# Patient Record
Sex: Female | Born: 1940
Health system: Southern US, Community
[De-identification: ages and names within clinical notes are randomized; demographics above are authoritative.]

## PROBLEM LIST (undated history)

## (undated) DIAGNOSIS — I10 Essential (primary) hypertension: Secondary | ICD-10-CM

## (undated) DIAGNOSIS — E039 Hypothyroidism, unspecified: Secondary | ICD-10-CM

## (undated) DIAGNOSIS — K7581 Nonalcoholic steatohepatitis (NASH): Secondary | ICD-10-CM

## (undated) DIAGNOSIS — K7460 Unspecified cirrhosis of liver: Secondary | ICD-10-CM

## (undated) DIAGNOSIS — C50911 Malignant neoplasm of unspecified site of right female breast: Secondary | ICD-10-CM

## (undated) DIAGNOSIS — E079 Disorder of thyroid, unspecified: Secondary | ICD-10-CM

## (undated) DIAGNOSIS — Z8619 Personal history of other infectious and parasitic diseases: Secondary | ICD-10-CM

## (undated) DIAGNOSIS — R0600 Dyspnea, unspecified: Secondary | ICD-10-CM

## (undated) DIAGNOSIS — K219 Gastro-esophageal reflux disease without esophagitis: Secondary | ICD-10-CM

## (undated) DIAGNOSIS — M1990 Unspecified osteoarthritis, unspecified site: Secondary | ICD-10-CM

## (undated) DIAGNOSIS — E119 Type 2 diabetes mellitus without complications: Secondary | ICD-10-CM

## (undated) DIAGNOSIS — C220 Liver cell carcinoma: Secondary | ICD-10-CM

## (undated) DIAGNOSIS — E785 Hyperlipidemia, unspecified: Secondary | ICD-10-CM

## (undated) DIAGNOSIS — Z9289 Personal history of other medical treatment: Secondary | ICD-10-CM

## (undated) HISTORY — PX: SKIN CANCER EXCISION: SHX779

## (undated) HISTORY — PX: CATARACT EXTRACTION W/ INTRAOCULAR LENS  IMPLANT, BILATERAL: SHX1307

## (undated) HISTORY — PX: OTHER SURGICAL HISTORY: SHX169

## (undated) HISTORY — DX: Personal history of other infectious and parasitic diseases: Z86.19

## (undated) HISTORY — DX: Disorder of thyroid, unspecified: E07.9

## (undated) HISTORY — DX: Hyperlipidemia, unspecified: E78.5

## (undated) HISTORY — DX: Unspecified osteoarthritis, unspecified site: M19.90

## (undated) HISTORY — DX: Essential (primary) hypertension: I10

---

## 1985-02-14 HISTORY — PX: ABDOMINAL HYSTERECTOMY: SHX81

## 2002-02-14 HISTORY — PX: HERNIA REPAIR: SHX51

## 2003-02-15 DIAGNOSIS — C50911 Malignant neoplasm of unspecified site of right female breast: Secondary | ICD-10-CM

## 2003-02-15 HISTORY — PX: BREAST BIOPSY: SHX20

## 2003-02-15 HISTORY — DX: Malignant neoplasm of unspecified site of right female breast: C50.911

## 2003-02-15 HISTORY — PX: BREAST LUMPECTOMY: SHX2

## 2004-01-23 ENCOUNTER — Ambulatory Visit: Admission: RE | Admit: 2004-01-23 | Discharge: 2004-04-05 | Payer: Self-pay | Admitting: Radiation Oncology

## 2010-05-31 ENCOUNTER — Ambulatory Visit (INDEPENDENT_AMBULATORY_CARE_PROVIDER_SITE_OTHER): Payer: Medicare Other | Admitting: Internal Medicine

## 2010-05-31 DIAGNOSIS — K7689 Other specified diseases of liver: Secondary | ICD-10-CM

## 2010-05-31 DIAGNOSIS — B182 Chronic viral hepatitis C: Secondary | ICD-10-CM

## 2010-06-21 ENCOUNTER — Ambulatory Visit (INDEPENDENT_AMBULATORY_CARE_PROVIDER_SITE_OTHER): Payer: Medicare Other | Admitting: Internal Medicine

## 2010-06-21 DIAGNOSIS — R894 Abnormal immunological findings in specimens from other organs, systems and tissues: Secondary | ICD-10-CM

## 2010-06-21 DIAGNOSIS — K7689 Other specified diseases of liver: Secondary | ICD-10-CM

## 2010-06-22 ENCOUNTER — Other Ambulatory Visit (INDEPENDENT_AMBULATORY_CARE_PROVIDER_SITE_OTHER): Payer: Self-pay | Admitting: Internal Medicine

## 2010-06-22 DIAGNOSIS — K7460 Unspecified cirrhosis of liver: Secondary | ICD-10-CM

## 2010-07-05 ENCOUNTER — Ambulatory Visit (HOSPITAL_COMMUNITY)
Admission: RE | Admit: 2010-07-05 | Discharge: 2010-07-05 | Disposition: A | Discharge status: Home / Self Care | Payer: Medicare Other | Source: Ambulatory Visit | Attending: Internal Medicine | Admitting: Internal Medicine

## 2010-07-05 ENCOUNTER — Other Ambulatory Visit (INDEPENDENT_AMBULATORY_CARE_PROVIDER_SITE_OTHER): Payer: Self-pay | Admitting: Internal Medicine

## 2010-07-05 ENCOUNTER — Ambulatory Visit (HOSPITAL_COMMUNITY): Payer: Medicare Other

## 2010-07-05 ENCOUNTER — Other Ambulatory Visit: Payer: Self-pay | Admitting: Diagnostic Radiology

## 2010-07-05 DIAGNOSIS — K7460 Unspecified cirrhosis of liver: Secondary | ICD-10-CM

## 2010-07-05 DIAGNOSIS — Z90710 Acquired absence of both cervix and uterus: Secondary | ICD-10-CM | POA: Insufficient documentation

## 2010-07-05 DIAGNOSIS — C50919 Malignant neoplasm of unspecified site of unspecified female breast: Secondary | ICD-10-CM | POA: Insufficient documentation

## 2010-07-05 DIAGNOSIS — E039 Hypothyroidism, unspecified: Secondary | ICD-10-CM | POA: Insufficient documentation

## 2010-07-05 DIAGNOSIS — E119 Type 2 diabetes mellitus without complications: Secondary | ICD-10-CM | POA: Insufficient documentation

## 2010-07-05 DIAGNOSIS — I10 Essential (primary) hypertension: Secondary | ICD-10-CM | POA: Insufficient documentation

## 2010-07-05 LAB — CBC
HCT: 37.8 % (ref 36.0–46.0)
Platelets: 91 10*3/uL — ABNORMAL LOW (ref 150–400)
RBC: 4.33 MIL/uL (ref 3.87–5.11)
RDW: 14.6 % (ref 11.5–15.5)
WBC: 3.7 10*3/uL — ABNORMAL LOW (ref 4.0–10.5)

## 2010-07-05 LAB — PROTIME-INR: INR: 1.06 (ref 0.00–1.49)

## 2010-07-05 LAB — APTT: aPTT: 31 seconds (ref 24–37)

## 2010-10-14 ENCOUNTER — Encounter (INDEPENDENT_AMBULATORY_CARE_PROVIDER_SITE_OTHER): Payer: Self-pay | Admitting: *Deleted

## 2010-11-25 ENCOUNTER — Encounter (INDEPENDENT_AMBULATORY_CARE_PROVIDER_SITE_OTHER): Payer: Self-pay | Admitting: *Deleted

## 2010-12-09 DIAGNOSIS — R079 Chest pain, unspecified: Secondary | ICD-10-CM

## 2010-12-21 ENCOUNTER — Ambulatory Visit (INDEPENDENT_AMBULATORY_CARE_PROVIDER_SITE_OTHER): Payer: Medicare Other | Admitting: Internal Medicine

## 2011-01-10 ENCOUNTER — Ambulatory Visit: Payer: Medicare Other | Admitting: Endocrinology

## 2011-01-11 ENCOUNTER — Ambulatory Visit (INDEPENDENT_AMBULATORY_CARE_PROVIDER_SITE_OTHER): Payer: Medicare Other | Admitting: Endocrinology

## 2011-01-11 ENCOUNTER — Encounter: Payer: Self-pay | Admitting: Endocrinology

## 2011-01-11 VITALS — BP 138/82 | HR 79 | Temp 98.6°F | Ht 62.0 in | Wt 234.1 lb

## 2011-01-11 DIAGNOSIS — E0590 Thyrotoxicosis, unspecified without thyrotoxic crisis or storm: Secondary | ICD-10-CM | POA: Insufficient documentation

## 2011-01-11 NOTE — Progress Notes (Signed)
  Subjective:    Patient ID: Cheryl Velasquez, female    DOB: 1940-06-29, 70 y.o.   MRN: [id number]  HPI Pt says she took synthroid for an uncertain reason for many years, up until a few mos ago.  She says that after being off synthroid x a few mos, her blood test was "0.37."   Symptomatically, she reports many year of slight tremor of the hands, and assoc anxiety. No past medical history on file.  No past surgical history on file.  History   Social History  . Marital Status: Single    Spouse Name: N/A    Number of Children: N/A  . Years of Education: N/A   Occupational History  . Not on file.   Social History Main Topics  . Smoking status: Never Smoker   . Smokeless tobacco: Not on file  . Alcohol Use: Not on file  . Drug Use: Not on file  . Sexually Active: Not on file   Other Topics Concern  . Not on file   Social History Narrative  . No narrative on file    No current outpatient prescriptions on file prior to visit.    Allergies not on file  No family history on file. Mother and 2 sibs had goiters (benign) BP 138/82  Pulse 79  Temp(Src) 98.6 F (37 C) (Oral)  Ht 5' 2"  (1.575 m)  Wt 234 lb 1.9 oz (106.196 kg)  BMI 42.82 kg/m2  SpO2 95%    Review of Systems denies weight loss, headache, hoarseness, double vision, palpitations, diarrhea, polyuria, myalgias, excessive diaphoresis, numbness, seizure, depression, hypoglycemia, and rhinorrhea.  She reports easy bruising and doe    Objective:   Physical Exam VS: see vs page GEN: no distress.  obese HEAD: head: no deformity eyes: no periorbital swelling, no proptosis external nose and ears are normal mouth: no lesion seen NECK: i am unable to detect any thyroid enlargement.   CHEST WALL: no deformity LUNGS:  Clear to auscultation CV: reg rate and rhythm, no murmur ABD: abdomen is soft, nontender.  no hepatosplenomegaly.  not distended.  no hernia MUSCULOSKELETAL: muscle bulk and strength are grossly normal.   no obvious joint swelling.  gait is normal and steady EXTEMITIES: no deformity of the hand. Trace bilat leg edema PULSES: dorsalis pedis intact bilat.   NEURO:  cn 2-12 grossly intact.   readily moves all 4's.  sensation is intact to touch on the feet.  There is a moderate tremor of the hands. SKIN:  Normal texture and temperature.  No rash or suspicious lesion is visible.   NODES:  None palpable at the neck. PSYCH: alert, oriented x3.  Does not appear anxious nor depressed.      Assessment & Plan:  Multinodular goiter, which is usually hereditary Mild hyperthyroidism, due to the goiter, new.   Tremor, unlikely thyroid-related.

## 2011-01-11 NOTE — Patient Instructions (Signed)
You have an hereditary goiter, which has become mildly overactive.  There is nothing you did to cause this, and it does not go away on its own.  The overactive thyroid increases your risk of atrial fibrillation, osteoporosis, and symptoms (which we listed today).   Let's check a thyroid ultrasound.  you will receive a phone call, about a day and time for an appointment. Based on the results, i hope to order for you a treatment pill of radioactive iodine.  Although it is a larger amount of radiation, you will again notice no symptoms from this.  The pill is gone from your body in a few days (during which you should stay away from other people), but takes several months to work.  Therefore, please return here approximately 6-8 weeks after the treatment.  This treatment has been available for many years, and the only known side-effect is an underactive thyroid.  It is possible that i would eventually prescribe for you a thyroid hormone pill, which is very inexpensive.  You don't have to worry about side-effects of this thyroid hormone pill, because it is the same molecule your thyroid makes.

## 2011-01-12 ENCOUNTER — Encounter: Payer: Self-pay | Admitting: Endocrinology

## 2011-01-12 ENCOUNTER — Telehealth: Payer: Self-pay | Admitting: Endocrinology

## 2011-01-12 DIAGNOSIS — I10 Essential (primary) hypertension: Secondary | ICD-10-CM | POA: Insufficient documentation

## 2011-01-12 DIAGNOSIS — M1990 Unspecified osteoarthritis, unspecified site: Secondary | ICD-10-CM | POA: Insufficient documentation

## 2011-01-12 DIAGNOSIS — E119 Type 2 diabetes mellitus without complications: Secondary | ICD-10-CM | POA: Insufficient documentation

## 2011-01-12 DIAGNOSIS — E785 Hyperlipidemia, unspecified: Secondary | ICD-10-CM | POA: Insufficient documentation

## 2011-01-12 NOTE — Telephone Encounter (Signed)
Received 9 pages from [facility] forwarded to Dr. Loanne Drilling for review 11.28.12 sj

## 2011-01-12 NOTE — Telephone Encounter (Signed)
Received 1 page from [facility]. Forwarded to Dr. Loanne Drilling for review. 01/12/11-ar

## 2011-01-17 ENCOUNTER — Encounter (INDEPENDENT_AMBULATORY_CARE_PROVIDER_SITE_OTHER): Payer: Self-pay | Admitting: Internal Medicine

## 2011-01-17 ENCOUNTER — Ambulatory Visit (INDEPENDENT_AMBULATORY_CARE_PROVIDER_SITE_OTHER): Payer: Medicare Other | Admitting: Internal Medicine

## 2011-01-17 VITALS — BP 130/82 | HR 78 | Temp 98.1°F | Resp 16 | Ht 62.0 in | Wt 235.0 lb

## 2011-01-17 DIAGNOSIS — K7460 Unspecified cirrhosis of liver: Secondary | ICD-10-CM

## 2011-01-17 NOTE — Progress Notes (Signed)
Presenting complaint; Followup for recently diagnosed cirrhosis. Subjective: Cheryl Velasquez is 70 year old Caucasian female was evaluated back in April for positive HCV antibody and CT suggesting cirrhosis. Her HCVRNA was undetectable implying either false positive HCV antibody or prior infection with spontaneous clearence. She had liver biopsy which revealed stage IV fibrosis. Changes were felt to be most consistent with cirrhosis due. to NAFLD. Patient was advised to get better handle on her diabetes, lose weight and exercise regularly. She states she has changed her eating habits and trying to be more active physically. He has lost 11 pounds since her last visit. She states she recently had exercise tolerance test and it was normal. She was recently diagnosed with toxic goiter by Dr. Renato Shin and scheduled to undergo ablative therapy with radioiodine in the next 2 weeks. She denies abdominal pain or problems with fluid retention. He states her hemoglobin A1c was down from 9.2 to around 8. Current Medications: Current Outpatient Prescriptions  Medication Sig Dispense Refill  . ALPRAZolam (XANAX) 0.5 MG tablet Take 0.5 mg by mouth daily as needed.        Marland Kitchen aspirin (BAYER LOW DOSE) 81 MG EC tablet Take 81 mg by mouth daily. Swallow whole.       Marland Kitchen atenolol (TENORMIN) 50 MG tablet Take 50 mg by mouth daily.        . Calcium Carbonate-Vitamin D (CALTRATE 600+D) 600-400 MG-UNIT per tablet Take 1 tablet by mouth daily.        . captopril (CAPOTEN) 25 MG tablet Take 25 mg by mouth 2 (two) times daily.        . Cholecalciferol (VITAMIN D-3 PO) Take 1,000 Units by mouth.        . fish oil-omega-3 fatty acids 1000 MG capsule Take 1 capsule by mouth daily.        Marland Kitchen glipiZIDE (GLUCOTROL XL) 5 MG 24 hr tablet Take 5 mg by mouth daily.        Marland Kitchen ibuprofen (ADVIL,MOTRIN) 200 MG tablet Take 200 mg by mouth. Patient takes 1 by mouth in the morning and 1 by mouth in the evening       . metFORMIN (GLUCOPHAGE-XR) 500 MG  24 hr tablet 4 tablets by mouth daily       . Multiple Vitamins-Minerals (CENTRUM SILVER PO) Take 1 tablet by mouth daily.        . simvastatin (ZOCOR) 40 MG tablet Take 40 mg by mouth daily.        Marland Kitchen ZOSTAVAX 21224 UNT/0.65ML injection Inject 0.65 mLs into the skin once.         Objective: BP 130/82  Pulse 78  Temp(Src) 98.1 F (36.7 C) (Oral)  Resp 16  Ht 5' 2"  (1.575 m)  Wt 235 lb (106.595 kg)  BMI 42.98 kg/m2 She is alert and does not have asterixis. Conjunctiva is pink. Sclerae is nonicteric No neck masses or thyromegaly noted. He does not have spider angiomata. Abdomen is obese soft and nontender. Difficult to palpate her liver and/or spleen. No peripheral edema noted.   Assessment: Biopsy proven stage IV fibrosis most likely secondary to NAFLD. M2 mitochondrial antibody was positive at a low titer but no evidence of granulomas on liver biopsy. Therefore this antibody would be nonspecific So far she has preserved hepatic function. She can delay sequelae of her cirrhosis by regular exercise, gradual weight loss and improved control of diabetes.   Plan: She'll have LFTs with next blood work either this or next month. She  will undergo EGD looking for esophageal varices in February or March next year and she is through with her treatment for thyroid problems. Office visit in 6 months.

## 2011-01-17 NOTE — Patient Instructions (Signed)
LFTs with next blood draw. Please call us in February or March next year to schedule EGD to look for esophageal varices.

## 2011-01-18 ENCOUNTER — Telehealth: Payer: Self-pay | Admitting: Endocrinology

## 2011-01-18 ENCOUNTER — Telehealth: Payer: Self-pay | Admitting: *Deleted

## 2011-01-18 DIAGNOSIS — E042 Nontoxic multinodular goiter: Secondary | ICD-10-CM | POA: Insufficient documentation

## 2011-01-18 NOTE — Telephone Encounter (Signed)
Pt would like MD to call her regarding US thyroid biopsy and pt would like procedure to be done at [facility].

## 2011-01-18 NOTE — Telephone Encounter (Signed)
i called pt 01/18/11.  i gave [place] results.  i ordered us-guided bx.  If benign, as expected, i'll order i-131 rx

## 2011-01-18 NOTE — Telephone Encounter (Signed)
Patient called back and requesting to speak w/Dr Loanne Drilling.

## 2011-01-19 NOTE — Telephone Encounter (Signed)
i called pt twice on 01/19/11.  Busy both times.

## 2011-01-20 NOTE — Telephone Encounter (Signed)
i called pt 01/20/11.  i lfet message.  Please call-back with any questions about the bx

## 2011-01-21 ENCOUNTER — Encounter: Payer: Self-pay | Admitting: Endocrinology

## 2011-01-21 ENCOUNTER — Telehealth: Payer: Self-pay | Admitting: *Deleted

## 2011-01-21 NOTE — Telephone Encounter (Signed)
Pt had US done on 01/14/2011 at [place], pt has an order in for another [place] on Jan 20, 2011- Pt does not need another [place]. Have you received report from [place] on 01/14/2011? Sherol Dade with [facility] [phone]

## 2011-01-21 NOTE — Telephone Encounter (Signed)
i called pt 01/21/11.  You had [place].  Next is us-guided bx.

## 2011-01-21 NOTE — Telephone Encounter (Signed)
Pt also called back requesting callback from MD.

## 2011-01-28 ENCOUNTER — Ambulatory Visit: Payer: Medicare Other | Admitting: Endocrinology

## 2011-02-09 ENCOUNTER — Encounter: Payer: Self-pay | Admitting: Endocrinology

## 2011-02-19 ENCOUNTER — Telehealth: Payer: Self-pay | Admitting: Endocrinology

## 2011-02-19 NOTE — Telephone Encounter (Signed)
please call patient: bx is benign--good i need a most recent tsh result before i can order i-131 rx.  Please ask dr daniel's office to send to [place].

## 2011-02-21 NOTE — Telephone Encounter (Signed)
i believe dr Quillian Quince has already done thyroid blood test.  Please ask that result be faxed here

## 2011-02-21 NOTE — Telephone Encounter (Signed)
Pt informed of results of thyroid bx. She states she will call Dr. Arcola Jansky office to have her thyroid blood test done.

## 2011-02-21 NOTE — Telephone Encounter (Signed)
Left message for pt to callback office.  

## 2011-02-22 ENCOUNTER — Encounter: Payer: Self-pay | Admitting: Endocrinology

## 2011-02-22 DIAGNOSIS — N6489 Other specified disorders of breast: Secondary | ICD-10-CM | POA: Diagnosis not present

## 2011-02-22 DIAGNOSIS — Z853 Personal history of malignant neoplasm of breast: Secondary | ICD-10-CM | POA: Diagnosis not present

## 2011-02-22 DIAGNOSIS — M899 Disorder of bone, unspecified: Secondary | ICD-10-CM | POA: Diagnosis not present

## 2011-02-22 DIAGNOSIS — E119 Type 2 diabetes mellitus without complications: Secondary | ICD-10-CM | POA: Diagnosis not present

## 2011-02-22 DIAGNOSIS — K7460 Unspecified cirrhosis of liver: Secondary | ICD-10-CM | POA: Diagnosis not present

## 2011-02-22 DIAGNOSIS — Z7982 Long term (current) use of aspirin: Secondary | ICD-10-CM | POA: Diagnosis not present

## 2011-02-22 DIAGNOSIS — I10 Essential (primary) hypertension: Secondary | ICD-10-CM | POA: Diagnosis not present

## 2011-02-22 DIAGNOSIS — R928 Other abnormal and inconclusive findings on diagnostic imaging of breast: Secondary | ICD-10-CM | POA: Diagnosis not present

## 2011-02-22 DIAGNOSIS — Z09 Encounter for follow-up examination after completed treatment for conditions other than malignant neoplasm: Secondary | ICD-10-CM | POA: Diagnosis not present

## 2011-02-22 DIAGNOSIS — E039 Hypothyroidism, unspecified: Secondary | ICD-10-CM | POA: Diagnosis not present

## 2011-02-22 DIAGNOSIS — Z79899 Other long term (current) drug therapy: Secondary | ICD-10-CM | POA: Diagnosis not present

## 2011-02-22 DIAGNOSIS — E785 Hyperlipidemia, unspecified: Secondary | ICD-10-CM | POA: Diagnosis not present

## 2011-02-22 NOTE — Telephone Encounter (Signed)
Contacted Dr. Arcola Jansky office. They will fax labs done today to office for MD to review.

## 2011-03-03 ENCOUNTER — Telehealth (INDEPENDENT_AMBULATORY_CARE_PROVIDER_SITE_OTHER): Payer: Self-pay | Admitting: *Deleted

## 2011-03-03 NOTE — Telephone Encounter (Signed)
This is for [facility]

## 2011-03-03 NOTE — Telephone Encounter (Signed)
Patient called and told that her SGOT this lab was 45 , in February 2012 the result was 50 SGPT this lab was 32, in February 2012 the result was 42. Both have come down and we will repeat in 3 months.

## 2011-03-03 NOTE — Telephone Encounter (Signed)
LM stating Dr. Laural Golden call with her lab results. She would like for someone to return the call and let her know if they are bad or good. The return phone number is [phone].

## 2011-03-03 NOTE — Telephone Encounter (Signed)
Per NUR the patient will need a repeat lab in in 3 months. This has been noted for April

## 2011-03-09 DIAGNOSIS — Z09 Encounter for follow-up examination after completed treatment for conditions other than malignant neoplasm: Secondary | ICD-10-CM | POA: Diagnosis not present

## 2011-03-09 DIAGNOSIS — I10 Essential (primary) hypertension: Secondary | ICD-10-CM | POA: Diagnosis not present

## 2011-03-09 DIAGNOSIS — E785 Hyperlipidemia, unspecified: Secondary | ICD-10-CM | POA: Diagnosis not present

## 2011-03-09 DIAGNOSIS — Z853 Personal history of malignant neoplasm of breast: Secondary | ICD-10-CM | POA: Diagnosis not present

## 2011-03-09 DIAGNOSIS — K7460 Unspecified cirrhosis of liver: Secondary | ICD-10-CM | POA: Diagnosis not present

## 2011-03-09 DIAGNOSIS — E039 Hypothyroidism, unspecified: Secondary | ICD-10-CM | POA: Diagnosis not present

## 2011-03-09 DIAGNOSIS — M899 Disorder of bone, unspecified: Secondary | ICD-10-CM | POA: Diagnosis not present

## 2011-03-09 DIAGNOSIS — E119 Type 2 diabetes mellitus without complications: Secondary | ICD-10-CM | POA: Diagnosis not present

## 2011-03-09 DIAGNOSIS — M949 Disorder of cartilage, unspecified: Secondary | ICD-10-CM | POA: Diagnosis not present

## 2011-03-10 ENCOUNTER — Telehealth: Payer: Self-pay | Admitting: *Deleted

## 2011-03-10 DIAGNOSIS — E0590 Thyrotoxicosis, unspecified without thyrotoxic crisis or storm: Secondary | ICD-10-CM

## 2011-03-10 NOTE — Telephone Encounter (Signed)
Pt wants to know if MD will order I-131 tx for thyroid

## 2011-03-10 NOTE — Telephone Encounter (Signed)
Pt called requesting callback concerning thyroid biopsy and radiation iodine therapy. Left message for pt to callback office.

## 2011-03-11 NOTE — Telephone Encounter (Signed)
i need tsh result in order to go ahead.  Was it done at dr [place] office?

## 2011-03-16 NOTE — Telephone Encounter (Signed)
Requested results from Dr. Arbie Cookey received-placed on MD's desk for review.

## 2011-03-20 NOTE — Telephone Encounter (Signed)
Probably in scanning pile.  Please retrieve

## 2011-03-22 DIAGNOSIS — Z23 Encounter for immunization: Secondary | ICD-10-CM | POA: Diagnosis not present

## 2011-03-22 DIAGNOSIS — E782 Mixed hyperlipidemia: Secondary | ICD-10-CM | POA: Diagnosis not present

## 2011-03-22 DIAGNOSIS — F411 Generalized anxiety disorder: Secondary | ICD-10-CM | POA: Diagnosis not present

## 2011-03-22 DIAGNOSIS — I10 Essential (primary) hypertension: Secondary | ICD-10-CM | POA: Diagnosis not present

## 2011-03-22 DIAGNOSIS — B372 Candidiasis of skin and nail: Secondary | ICD-10-CM | POA: Diagnosis not present

## 2011-03-22 DIAGNOSIS — IMO0001 Reserved for inherently not codable concepts without codable children: Secondary | ICD-10-CM | POA: Diagnosis not present

## 2011-03-22 DIAGNOSIS — E039 Hypothyroidism, unspecified: Secondary | ICD-10-CM | POA: Diagnosis not present

## 2011-03-22 DIAGNOSIS — IMO0002 Reserved for concepts with insufficient information to code with codable children: Secondary | ICD-10-CM | POA: Diagnosis not present

## 2011-03-22 NOTE — Telephone Encounter (Signed)
Result resent with TSH results placed on MD's desk for review

## 2011-03-23 NOTE — Telephone Encounter (Signed)
please call patient: This blood test is normal We will need to call [facility]  Do they have any other tsh results?

## 2011-03-24 NOTE — Telephone Encounter (Signed)
Pt informed of results. Will contact [facility] to see if they have other TSH results.

## 2011-03-31 NOTE — Telephone Encounter (Signed)
R'cd fax from [facility] of lab results. Placed on MD's desk to review.

## 2011-04-01 NOTE — Telephone Encounter (Signed)
Pt informed of referral and of MD's advisement to return after I-131 tx.

## 2011-04-01 NOTE — Telephone Encounter (Signed)
Left message for pt to callback office.  

## 2011-04-01 NOTE — Telephone Encounter (Signed)
Got it.  i ordered i-131 rx Ret 2 mos later

## 2011-04-25 ENCOUNTER — Telehealth: Payer: Self-pay | Admitting: Endocrinology

## 2011-04-25 NOTE — Telephone Encounter (Signed)
The pt called and is hoping to get a call back regarding a iodine pill.  Thanks!

## 2011-04-26 NOTE — Telephone Encounter (Signed)
Left message for pt to callback office.  

## 2011-04-26 NOTE — Telephone Encounter (Signed)
Pt is requesting information about appointment for I-131 treatment. Transferred to [facility] regarding referral.

## 2011-05-05 ENCOUNTER — Encounter (HOSPITAL_COMMUNITY)
Admission: RE | Admit: 2011-05-05 | Discharge: 2011-05-05 | Disposition: A | Discharge status: Home / Self Care | Payer: Medicare Other | Source: Ambulatory Visit | Attending: Endocrinology | Admitting: Endocrinology

## 2011-05-05 ENCOUNTER — Encounter (HOSPITAL_COMMUNITY): Payer: Self-pay

## 2011-05-05 DIAGNOSIS — E0520 Thyrotoxicosis with toxic multinodular goiter without thyrotoxic crisis or storm: Secondary | ICD-10-CM | POA: Diagnosis not present

## 2011-05-05 DIAGNOSIS — E0590 Thyrotoxicosis, unspecified without thyrotoxic crisis or storm: Secondary | ICD-10-CM | POA: Insufficient documentation

## 2011-05-05 DIAGNOSIS — E042 Nontoxic multinodular goiter: Secondary | ICD-10-CM | POA: Diagnosis not present

## 2011-05-05 MED ORDER — SODIUM IODIDE I 131 CAPSULE
45.0000 | Freq: Once | INTRAVENOUS | Status: AC | PRN
  Administered 2011-05-05: 45 via ORAL

## 2011-05-06 ENCOUNTER — Encounter (INDEPENDENT_AMBULATORY_CARE_PROVIDER_SITE_OTHER): Payer: Self-pay | Admitting: *Deleted

## 2011-05-30 ENCOUNTER — Other Ambulatory Visit (INDEPENDENT_AMBULATORY_CARE_PROVIDER_SITE_OTHER): Payer: Self-pay | Admitting: Internal Medicine

## 2011-05-30 LAB — HEPATIC FUNCTION PANEL
ALT: 24 U/L (ref 0–35)
Alkaline Phosphatase: 60 U/L (ref 39–117)
Bilirubin, Direct: 0.2 mg/dL (ref 0.0–0.3)
Indirect Bilirubin: 0.4 mg/dL (ref 0.0–0.9)

## 2011-07-04 ENCOUNTER — Encounter: Payer: Self-pay | Admitting: Endocrinology

## 2011-07-04 ENCOUNTER — Other Ambulatory Visit (INDEPENDENT_AMBULATORY_CARE_PROVIDER_SITE_OTHER): Payer: Medicare Other

## 2011-07-04 ENCOUNTER — Ambulatory Visit (INDEPENDENT_AMBULATORY_CARE_PROVIDER_SITE_OTHER): Payer: Medicare Other | Admitting: Endocrinology

## 2011-07-04 VITALS — BP 142/78 | HR 73 | Temp 97.8°F | Ht 61.0 in | Wt 235.0 lb

## 2011-07-04 DIAGNOSIS — E0590 Thyrotoxicosis, unspecified without thyrotoxic crisis or storm: Secondary | ICD-10-CM | POA: Diagnosis not present

## 2011-07-04 NOTE — Progress Notes (Signed)
Subjective:    Patient ID: Cheryl Velasquez, female    DOB: Dec 08, 1940, 71 y.o.   MRN: [id number]  HPI Pt is now 2 mos s/p i-131 rx for hyperthyroidism due to multinodular goiter.  pt states she feels well in general, except for fatigue Past Medical History  Diagnosis Date  . History of breast cancer   . History of chicken pox   . Arthritis   . Diabetes mellitus   . Hyperlipidemia   . Hypertension   . Thyroid disease   . Cirrhosis     Past Surgical History  Procedure Date  . Hernia repair 2004  . Abdominal hysterectomy 1987  . Breast biopsy 2005    History   Social History  . Marital Status: Single    Spouse Name: N/A    Number of Children: 0  . Years of Education: 10   Occupational History  . Textiles    Social History Main Topics  . Smoking status: Never Smoker   . Smokeless tobacco: Never Used  . Alcohol Use: No  . Drug Use: No  . Sexually Active: Not on file   Other Topics Concern  . Not on file   Social History Narrative   Regular Exercise-no    Current Outpatient Prescriptions on File Prior to Visit  Medication Sig Dispense Refill  . ALPRAZolam (XANAX) 0.5 MG tablet Take 0.5 mg by mouth daily as needed.        Marland Kitchen aspirin (BAYER LOW DOSE) 81 MG EC tablet Take 81 mg by mouth daily. Swallow whole.       Marland Kitchen atenolol (TENORMIN) 50 MG tablet Take 50 mg by mouth daily.        . Calcium Carbonate-Vitamin D (CALTRATE 600+D) 600-400 MG-UNIT per tablet Take 1 tablet by mouth daily.        . captopril (CAPOTEN) 25 MG tablet Take 25 mg by mouth 2 (two) times daily.        . Cholecalciferol (VITAMIN D-3 PO) Take 1,000 Units by mouth.        . fish oil-omega-3 fatty acids 1000 MG capsule Take 1 capsule by mouth daily.        Marland Kitchen glipiZIDE (GLUCOTROL XL) 5 MG 24 hr tablet Take 5 mg by mouth daily.        Marland Kitchen ibuprofen (ADVIL,MOTRIN) 200 MG tablet Take 200 mg by mouth. Patient takes 1 by mouth in the morning and 1 by mouth in the evening       . metFORMIN (GLUCOPHAGE-XR) 500  MG 24 hr tablet 4 tablets by mouth daily       . Multiple Vitamins-Minerals (CENTRUM SILVER PO) Take 1 tablet by mouth daily.        . simvastatin (ZOCOR) 40 MG tablet Take 40 mg by mouth daily.          No Known Allergies  Family History  Problem Relation Age of Onset  . Cancer Other     Colon Cancer, Prostate Cancer-Other Blood Relative  . Hyperlipidemia Other     Other Blood Relative  . Stroke Other     Parent, Other Blood Relative  . Diabetes Other     Parent, Other Blood Relative  . Hypertension Other     Parent  . Prostate cancer Maternal Aunt   . Dementia Maternal Aunt   . Dementia Sister   . Dementia Sister   . Liver disease Brother     BP 142/78  Pulse 73  Temp(Src) 97.8 F (  36.6 C) (Oral)  Ht 5' 1"  (1.549 m)  Wt 235 lb (106.595 kg)  BMI 44.40 kg/m2  SpO2 95%    Review of Systems Denies weight change    Objective:   Physical Exam VITAL SIGNS:  See vs page GENERAL: no distress NECK: There is no palpable thyroid enlargement.  No thyroid nodule is palpable.  No palpable lymphadenopathy at the anterior neck.  Lab Results  Component Value Date   TSH 0.14* 07/04/2011      Assessment & Plan:  Hyperthyroidism, not better yet

## 2011-07-04 NOTE — Patient Instructions (Signed)
blood tests are being requested for you today.  You will receive a letter with results. If you have the thyroid blood tests in June, you can wait until 6 weeks after that to come back here.

## 2011-07-05 ENCOUNTER — Telehealth: Payer: Self-pay | Admitting: *Deleted

## 2011-07-05 ENCOUNTER — Encounter: Payer: Self-pay | Admitting: Endocrinology

## 2011-07-05 LAB — T4, FREE: Free T4: 0.92 ng/dL (ref 0.60–1.60)

## 2011-07-05 NOTE — Telephone Encounter (Signed)
Called pt to inform of lab results, left message for pt to callback (letter also mailed to pt).

## 2011-07-06 NOTE — Telephone Encounter (Signed)
Pt informed of lab results. 

## 2011-07-28 ENCOUNTER — Encounter (INDEPENDENT_AMBULATORY_CARE_PROVIDER_SITE_OTHER): Payer: Self-pay | Admitting: *Deleted

## 2011-08-02 DIAGNOSIS — E782 Mixed hyperlipidemia: Secondary | ICD-10-CM | POA: Diagnosis not present

## 2011-08-02 DIAGNOSIS — E039 Hypothyroidism, unspecified: Secondary | ICD-10-CM | POA: Diagnosis not present

## 2011-08-02 DIAGNOSIS — IMO0001 Reserved for inherently not codable concepts without codable children: Secondary | ICD-10-CM | POA: Diagnosis not present

## 2011-08-02 DIAGNOSIS — I10 Essential (primary) hypertension: Secondary | ICD-10-CM | POA: Diagnosis not present

## 2011-08-09 DIAGNOSIS — IMO0002 Reserved for concepts with insufficient information to code with codable children: Secondary | ICD-10-CM | POA: Diagnosis not present

## 2011-08-09 DIAGNOSIS — E782 Mixed hyperlipidemia: Secondary | ICD-10-CM | POA: Diagnosis not present

## 2011-08-09 DIAGNOSIS — F411 Generalized anxiety disorder: Secondary | ICD-10-CM | POA: Diagnosis not present

## 2011-08-09 DIAGNOSIS — E039 Hypothyroidism, unspecified: Secondary | ICD-10-CM | POA: Diagnosis not present

## 2011-08-09 DIAGNOSIS — I10 Essential (primary) hypertension: Secondary | ICD-10-CM | POA: Diagnosis not present

## 2011-08-12 ENCOUNTER — Telehealth (INDEPENDENT_AMBULATORY_CARE_PROVIDER_SITE_OTHER): Payer: Self-pay | Admitting: *Deleted

## 2011-08-12 ENCOUNTER — Encounter (INDEPENDENT_AMBULATORY_CARE_PROVIDER_SITE_OTHER): Payer: Self-pay

## 2011-08-12 NOTE — Telephone Encounter (Signed)
Per Dr.Rehman the patient will need to have LFT prior to her office visit 12/05/11.

## 2011-09-03 DIAGNOSIS — L02419 Cutaneous abscess of limb, unspecified: Secondary | ICD-10-CM | POA: Diagnosis not present

## 2011-09-05 DIAGNOSIS — Z923 Personal history of irradiation: Secondary | ICD-10-CM | POA: Diagnosis not present

## 2011-09-05 DIAGNOSIS — Z09 Encounter for follow-up examination after completed treatment for conditions other than malignant neoplasm: Secondary | ICD-10-CM | POA: Diagnosis not present

## 2011-09-05 DIAGNOSIS — M899 Disorder of bone, unspecified: Secondary | ICD-10-CM | POA: Diagnosis not present

## 2011-09-05 DIAGNOSIS — E039 Hypothyroidism, unspecified: Secondary | ICD-10-CM | POA: Diagnosis not present

## 2011-09-05 DIAGNOSIS — Z9889 Other specified postprocedural states: Secondary | ICD-10-CM | POA: Diagnosis not present

## 2011-09-05 DIAGNOSIS — Z853 Personal history of malignant neoplasm of breast: Secondary | ICD-10-CM | POA: Diagnosis not present

## 2011-09-05 DIAGNOSIS — M949 Disorder of cartilage, unspecified: Secondary | ICD-10-CM | POA: Diagnosis not present

## 2011-09-05 DIAGNOSIS — Z79899 Other long term (current) drug therapy: Secondary | ICD-10-CM | POA: Diagnosis not present

## 2011-09-05 DIAGNOSIS — E119 Type 2 diabetes mellitus without complications: Secondary | ICD-10-CM | POA: Diagnosis not present

## 2011-09-05 DIAGNOSIS — Z780 Asymptomatic menopausal state: Secondary | ICD-10-CM | POA: Diagnosis not present

## 2011-09-05 DIAGNOSIS — Z7982 Long term (current) use of aspirin: Secondary | ICD-10-CM | POA: Diagnosis not present

## 2011-09-05 DIAGNOSIS — E785 Hyperlipidemia, unspecified: Secondary | ICD-10-CM | POA: Diagnosis not present

## 2011-09-06 DIAGNOSIS — Z09 Encounter for follow-up examination after completed treatment for conditions other than malignant neoplasm: Secondary | ICD-10-CM | POA: Diagnosis not present

## 2011-09-06 DIAGNOSIS — Z853 Personal history of malignant neoplasm of breast: Secondary | ICD-10-CM | POA: Diagnosis not present

## 2011-09-06 DIAGNOSIS — E039 Hypothyroidism, unspecified: Secondary | ICD-10-CM | POA: Diagnosis not present

## 2011-09-06 DIAGNOSIS — E785 Hyperlipidemia, unspecified: Secondary | ICD-10-CM | POA: Diagnosis not present

## 2011-09-06 DIAGNOSIS — N6489 Other specified disorders of breast: Secondary | ICD-10-CM | POA: Diagnosis not present

## 2011-09-06 DIAGNOSIS — M899 Disorder of bone, unspecified: Secondary | ICD-10-CM | POA: Diagnosis not present

## 2011-09-06 DIAGNOSIS — E119 Type 2 diabetes mellitus without complications: Secondary | ICD-10-CM | POA: Diagnosis not present

## 2011-09-08 DIAGNOSIS — S9030XA Contusion of unspecified foot, initial encounter: Secondary | ICD-10-CM | POA: Diagnosis not present

## 2011-09-12 ENCOUNTER — Ambulatory Visit (INDEPENDENT_AMBULATORY_CARE_PROVIDER_SITE_OTHER): Payer: Medicare Other | Admitting: Internal Medicine

## 2011-09-13 ENCOUNTER — Encounter: Payer: Medicare Other | Admitting: Hematology and Oncology

## 2011-09-13 DIAGNOSIS — C50919 Malignant neoplasm of unspecified site of unspecified female breast: Secondary | ICD-10-CM

## 2011-09-13 DIAGNOSIS — M949 Disorder of cartilage, unspecified: Secondary | ICD-10-CM

## 2011-09-13 DIAGNOSIS — M899 Disorder of bone, unspecified: Secondary | ICD-10-CM

## 2011-09-19 ENCOUNTER — Ambulatory Visit (INDEPENDENT_AMBULATORY_CARE_PROVIDER_SITE_OTHER): Payer: Medicare Other | Admitting: Endocrinology

## 2011-09-19 ENCOUNTER — Encounter: Payer: Self-pay | Admitting: Endocrinology

## 2011-09-19 ENCOUNTER — Other Ambulatory Visit (INDEPENDENT_AMBULATORY_CARE_PROVIDER_SITE_OTHER): Payer: Medicare Other

## 2011-09-19 VITALS — BP 128/80 | HR 72 | Temp 98.0°F | Ht 62.0 in | Wt 235.0 lb

## 2011-09-19 DIAGNOSIS — E0590 Thyrotoxicosis, unspecified without thyrotoxic crisis or storm: Secondary | ICD-10-CM | POA: Diagnosis not present

## 2011-09-19 LAB — TSH: TSH: 0.91 u[IU]/mL (ref 0.35–5.50)

## 2011-09-19 NOTE — Patient Instructions (Addendum)
blood tests are being requested for you today.  You will receive a letter with results. Please come back for a follow-up appointment in 2-3 months.

## 2011-09-19 NOTE — Progress Notes (Signed)
Subjective:    Patient ID: Cheryl Velasquez, female    DOB: 01-26-1941, 71 y.o.   MRN: [id number]  HPI Pt is now 4 1/2 mos s/p i-131 rx for hyperthyroidism due to multinodular goiter.  pt states she feels well in general, except for ongoing fatigue. Past Medical History  Diagnosis Date  . History of breast cancer   . History of chicken pox   . Arthritis   . Diabetes mellitus   . Hyperlipidemia   . Hypertension   . Thyroid disease   . Cirrhosis     Past Surgical History  Procedure Date  . Hernia repair 2004  . Abdominal hysterectomy 1987  . Breast biopsy 2005    History   Social History  . Marital Status: Single    Spouse Name: N/A    Number of Children: 0  . Years of Education: 10   Occupational History  . Textiles    Social History Main Topics  . Smoking status: Never Smoker   . Smokeless tobacco: Never Used  . Alcohol Use: No  . Drug Use: No  . Sexually Active: Not on file   Other Topics Concern  . Not on file   Social History Narrative   Regular Exercise-no    Current Outpatient Prescriptions on File Prior to Visit  Medication Sig Dispense Refill  . ALPRAZolam (XANAX) 0.5 MG tablet Take 0.5 mg by mouth daily as needed.        Marland Kitchen aspirin (BAYER LOW DOSE) 81 MG EC tablet Take 81 mg by mouth daily. Swallow whole.       Marland Kitchen atenolol (TENORMIN) 50 MG tablet Take 50 mg by mouth daily.        . Calcium Carbonate-Vitamin D (CALTRATE 600+D) 600-400 MG-UNIT per tablet Take 1 tablet by mouth daily.        . captopril (CAPOTEN) 25 MG tablet Take 25 mg by mouth 2 (two) times daily.        . Cholecalciferol (VITAMIN D-3 PO) Take 1,000 Units by mouth.        . fish oil-omega-3 fatty acids 1000 MG capsule Take 1 capsule by mouth daily.        Marland Kitchen glipiZIDE (GLUCOTROL XL) 5 MG 24 hr tablet Take 5 mg by mouth daily.        Marland Kitchen ibuprofen (ADVIL,MOTRIN) 200 MG tablet Take 200 mg by mouth. Patient takes 1 by mouth in the morning and 1 by mouth in the evening       . metFORMIN  (GLUCOPHAGE-XR) 500 MG 24 hr tablet 4 tablets by mouth daily       . Multiple Vitamins-Minerals (CENTRUM SILVER PO) Take 1 tablet by mouth daily.        . simvastatin (ZOCOR) 40 MG tablet Take 40 mg by mouth daily.          No Known Allergies  Family History  Problem Relation Age of Onset  . Cancer Other     Colon Cancer, Prostate Cancer-Other Blood Relative  . Hyperlipidemia Other     Other Blood Relative  . Stroke Other     Parent, Other Blood Relative  . Diabetes Other     Parent, Other Blood Relative  . Hypertension Other     Parent  . Prostate cancer Maternal Aunt   . Dementia Maternal Aunt   . Dementia Sister   . Dementia Sister   . Liver disease Brother     BP 128/80  Pulse 72  Temp  14 F (36.7 C) (Oral)  Ht 5' 2"  (1.575 m)  Wt 235 lb (106.595 kg)  BMI 42.98 kg/m2  SpO2 94%  Review of Systems Denies weight change    Objective:   Physical Exam VITAL SIGNS:  See vs page GENERAL: no distress NECK: There is no palpable thyroid enlargement.  No thyroid nodule is palpable.  No palpable lymphadenopathy at the anterior neck.   Lab Results  Component Value Date   TSH 0.91 09/19/2011      Assessment & Plan:  Hyperthyroidism, resolved with i-131 rx

## 2011-09-21 ENCOUNTER — Telehealth: Payer: Self-pay | Admitting: *Deleted

## 2011-09-21 NOTE — Telephone Encounter (Signed)
Called pt to inform of lab results, pt informed (letter also mailed to pt). 

## 2011-09-27 DIAGNOSIS — Z85828 Personal history of other malignant neoplasm of skin: Secondary | ICD-10-CM | POA: Diagnosis not present

## 2011-09-27 DIAGNOSIS — L820 Inflamed seborrheic keratosis: Secondary | ICD-10-CM | POA: Diagnosis not present

## 2011-09-27 DIAGNOSIS — C44621 Squamous cell carcinoma of skin of unspecified upper limb, including shoulder: Secondary | ICD-10-CM | POA: Diagnosis not present

## 2011-09-27 DIAGNOSIS — D485 Neoplasm of uncertain behavior of skin: Secondary | ICD-10-CM | POA: Diagnosis not present

## 2011-09-27 DIAGNOSIS — L570 Actinic keratosis: Secondary | ICD-10-CM | POA: Diagnosis not present

## 2011-10-06 DIAGNOSIS — C44621 Squamous cell carcinoma of skin of unspecified upper limb, including shoulder: Secondary | ICD-10-CM | POA: Diagnosis not present

## 2011-11-03 DIAGNOSIS — D0420 Carcinoma in situ of skin of unspecified ear and external auricular canal: Secondary | ICD-10-CM | POA: Diagnosis not present

## 2011-11-03 DIAGNOSIS — C44319 Basal cell carcinoma of skin of other parts of face: Secondary | ICD-10-CM | POA: Diagnosis not present

## 2011-11-03 DIAGNOSIS — L821 Other seborrheic keratosis: Secondary | ICD-10-CM | POA: Diagnosis not present

## 2011-11-03 DIAGNOSIS — D2330 Other benign neoplasm of skin of unspecified part of face: Secondary | ICD-10-CM | POA: Diagnosis not present

## 2011-11-03 DIAGNOSIS — L570 Actinic keratosis: Secondary | ICD-10-CM | POA: Diagnosis not present

## 2011-11-10 ENCOUNTER — Other Ambulatory Visit (INDEPENDENT_AMBULATORY_CARE_PROVIDER_SITE_OTHER): Payer: Self-pay | Admitting: *Deleted

## 2011-11-10 ENCOUNTER — Encounter (INDEPENDENT_AMBULATORY_CARE_PROVIDER_SITE_OTHER): Payer: Self-pay | Admitting: *Deleted

## 2011-11-14 DIAGNOSIS — Z23 Encounter for immunization: Secondary | ICD-10-CM | POA: Diagnosis not present

## 2011-11-28 LAB — HEPATIC FUNCTION PANEL
Albumin: 3.6 g/dL (ref 3.5–5.2)
Indirect Bilirubin: 0.7 mg/dL (ref 0.0–0.9)
Total Protein: 6.9 g/dL (ref 6.0–8.3)

## 2011-12-05 ENCOUNTER — Ambulatory Visit (INDEPENDENT_AMBULATORY_CARE_PROVIDER_SITE_OTHER): Payer: Medicare Other | Admitting: Internal Medicine

## 2011-12-05 ENCOUNTER — Encounter (INDEPENDENT_AMBULATORY_CARE_PROVIDER_SITE_OTHER): Payer: Self-pay | Admitting: Internal Medicine

## 2011-12-05 VITALS — BP 128/80 | HR 82 | Temp 98.8°F | Resp 18 | Ht 62.0 in | Wt 236.1 lb

## 2011-12-05 DIAGNOSIS — E669 Obesity, unspecified: Secondary | ICD-10-CM | POA: Diagnosis not present

## 2011-12-05 DIAGNOSIS — K7460 Unspecified cirrhosis of liver: Secondary | ICD-10-CM | POA: Diagnosis not present

## 2011-12-05 NOTE — Patient Instructions (Signed)
Please remember you need to start walking at least 4 times a week; Start with one mile each time and gradually increase to 2 miles. Physician will contact you with results of ultrasound and blood work

## 2011-12-05 NOTE — Progress Notes (Signed)
Presenting complaint;  Followup for cirrhosis secondary to NAFLD.  Subjective:  Patient is 71 year old Caucasian female who has biopsy-proven stage IV cirrhosis secondary to NAFLD who is here for scheduled visit. She was last seen in December 2012. Patient was advised to exercise or walk regularly and try to lose weight slowly. She states she's been busy taking care of her sister and unable to walk or exercise on regular basis. She feels well. She denies abdominal pain nausea vomiting melena or rectal bleeding. She remains with very good appetite. She denies lower extremity edema.  Current Medications: Current Outpatient Prescriptions  Medication Sig Dispense Refill  . ALPRAZolam (XANAX) 0.5 MG tablet Take 0.5 mg by mouth daily as needed.        Marland Kitchen aspirin (BAYER LOW DOSE) 81 MG EC tablet Take 81 mg by mouth daily. Swallow whole.       Marland Kitchen atenolol (TENORMIN) 50 MG tablet Take 50 mg by mouth daily.        . Calcium Carbonate-Vitamin D (CALTRATE 600+D) 600-400 MG-UNIT per tablet Take 1 tablet by mouth daily.        . captopril (CAPOTEN) 25 MG tablet Take 25 mg by mouth 2 (two) times daily.        . fish oil-omega-3 fatty acids 1000 MG capsule Take 1 capsule by mouth daily.        Marland Kitchen glipiZIDE (GLUCOTROL XL) 5 MG 24 hr tablet Take 5 mg by mouth daily.        . metFORMIN (GLUCOPHAGE-XR) 500 MG 24 hr tablet 4 tablets by mouth daily       . Multiple Vitamins-Minerals (CENTRUM SILVER PO) Take 1 tablet by mouth daily.        . simvastatin (ZOCOR) 40 MG tablet Take 40 mg by mouth daily.           Objective: Blood pressure 128/80, pulse 82, temperature 98.8 F (37.1 C), temperature source Oral, resp. rate 18, height 5' 2"  (1.575 m), weight 236 lb 1.6 oz (107.094 kg). Patient is alert and in no acute distress. Asterixis absent. Conjunctiva is pink. Sclera is nonicteric Oropharyngeal mucosa is normal. No neck masses or thyromegaly noted. Cardiac exam with regular rhythm normal S1 and S2. No murmur  or gallop noted. Lungs are clear to auscultation. Abdomen is obese but soft and nontender; difficult to palpate liver edge. No LE edema or clubbing noted.  Labs/studies Results: LFTs from 11/28/2011. Bilirubin oh 0.9, AP 70, AST 69, ALT 34, total protein 6.9 and albumin 3.6.   Assessment:  Patient has stage IV cirrhosis secondary to non-alcoholic steatohepatitis. She had liver biopsy in may 2012. She has compensated hepatic function. She is at significant risk of decompensation down the road unless she changes her lifestyle and lose some weight. Her AST was 55 in June and now is 25. She needs to be screened for [facility] .   Plan:  Once again patient informed in no uncertain terms as to the underlying diagnosis and potential sequelae such as ascites, hepatic encephalopathy, variceal bleed and HCC. Will check alpha-fetoprotein. Screening hepatic ultrasound. Office visit in 6 months. Will consider EGD following that visit

## 2011-12-12 DIAGNOSIS — R161 Splenomegaly, not elsewhere classified: Secondary | ICD-10-CM | POA: Diagnosis not present

## 2011-12-12 DIAGNOSIS — K7460 Unspecified cirrhosis of liver: Secondary | ICD-10-CM | POA: Diagnosis not present

## 2011-12-21 ENCOUNTER — Telehealth (INDEPENDENT_AMBULATORY_CARE_PROVIDER_SITE_OTHER): Payer: Self-pay | Admitting: *Deleted

## 2011-12-21 DIAGNOSIS — R772 Abnormality of alphafetoprotein: Secondary | ICD-10-CM

## 2011-12-21 DIAGNOSIS — K7460 Unspecified cirrhosis of liver: Secondary | ICD-10-CM

## 2011-12-21 NOTE — Telephone Encounter (Signed)
Per Dr.Rehman after reviewing patient's recent lab results , the patient will need to have repeat in 3 months.  Labs are noted for February 2014.

## 2012-01-03 ENCOUNTER — Encounter (INDEPENDENT_AMBULATORY_CARE_PROVIDER_SITE_OTHER): Payer: Self-pay

## 2012-01-23 ENCOUNTER — Encounter: Payer: Self-pay | Admitting: Endocrinology

## 2012-01-23 ENCOUNTER — Ambulatory Visit (INDEPENDENT_AMBULATORY_CARE_PROVIDER_SITE_OTHER): Payer: Medicare Other | Admitting: Endocrinology

## 2012-01-23 VITALS — BP 134/74 | HR 82 | Temp 98.0°F | Wt 238.0 lb

## 2012-01-23 DIAGNOSIS — E0590 Thyrotoxicosis, unspecified without thyrotoxic crisis or storm: Secondary | ICD-10-CM

## 2012-01-23 NOTE — Progress Notes (Signed)
Subjective:    Patient ID: Cheryl Velasquez, female    DOB: 01-Jun-1940, 71 y.o.   MRN: [id number]  HPI Pt had i-131 rx for hyperthyroidism due to multinodular goiter, in march of 2013.  She presented in 2013 with a thyroid nodule, cold on scan.  bx was benign.  pt states she feels well in general, except for ongoing fatigue.   Past Medical History  Diagnosis Date  . History of breast cancer   . History of chicken pox   . Arthritis   . Diabetes mellitus   . Hyperlipidemia   . Hypertension   . Thyroid disease   . Cirrhosis     Past Surgical History  Procedure Date  . Hernia repair 2004  . Abdominal hysterectomy 1987  . Breast biopsy 2005    History   Social History  . Marital Status: Single    Spouse Name: N/A    Number of Children: 0  . Years of Education: 10   Occupational History  . Textiles    Social History Main Topics  . Smoking status: Never Smoker   . Smokeless tobacco: Never Used  . Alcohol Use: No  . Drug Use: No  . Sexually Active: Not on file   Other Topics Concern  . Not on file   Social History Narrative   Regular Exercise-no    Current Outpatient Prescriptions on File Prior to Visit  Medication Sig Dispense Refill  . ALPRAZolam (XANAX) 0.5 MG tablet Take 0.5 mg by mouth daily as needed.        Marland Kitchen aspirin (BAYER LOW DOSE) 81 MG EC tablet Take 81 mg by mouth daily. Swallow whole.       Marland Kitchen atenolol (TENORMIN) 50 MG tablet Take 50 mg by mouth daily.        . Calcium Carbonate-Vitamin D (CALTRATE 600+D) 600-400 MG-UNIT per tablet Take 1 tablet by mouth daily.        . captopril (CAPOTEN) 25 MG tablet Take 25 mg by mouth 2 (two) times daily.        . fish oil-omega-3 fatty acids 1000 MG capsule Take 1 capsule by mouth daily.        Marland Kitchen glipiZIDE (GLUCOTROL XL) 5 MG 24 hr tablet Take 5 mg by mouth daily.        . metFORMIN (GLUCOPHAGE-XR) 500 MG 24 hr tablet 4 tablets by mouth daily       . Multiple Vitamins-Minerals (CENTRUM SILVER PO) Take 1 tablet by mouth  daily.        . simvastatin (ZOCOR) 40 MG tablet Take 40 mg by mouth daily.          No Known Allergies  Family History  Problem Relation Age of Onset  . Cancer Other     Colon Cancer, Prostate Cancer-Other Blood Relative  . Hyperlipidemia Other     Other Blood Relative  . Stroke Other     Parent, Other Blood Relative  . Diabetes Other     Parent, Other Blood Relative  . Hypertension Other     Parent  . Prostate cancer Maternal Aunt   . Dementia Maternal Aunt   . Dementia Sister   . Dementia Sister   . Alzheimer's disease Sister   . Liver disease Brother    BP 134/74  Pulse 82  Temp 98 F (36.7 C) (Oral)  Wt 238 lb (107.956 kg)  SpO2 97%  Review of Systems Denies weight change    Objective:  Physical Exam VITAL SIGNS:  See vs page GENERAL: no distress NECK: There is no palpable thyroid enlargement.  No thyroid nodule is palpable.  No palpable lymphadenopathy at the anterior neck.     Lab Results  Component Value Date   TSH 1.694 01/23/2012       Assessment & Plan:  Hyperthyroidism, ewsolved with i-131 rx

## 2012-01-23 NOTE — Patient Instructions (Addendum)
blood tests are being requested for you today.  We'll contact you with results. Please come back for a follow-up appointment in 6 months Unless the blood test is overactive, let's recheck the ultrasound. most of the time, a "lumpy thyroid" will eventually become overactive again.  this is usually a slow process, happening over the span of many years.

## 2012-01-24 ENCOUNTER — Telehealth: Payer: Self-pay | Admitting: *Deleted

## 2012-01-24 NOTE — Telephone Encounter (Signed)
PATIENT NOTIFIED OF NORMAL TSH REPORT.

## 2012-01-24 NOTE — Telephone Encounter (Signed)
Message copied by Ellene Route on Tue Jan 24, 2012  8:35 AM ------      Message from: Renato Shin      Created: Tue Jan 24, 2012  7:53 AM       please call patient:      normal

## 2012-02-03 DIAGNOSIS — J019 Acute sinusitis, unspecified: Secondary | ICD-10-CM | POA: Diagnosis not present

## 2012-02-03 DIAGNOSIS — R070 Pain in throat: Secondary | ICD-10-CM | POA: Diagnosis not present

## 2012-02-27 DIAGNOSIS — Z853 Personal history of malignant neoplasm of breast: Secondary | ICD-10-CM | POA: Diagnosis not present

## 2012-02-27 DIAGNOSIS — Z1231 Encounter for screening mammogram for malignant neoplasm of breast: Secondary | ICD-10-CM | POA: Diagnosis not present

## 2012-02-27 DIAGNOSIS — N63 Unspecified lump in unspecified breast: Secondary | ICD-10-CM | POA: Diagnosis not present

## 2012-03-05 DIAGNOSIS — IMO0002 Reserved for concepts with insufficient information to code with codable children: Secondary | ICD-10-CM | POA: Diagnosis not present

## 2012-03-05 DIAGNOSIS — E782 Mixed hyperlipidemia: Secondary | ICD-10-CM | POA: Diagnosis not present

## 2012-03-05 DIAGNOSIS — E119 Type 2 diabetes mellitus without complications: Secondary | ICD-10-CM | POA: Diagnosis not present

## 2012-03-05 DIAGNOSIS — E039 Hypothyroidism, unspecified: Secondary | ICD-10-CM | POA: Diagnosis not present

## 2012-03-05 DIAGNOSIS — I10 Essential (primary) hypertension: Secondary | ICD-10-CM | POA: Diagnosis not present

## 2012-03-05 DIAGNOSIS — E7800 Pure hypercholesterolemia, unspecified: Secondary | ICD-10-CM | POA: Diagnosis not present

## 2012-03-05 DIAGNOSIS — R5383 Other fatigue: Secondary | ICD-10-CM | POA: Diagnosis not present

## 2012-03-05 DIAGNOSIS — Z79899 Other long term (current) drug therapy: Secondary | ICD-10-CM | POA: Diagnosis not present

## 2012-03-05 DIAGNOSIS — F411 Generalized anxiety disorder: Secondary | ICD-10-CM | POA: Diagnosis not present

## 2012-03-13 DIAGNOSIS — N63 Unspecified lump in unspecified breast: Secondary | ICD-10-CM | POA: Diagnosis not present

## 2012-03-13 DIAGNOSIS — Z853 Personal history of malignant neoplasm of breast: Secondary | ICD-10-CM | POA: Diagnosis not present

## 2012-03-13 DIAGNOSIS — Z1231 Encounter for screening mammogram for malignant neoplasm of breast: Secondary | ICD-10-CM | POA: Diagnosis not present

## 2012-03-13 DIAGNOSIS — R928 Other abnormal and inconclusive findings on diagnostic imaging of breast: Secondary | ICD-10-CM | POA: Diagnosis not present

## 2012-03-15 ENCOUNTER — Telehealth (INDEPENDENT_AMBULATORY_CARE_PROVIDER_SITE_OTHER): Payer: Self-pay | Admitting: *Deleted

## 2012-03-15 ENCOUNTER — Encounter (INDEPENDENT_AMBULATORY_CARE_PROVIDER_SITE_OTHER): Payer: Self-pay | Admitting: *Deleted

## 2012-03-15 DIAGNOSIS — R772 Abnormality of alphafetoprotein: Secondary | ICD-10-CM

## 2012-03-15 DIAGNOSIS — K7460 Unspecified cirrhosis of liver: Secondary | ICD-10-CM

## 2012-03-15 NOTE — Telephone Encounter (Signed)
Lab order printed

## 2012-03-20 ENCOUNTER — Encounter (INDEPENDENT_AMBULATORY_CARE_PROVIDER_SITE_OTHER): Payer: Self-pay

## 2012-03-21 ENCOUNTER — Telehealth (INDEPENDENT_AMBULATORY_CARE_PROVIDER_SITE_OTHER): Payer: Self-pay | Admitting: *Deleted

## 2012-03-21 NOTE — Telephone Encounter (Signed)
Noted. I will make Dr.Rehman aware and if any more is needed I will let the patient know.

## 2012-03-21 NOTE — Telephone Encounter (Signed)
Cheryl Velasquez would like for [name] to return her call at [phone]. She has received a letter to have blood work and just had it done on 03/05/12 by her PCP. Dr. Laural Golden should have received a copy and would like to know why she needed more?

## 2012-05-31 ENCOUNTER — Encounter (INDEPENDENT_AMBULATORY_CARE_PROVIDER_SITE_OTHER): Payer: Self-pay | Admitting: *Deleted

## 2012-06-11 ENCOUNTER — Ambulatory Visit (INDEPENDENT_AMBULATORY_CARE_PROVIDER_SITE_OTHER): Payer: Medicare Other | Admitting: Internal Medicine

## 2012-06-11 ENCOUNTER — Encounter (INDEPENDENT_AMBULATORY_CARE_PROVIDER_SITE_OTHER): Payer: Self-pay | Admitting: Internal Medicine

## 2012-06-11 VITALS — BP 132/60 | HR 64 | Ht 62.0 in | Wt 238.0 lb

## 2012-06-11 DIAGNOSIS — K7460 Unspecified cirrhosis of liver: Secondary | ICD-10-CM

## 2012-06-11 NOTE — Patient Instructions (Addendum)
AFP. US abdomen.  OV in 6 months

## 2012-06-11 NOTE — Progress Notes (Addendum)
Subjective:     Patient ID: Cheryl Velasquez, female   DOB: 1940/12/27, 72 y.o.   MRN: [id number]  HPI Cheryl Velasquez is a 72 yr old female here today for f/u of her biopsy-proven stage 4 cirrhosis secondary to NAFLD. Marland Kitchen  She was last seen in October of 2013.  Sheis doing good. She is working part time as a guard. She is looking after her sister.  She has gained 2 pounds since her last visit.  She is not exercising.  No abdominal pain. No melena or bright red rectal bleeding.  No Lower leg edema.  Appetite is good.   Hx of Hep C. Quaint neg. 12/12/2011 Abdominal US: Hepatic cirrhosis. No hepatic masses visualized. Consider MRI for hepatoma surveillance if clinically warranted given its increased sensitivity and specificity. Mld splenomegaly consistent with portal venous hypertension. No evidence of ascites.  03/06/2012 Albumin 3.6, Total bili 0.6, ALP 70, AST 48, ALT 31, H and H 11. 9 and 31.8, Platelet ct 80.    Review of Systems see hpi Current Outpatient Prescriptions  Medication Sig Dispense Refill  . ALPRAZolam (XANAX) 0.5 MG tablet Take 0.5 mg by mouth daily as needed.        Marland Kitchen aspirin (BAYER LOW DOSE) 81 MG EC tablet Take 81 mg by mouth daily. Swallow whole.       Marland Kitchen atenolol (TENORMIN) 50 MG tablet Take 50 mg by mouth daily.        . Calcium Carbonate-Vitamin D (CALTRATE 600+D) 600-400 MG-UNIT per tablet Take 1 tablet by mouth daily.        . captopril (CAPOTEN) 25 MG tablet Take 25 mg by mouth 2 (two) times daily.        Marland Kitchen glipiZIDE (GLUCOTROL XL) 5 MG 24 hr tablet Take 5 mg by mouth daily.        . metFORMIN (GLUCOPHAGE-XR) 500 MG 24 hr tablet 4 tablets by mouth daily       . Multiple Vitamins-Minerals (CENTRUM SILVER PO) Take 1 tablet by mouth daily.        . simvastatin (ZOCOR) 40 MG tablet Take 40 mg by mouth daily.         No current facility-administered medications for this visit.   Past Medical History  Diagnosis Date  . History of breast cancer   . History of chicken pox   .  Arthritis   . Diabetes mellitus   . Hyperlipidemia   . Hypertension   . Thyroid disease   . Cirrhosis    Past Surgical History  Procedure Laterality Date  . Hernia repair  2004  . Abdominal hysterectomy  1987  . Breast biopsy  2005   No Known Allergies      Objective:   Physical Exam  Filed Vitals:   06/11/12 1040  BP: 132/60  Pulse: 64  Height: 5' 2"  (1.575 m)  Weight: 238 lb 0.3 oz (107.965 kg)   Alert and oriented. Skin warm and dry. Oral mucosa is moist.   . Sclera anicteric, conjunctivae is pink. Thyroid not enlarged. No cervical lymphadenopathy. Lungs clear. Heart regular rate and rhythm.  Abdomen is soft, obese. . Bowel sounds are positive. Unable to palpate liver.. No abdominal masses felt. No tenderness.  No edema to lower extremities.      Assessment:   Stage 4 biopsy proven cirrhosis secondary to NAFLD.  I discussed need to EGD to check for varices/possible banding. She would like to discuss EGD with a family member.  Plan:    US abdomen, AFP OV in 6 months.

## 2012-06-25 DIAGNOSIS — K8020 Calculus of gallbladder without cholecystitis without obstruction: Secondary | ICD-10-CM | POA: Diagnosis not present

## 2012-06-25 DIAGNOSIS — K7460 Unspecified cirrhosis of liver: Secondary | ICD-10-CM | POA: Diagnosis not present

## 2012-06-28 ENCOUNTER — Telehealth (INDEPENDENT_AMBULATORY_CARE_PROVIDER_SITE_OTHER): Payer: Self-pay | Admitting: *Deleted

## 2012-06-28 NOTE — Telephone Encounter (Signed)
LM - would like to get the results of her [place]. The return phone number is [phone].

## 2012-06-28 NOTE — Telephone Encounter (Signed)
Results given to patient. She still has not decided whether she is going to have an EGD looking for varices yet.

## 2012-07-04 ENCOUNTER — Encounter (INDEPENDENT_AMBULATORY_CARE_PROVIDER_SITE_OTHER): Payer: Self-pay

## 2012-07-24 DIAGNOSIS — E039 Hypothyroidism, unspecified: Secondary | ICD-10-CM | POA: Diagnosis not present

## 2012-07-24 DIAGNOSIS — I10 Essential (primary) hypertension: Secondary | ICD-10-CM | POA: Diagnosis not present

## 2012-07-24 DIAGNOSIS — IMO0002 Reserved for concepts with insufficient information to code with codable children: Secondary | ICD-10-CM | POA: Diagnosis not present

## 2012-07-24 DIAGNOSIS — E782 Mixed hyperlipidemia: Secondary | ICD-10-CM | POA: Diagnosis not present

## 2012-07-24 DIAGNOSIS — F411 Generalized anxiety disorder: Secondary | ICD-10-CM | POA: Diagnosis not present

## 2012-07-30 DIAGNOSIS — I10 Essential (primary) hypertension: Secondary | ICD-10-CM | POA: Diagnosis not present

## 2012-07-30 DIAGNOSIS — J019 Acute sinusitis, unspecified: Secondary | ICD-10-CM | POA: Diagnosis not present

## 2012-07-30 DIAGNOSIS — F411 Generalized anxiety disorder: Secondary | ICD-10-CM | POA: Diagnosis not present

## 2012-07-30 DIAGNOSIS — E039 Hypothyroidism, unspecified: Secondary | ICD-10-CM | POA: Diagnosis not present

## 2012-07-30 DIAGNOSIS — IMO0002 Reserved for concepts with insufficient information to code with codable children: Secondary | ICD-10-CM | POA: Diagnosis not present

## 2012-07-30 DIAGNOSIS — D696 Thrombocytopenia, unspecified: Secondary | ICD-10-CM | POA: Diagnosis not present

## 2012-07-30 DIAGNOSIS — E782 Mixed hyperlipidemia: Secondary | ICD-10-CM | POA: Diagnosis not present

## 2012-08-15 ENCOUNTER — Encounter: Payer: Medicare Other | Admitting: Internal Medicine

## 2012-08-22 DIAGNOSIS — Z853 Personal history of malignant neoplasm of breast: Secondary | ICD-10-CM

## 2012-08-22 DIAGNOSIS — D649 Anemia, unspecified: Secondary | ICD-10-CM | POA: Diagnosis not present

## 2012-08-22 DIAGNOSIS — C50919 Malignant neoplasm of unspecified site of unspecified female breast: Secondary | ICD-10-CM | POA: Diagnosis not present

## 2012-08-22 DIAGNOSIS — D72819 Decreased white blood cell count, unspecified: Secondary | ICD-10-CM

## 2012-08-22 DIAGNOSIS — D696 Thrombocytopenia, unspecified: Secondary | ICD-10-CM

## 2012-08-22 DIAGNOSIS — E538 Deficiency of other specified B group vitamins: Secondary | ICD-10-CM | POA: Diagnosis not present

## 2012-08-23 ENCOUNTER — Telehealth (INDEPENDENT_AMBULATORY_CARE_PROVIDER_SITE_OTHER): Payer: Self-pay | Admitting: *Deleted

## 2012-08-23 NOTE — Telephone Encounter (Signed)
Daughter called and wants to go ahead and get her EGD scheduled.  Please call her [phone]

## 2012-08-27 DIAGNOSIS — Z85828 Personal history of other malignant neoplasm of skin: Secondary | ICD-10-CM | POA: Diagnosis not present

## 2012-08-27 DIAGNOSIS — L820 Inflamed seborrheic keratosis: Secondary | ICD-10-CM | POA: Diagnosis not present

## 2012-08-27 DIAGNOSIS — D0410 Carcinoma in situ of skin of unspecified eyelid, including canthus: Secondary | ICD-10-CM | POA: Diagnosis not present

## 2012-08-27 DIAGNOSIS — L570 Actinic keratosis: Secondary | ICD-10-CM | POA: Diagnosis not present

## 2012-08-27 DIAGNOSIS — C44320 Squamous cell carcinoma of skin of unspecified parts of face: Secondary | ICD-10-CM | POA: Diagnosis not present

## 2012-08-27 DIAGNOSIS — D0470 Carcinoma in situ of skin of unspecified lower limb, including hip: Secondary | ICD-10-CM | POA: Diagnosis not present

## 2012-08-27 DIAGNOSIS — D485 Neoplasm of uncertain behavior of skin: Secondary | ICD-10-CM | POA: Diagnosis not present

## 2012-08-29 ENCOUNTER — Telehealth (INDEPENDENT_AMBULATORY_CARE_PROVIDER_SITE_OTHER): Payer: Self-pay | Admitting: *Deleted

## 2012-08-29 ENCOUNTER — Other Ambulatory Visit (INDEPENDENT_AMBULATORY_CARE_PROVIDER_SITE_OTHER): Payer: Self-pay | Admitting: *Deleted

## 2012-08-29 DIAGNOSIS — K7460 Unspecified cirrhosis of liver: Secondary | ICD-10-CM

## 2012-08-29 NOTE — Telephone Encounter (Signed)
agree

## 2012-08-29 NOTE — Telephone Encounter (Signed)
EGD sch'd 09/20/12, patient's niece Vaughan Basta is aware

## 2012-08-29 NOTE — Telephone Encounter (Signed)
  Procedure: egd  Reason/Indication: cirrhosis  Has patient had this procedure before?  no  If so, when, by whom and where?    Is there a family history of colon cancer?  no  Who?  What age when diagnosed?    Is patient diabetic?   yes      Does patient have prosthetic heart valve?  no  Do you have a pacemaker?  no  Has patient ever had endocarditis? no  Has patient had joint replacement within last 12 months?  no  Is patient on Coumadin, Plavix and/or Aspirin? yes  Medications: see EPIC  Allergies: see EPIC  Medication Adjustment: asa 2 days  Procedure date & time: 09/20/12 at 125

## 2012-08-30 ENCOUNTER — Encounter: Payer: Self-pay | Admitting: Endocrinology

## 2012-08-30 ENCOUNTER — Ambulatory Visit (INDEPENDENT_AMBULATORY_CARE_PROVIDER_SITE_OTHER): Payer: Medicare Other | Admitting: Endocrinology

## 2012-08-30 VITALS — BP 124/80 | HR 80 | Ht 62.0 in | Wt 239.0 lb

## 2012-08-30 DIAGNOSIS — E042 Nontoxic multinodular goiter: Secondary | ICD-10-CM

## 2012-08-30 NOTE — Patient Instructions (Addendum)
blood tests are being requested for you today.  We'll contact you with results. Please come back for a follow-up appointment in 6 months let's recheck the ultrasound.  you will receive a phone call, about a day and time for an appointment most of the time, a "lumpy thyroid" will eventually become overactive again.  this is usually a slow process, happening over the span of many years.

## 2012-08-30 NOTE — Progress Notes (Signed)
Subjective:    Patient ID: Cheryl Velasquez, female    DOB: Nov 10, 1940, 72 y.o.   MRN: [id number]  HPI Pt had i-131 rx for hyperthyroidism due to multinodular goiter, in march of 2013.  She presented in 2013 with a thyroid nodule, cold on scan.  bx was benign, prior to the i-131 rx.  pt states she feels well in general, except for ongoing fatigue.  Past Medical History  Diagnosis Date  . History of breast cancer   . History of chicken pox   . Arthritis   . Diabetes mellitus   . Hyperlipidemia   . Hypertension   . Thyroid disease   . Cirrhosis     Past Surgical History  Procedure Laterality Date  . Hernia repair  2004  . Abdominal hysterectomy  1987  . Breast biopsy  2005    History   Social History  . Marital Status: Single    Spouse Name: N/A    Number of Children: 0  . Years of Education: 10   Occupational History  . Textiles    Social History Main Topics  . Smoking status: Never Smoker   . Smokeless tobacco: Never Used  . Alcohol Use: No  . Drug Use: No  . Sexually Active: Not on file   Other Topics Concern  . Not on file   Social History Narrative   Regular Exercise-no    Current Outpatient Prescriptions on File Prior to Visit  Medication Sig Dispense Refill  . ALPRAZolam (XANAX) 0.5 MG tablet Take 0.5 mg by mouth daily as needed.        Marland Kitchen aspirin (BAYER LOW DOSE) 81 MG EC tablet Take 81 mg by mouth daily. Swallow whole.       Marland Kitchen atenolol (TENORMIN) 50 MG tablet Take 50 mg by mouth daily.        . Calcium Carbonate-Vitamin D (CALTRATE 600+D) 600-400 MG-UNIT per tablet Take 1 tablet by mouth daily.        . captopril (CAPOTEN) 25 MG tablet Take 25 mg by mouth 2 (two) times daily.        Marland Kitchen glipiZIDE (GLUCOTROL XL) 5 MG 24 hr tablet Take 5 mg by mouth daily.        . metFORMIN (GLUCOPHAGE-XR) 500 MG 24 hr tablet 4 tablets by mouth daily       . Multiple Vitamins-Minerals (CENTRUM SILVER PO) Take 1 tablet by mouth daily.        . simvastatin (ZOCOR) 40 MG tablet  Take 40 mg by mouth daily.         No current facility-administered medications on file prior to visit.    No Known Allergies  Family History  Problem Relation Age of Onset  . Cancer Other     Colon Cancer, Prostate Cancer-Other Blood Relative  . Hyperlipidemia Other     Other Blood Relative  . Stroke Other     Parent, Other Blood Relative  . Diabetes Other     Parent, Other Blood Relative  . Hypertension Other     Parent  . Prostate cancer Maternal Aunt   . Dementia Maternal Aunt   . Dementia Sister   . Dementia Sister   . Alzheimer's disease Sister   . Liver disease Brother     BP 124/80  Pulse 80  Ht 5' 2"  (1.575 m)  Wt 239 lb (108.41 kg)  BMI 43.7 kg/m2  SpO2 96%    Review of Systems Denies weight change  Objective:   Physical Exam VITAL SIGNS:  See vs page GENERAL: no distress NECK: There is no palpable thyroid enlargement.  No thyroid nodule is palpable.  No palpable lymphadenopathy at the anterior neck.   Lab Results  Component Value Date   TSH 1.79 08/30/2012      Assessment & Plan:  Hyperthyroidism, resolved with i-131 Multinodular goiter.  She is due for f/u US

## 2012-08-31 ENCOUNTER — Telehealth: Payer: Self-pay | Admitting: *Deleted

## 2012-08-31 NOTE — Telephone Encounter (Signed)
Called pt and told her that her TSH was normal and that is good. Pt understood.

## 2012-09-03 ENCOUNTER — Telehealth: Payer: Self-pay

## 2012-09-03 NOTE — Telephone Encounter (Signed)
It was done last thurs

## 2012-09-03 NOTE — Telephone Encounter (Signed)
Pt left voicemail stating she needed a referral to get an ultrasound done in [place] pleae advise [phone]

## 2012-09-03 NOTE — Telephone Encounter (Signed)
Pt advised.

## 2012-09-11 ENCOUNTER — Encounter (HOSPITAL_COMMUNITY): Payer: Self-pay | Admitting: Pharmacy Technician

## 2012-09-14 DIAGNOSIS — E538 Deficiency of other specified B group vitamins: Secondary | ICD-10-CM | POA: Insufficient documentation

## 2012-09-14 DIAGNOSIS — D72819 Decreased white blood cell count, unspecified: Secondary | ICD-10-CM | POA: Insufficient documentation

## 2012-09-14 DIAGNOSIS — K7460 Unspecified cirrhosis of liver: Secondary | ICD-10-CM | POA: Diagnosis not present

## 2012-09-14 DIAGNOSIS — Z853 Personal history of malignant neoplasm of breast: Secondary | ICD-10-CM

## 2012-09-14 DIAGNOSIS — D649 Anemia, unspecified: Secondary | ICD-10-CM | POA: Diagnosis not present

## 2012-09-14 DIAGNOSIS — D6959 Other secondary thrombocytopenia: Secondary | ICD-10-CM | POA: Diagnosis not present

## 2012-09-14 DIAGNOSIS — D696 Thrombocytopenia, unspecified: Secondary | ICD-10-CM | POA: Insufficient documentation

## 2012-09-17 DIAGNOSIS — E538 Deficiency of other specified B group vitamins: Secondary | ICD-10-CM

## 2012-09-18 DIAGNOSIS — E538 Deficiency of other specified B group vitamins: Secondary | ICD-10-CM

## 2012-09-19 DIAGNOSIS — E538 Deficiency of other specified B group vitamins: Secondary | ICD-10-CM

## 2012-09-20 ENCOUNTER — Ambulatory Visit (HOSPITAL_COMMUNITY)
Admission: RE | Admit: 2012-09-20 | Discharge: 2012-09-20 | Disposition: A | Discharge status: Home Health | Payer: Medicare Other | Source: Ambulatory Visit | Attending: Internal Medicine | Admitting: Internal Medicine

## 2012-09-20 ENCOUNTER — Encounter (HOSPITAL_COMMUNITY): Payer: Self-pay | Admitting: *Deleted

## 2012-09-20 ENCOUNTER — Encounter (HOSPITAL_COMMUNITY)
Admission: RE | Disposition: A | Discharge status: Home Health | Payer: Self-pay | Source: Ambulatory Visit | Attending: Internal Medicine

## 2012-09-20 DIAGNOSIS — I8510 Secondary esophageal varices without bleeding: Secondary | ICD-10-CM | POA: Diagnosis not present

## 2012-09-20 DIAGNOSIS — Z01812 Encounter for preprocedural laboratory examination: Secondary | ICD-10-CM | POA: Insufficient documentation

## 2012-09-20 DIAGNOSIS — I8500 Esophageal varices without bleeding: Secondary | ICD-10-CM

## 2012-09-20 DIAGNOSIS — I10 Essential (primary) hypertension: Secondary | ICD-10-CM | POA: Diagnosis not present

## 2012-09-20 DIAGNOSIS — R161 Splenomegaly, not elsewhere classified: Secondary | ICD-10-CM | POA: Insufficient documentation

## 2012-09-20 DIAGNOSIS — K2960 Other gastritis without bleeding: Secondary | ICD-10-CM

## 2012-09-20 DIAGNOSIS — D696 Thrombocytopenia, unspecified: Secondary | ICD-10-CM | POA: Diagnosis not present

## 2012-09-20 DIAGNOSIS — K319 Disease of stomach and duodenum, unspecified: Secondary | ICD-10-CM | POA: Diagnosis not present

## 2012-09-20 DIAGNOSIS — K208 Other esophagitis: Secondary | ICD-10-CM

## 2012-09-20 DIAGNOSIS — D6949 Other primary thrombocytopenia: Secondary | ICD-10-CM

## 2012-09-20 DIAGNOSIS — E119 Type 2 diabetes mellitus without complications: Secondary | ICD-10-CM | POA: Diagnosis not present

## 2012-09-20 DIAGNOSIS — K7689 Other specified diseases of liver: Secondary | ICD-10-CM

## 2012-09-20 DIAGNOSIS — K7460 Unspecified cirrhosis of liver: Secondary | ICD-10-CM | POA: Insufficient documentation

## 2012-09-20 HISTORY — PX: ESOPHAGOGASTRODUODENOSCOPY: SHX5428

## 2012-09-20 SURGERY — EGD (ESOPHAGOGASTRODUODENOSCOPY)
Anesthesia: Moderate Sedation

## 2012-09-20 MED ORDER — SUCRALFATE 1 GM/10ML PO SUSP: 1.0000 g | Freq: Four times a day (QID) | ORAL | Status: DC

## 2012-09-20 MED ORDER — PANTOPRAZOLE SODIUM 40 MG PO TBEC: 40.0000 mg | DELAYED_RELEASE_TABLET | Freq: Every day | ORAL | Status: DC

## 2012-09-20 MED ORDER — MEPERIDINE HCL 50 MG/ML IJ SOLN
INTRAMUSCULAR | Status: AC
  Filled 2012-09-20: qty 1

## 2012-09-20 MED ORDER — MIDAZOLAM HCL 5 MG/5ML IJ SOLN
INTRAMUSCULAR | Status: DC | PRN
  Administered 2012-09-20 (×5): 2 mg via INTRAVENOUS

## 2012-09-20 MED ORDER — MIDAZOLAM HCL 5 MG/5ML IJ SOLN
INTRAMUSCULAR | Status: AC
  Filled 2012-09-20: qty 10

## 2012-09-20 MED ORDER — MEPERIDINE HCL 25 MG/ML IJ SOLN
INTRAMUSCULAR | Status: DC | PRN
  Administered 2012-09-20 (×2): 25 mg via INTRAVENOUS

## 2012-09-20 MED ORDER — BUTAMBEN-TETRACAINE-BENZOCAINE 2-2-14 % EX AERO
INHALATION_SPRAY | CUTANEOUS | Status: DC | PRN
  Administered 2012-09-20: 2 via TOPICAL

## 2012-09-20 MED ORDER — NADOLOL 40 MG PO TABS: 40.0000 mg | ORAL_TABLET | Freq: Every day | ORAL | Status: DC

## 2012-09-20 MED ORDER — STERILE WATER FOR IRRIGATION IR SOLN
Status: DC | PRN
  Administered 2012-09-20: 12:00:00

## 2012-09-20 MED ORDER — SODIUM CHLORIDE 0.9 % IV SOLN
INTRAVENOUS | Status: DC
  Administered 2012-09-20: 11:00:00 via INTRAVENOUS

## 2012-09-20 NOTE — H&P (Signed)
Cheryl Velasquez is an 72 y.o. female.   Chief Complaint: Patient is here for EGD and possible esophageal variceal banding. HPI: Patient is 72 year old Caucasian female who was stage IV cirrhosis secondary to NAFLD. She is undergoing EGD looking for varices. She has large esophageal varices he would be banded. Patient denies dysphagia,, pain or melena.  Past Medical History  Diagnosis Date  . History of breast cancer   . History of chicken pox   . Arthritis   . Diabetes mellitus   . Hyperlipidemia   . Hypertension   . Thyroid disease   . Cirrhosis     Past Surgical History  Procedure Laterality Date  . Hernia repair  2004  . Abdominal hysterectomy  1987  . Breast biopsy  2005    Family History  Problem Relation Age of Onset  . Cancer Other     Colon Cancer, Prostate Cancer-Other Blood Relative  . Hyperlipidemia Other     Other Blood Relative  . Stroke Other     Parent, Other Blood Relative  . Diabetes Other     Parent, Other Blood Relative  . Hypertension Other     Parent  . Prostate cancer Maternal Aunt   . Dementia Maternal Aunt   . Dementia Sister   . Dementia Sister   . Alzheimer's disease Sister   . Liver disease Brother    Social History:  reports that she has never smoked. She has never used smokeless tobacco. She reports that she does not drink alcohol or use illicit drugs.  Allergies: No Known Allergies  Medications Prior to Admission  Medication Sig Dispense Refill  . ALPRAZolam (XANAX) 0.5 MG tablet Take 0.5 mg by mouth daily as needed for anxiety.       Marland Kitchen aspirin (BAYER LOW DOSE) 81 MG EC tablet Take 81 mg by mouth daily. Swallow whole.       Marland Kitchen atenolol (TENORMIN) 50 MG tablet Take 50 mg by mouth daily.        . Calcium Carbonate-Vitamin D (CALTRATE 600+D) 600-400 MG-UNIT per tablet Take 1 tablet by mouth daily.        . captopril (CAPOTEN) 25 MG tablet Take 25 mg by mouth 2 (two) times daily.        Marland Kitchen glipiZIDE (GLUCOTROL XL) 5 MG 24 hr tablet Take 5  mg by mouth daily.        . metFORMIN (GLUCOPHAGE-XR) 500 MG 24 hr tablet 4 tablets by mouth daily       . Multiple Vitamins-Minerals (CENTRUM SILVER PO) Take 1 tablet by mouth daily.        . simvastatin (ZOCOR) 40 MG tablet Take 40 mg by mouth daily.        . vitamin B-12 (CYANOCOBALAMIN) 1000 MCG tablet Take 1,000 mcg by mouth daily.        Results for orders placed during the hospital encounter of 09/20/12 (from the past 48 hour(s))  GLUCOSE, CAPILLARY     Status: Abnormal   Collection Time    09/20/12 11:16 AM      Result Value Range   Glucose-Capillary 118 (*) 70 - 99 mg/dL   No results found.  ROS  Blood pressure 155/75, pulse 79, temperature 98.8 F (37.1 C), temperature source Oral, resp. rate 18, SpO2 94.00%. Physical Exam  Constitutional:  Pleasant well-developed obese Caucasian female in NAD  HENT:  Mouth/Throat: Oropharynx is clear and moist.  Eyes: Conjunctivae are normal. No scleral icterus.  Neck: No  thyromegaly present.  Cardiovascular: Normal rate, regular rhythm and normal heart sounds.   No murmur heard. Respiratory: Effort normal and breath sounds normal.  GI: Soft. She exhibits no distension and no mass. There is no guarding.  Liver 5-6 cm below RCM  Musculoskeletal: She exhibits no edema.  Lymphadenopathy:    She has no cervical adenopathy.  Neurological: She is alert.  Skin: Skin is warm and dry.     Assessment/Plan Stage IV cirrhosis secondary to NAFLD. EGD, possible esophageal variceal banding.  Kishaun Erekson U 09/20/2012, 11:44 AM

## 2012-09-20 NOTE — Op Note (Signed)
EGD PROCEDURE REPORT  PATIENT:  Cheryl Velasquez  MR#:  [id number] Birthdate:  May 04, 1940, 72 y.o., female Endoscopist:  Dr. Rogene Houston, MD Referred By:  Dr. Gar Ponto, MD Procedure Date: 09/20/2012  Procedure:   EGD with esophageal variceal banding.  Indications:  Patient is 72 year old Caucasian female with stage IV cirrhosis secondary to NAFLD who is undergoing EGD looking for varices. If she has large varices these will be banded for primary prophylaxis. Her likelihood of varices is high given that she has thrombocytopenia and splenomegaly.            Informed Consent:  The risks, benefits, alternatives & imponderables which include, but are not limited to, bleeding, infection, perforation, drug reaction and potential missed lesion have been reviewed.  The potential for biopsy, lesion removal, esophageal dilation, etc. have also been discussed.  Questions have been answered.  All parties agreeable.  Please see history & physical in medical record for more information.  Medications:  Demerol 50 mg IV Versed 10 mg IV Cetacaine spray topically for oropharyngeal anesthesia  Description of procedure:  The endoscope was introduced through the mouth and advanced to the second portion of the duodenum without difficulty or limitations. The mucosal surfaces were surveyed very carefully during advancement of the scope and upon withdrawal.  Findings:  Esophagus:  Mucosa of the proximal and middle segment was normal. Three columns of esophageal varices were noted. These appear to be grade III although one was larger than the other two.  GEJ:  37 cm Stomach:  Stomach was empty and distended very well with insufflation. Folds in the proximal stomach was normal. Examination of  mucosa at the gastric body revealed mosaic pattern or snake skinning. Two erosions noted in prepyloric region. Angularis fundus and cardia were examined by retroflex in the scope and were unremarkable. There were no fundal  varices. Duodenum:  Normal bulbar and post bulbar mucosa.  Therapeutic/Diagnostic Maneuvers Performed:   Endoscope was withdrawn and multiple banding device was loaded onto the scope which was reintroduced into the esophagus. Large are of the 3 varices were banded at two levels and the other columns were banded once. Four bands were used altogether.  Complications:  None  Impression: Three columns of grade III esophageal varices. These were banded as above. No evidence of fundal varices Portal gastropathy. Erosive antral gastritis.  Recommendations:  Discontinue aspirin and atenolol. Nadolol 40 mg by mouth daily. Sucralfate suspension 1 g by mouth 4 times a day for 2 weeks. Pantoprazole 40 mg by mouth q. A.m. H. pylori serology. Office visit in 8  weeks  REHMAN,NAJEEB U  09/20/2012  12:17 PM  CC: Dr. Gar Ponto, MD & Dr. Rayne Du ref. provider found

## 2012-09-21 LAB — H. PYLORI ANTIBODY, IGG: H Pylori IgG: >8 {ISR} — ABNORMAL HIGH

## 2012-09-24 ENCOUNTER — Telehealth (INDEPENDENT_AMBULATORY_CARE_PROVIDER_SITE_OTHER): Payer: Self-pay | Admitting: *Deleted

## 2012-09-24 ENCOUNTER — Encounter (HOSPITAL_COMMUNITY): Payer: Self-pay | Admitting: Internal Medicine

## 2012-09-24 DIAGNOSIS — E538 Deficiency of other specified B group vitamins: Secondary | ICD-10-CM | POA: Diagnosis not present

## 2012-09-24 NOTE — Progress Notes (Signed)
Spoke to [place] at Dr. Olevia Perches office about setting [name] up with an 8 week appointment as Dr. Laural Golden requested. Butch Penny stated that she would call the patient with an appointment,

## 2012-09-24 NOTE — Telephone Encounter (Signed)
You asked patient to discontinue ASA and Atenolol after her procedure last week and she would like to know how long she needs to stop these, please advise and I will call her, thanks

## 2012-09-25 DIAGNOSIS — E538 Deficiency of other specified B group vitamins: Secondary | ICD-10-CM | POA: Diagnosis not present

## 2012-09-26 ENCOUNTER — Other Ambulatory Visit (INDEPENDENT_AMBULATORY_CARE_PROVIDER_SITE_OTHER): Payer: Self-pay | Admitting: Internal Medicine

## 2012-09-26 MED ORDER — BIS SUBCIT-METRONID-TETRACYC 140-125-125 MG PO CAPS: 3.0000 | ORAL_CAPSULE | Freq: Three times a day (TID) | ORAL | Status: DC

## 2012-09-27 ENCOUNTER — Telehealth (INDEPENDENT_AMBULATORY_CARE_PROVIDER_SITE_OTHER): Payer: Self-pay | Admitting: *Deleted

## 2012-09-27 DIAGNOSIS — C44320 Squamous cell carcinoma of skin of unspecified parts of face: Secondary | ICD-10-CM | POA: Diagnosis not present

## 2012-09-27 DIAGNOSIS — L570 Actinic keratosis: Secondary | ICD-10-CM | POA: Diagnosis not present

## 2012-09-27 NOTE — Telephone Encounter (Signed)
Patient was called yesterday and twice to resume aspirin after 2 weeks. Will not need atenolol since she is on Nadolol.

## 2012-09-27 NOTE — Telephone Encounter (Signed)
Patient had called office on 09/26/12 to make Dr.Rehman aware that the Pylera was $600. She ask if there was something else cheaper. Per Dr.Rehman may call in the following : Prilosec 20 mg by mouth twice a day for 14 days , Amoxicillin 1 Gram by mouth twice daily for 14 days , Metronidazole 500 mg by take by mouth twice a day for 14 days.  This was called to the [facility] [address]. Patient called and a message was left on her voice mail with the above information.

## 2012-10-02 DIAGNOSIS — E538 Deficiency of other specified B group vitamins: Secondary | ICD-10-CM

## 2012-10-04 ENCOUNTER — Telehealth (INDEPENDENT_AMBULATORY_CARE_PROVIDER_SITE_OTHER): Payer: Self-pay | Admitting: *Deleted

## 2012-10-04 ENCOUNTER — Encounter (INDEPENDENT_AMBULATORY_CARE_PROVIDER_SITE_OTHER): Payer: Self-pay

## 2012-10-04 NOTE — Telephone Encounter (Signed)
Cheryl Velasquez has a cough with a tickle in her throat and would like to know how long this will last after her procedure. Cheryl Velasquez she will need to see her PCP because her symptoms are allergy, cold, etc related. Cheryl Velasquez I would send [name]/Dr. Laural Golden a message to make sure. The return phone number is [phone].

## 2012-10-05 NOTE — Telephone Encounter (Signed)
Per Dr.Rehman, this may be reflux. Patient should take her Protonix as directed. If in 2 weeks she is having this symptom she is to call and let our office know. Patient was called and made aware. Forwarded to [employer] as Juluis Rainier

## 2012-10-09 DIAGNOSIS — E538 Deficiency of other specified B group vitamins: Secondary | ICD-10-CM

## 2012-10-11 DIAGNOSIS — L570 Actinic keratosis: Secondary | ICD-10-CM | POA: Diagnosis not present

## 2012-10-11 DIAGNOSIS — C44721 Squamous cell carcinoma of skin of unspecified lower limb, including hip: Secondary | ICD-10-CM | POA: Diagnosis not present

## 2012-10-16 DIAGNOSIS — C50919 Malignant neoplasm of unspecified site of unspecified female breast: Secondary | ICD-10-CM

## 2012-10-16 DIAGNOSIS — E538 Deficiency of other specified B group vitamins: Secondary | ICD-10-CM | POA: Diagnosis not present

## 2012-10-23 DIAGNOSIS — E538 Deficiency of other specified B group vitamins: Secondary | ICD-10-CM | POA: Diagnosis not present

## 2012-11-19 DIAGNOSIS — E039 Hypothyroidism, unspecified: Secondary | ICD-10-CM | POA: Diagnosis not present

## 2012-11-19 DIAGNOSIS — E782 Mixed hyperlipidemia: Secondary | ICD-10-CM | POA: Diagnosis not present

## 2012-11-19 DIAGNOSIS — IMO0002 Reserved for concepts with insufficient information to code with codable children: Secondary | ICD-10-CM | POA: Diagnosis not present

## 2012-11-19 DIAGNOSIS — IMO0001 Reserved for inherently not codable concepts without codable children: Secondary | ICD-10-CM | POA: Diagnosis not present

## 2012-11-19 DIAGNOSIS — E7800 Pure hypercholesterolemia, unspecified: Secondary | ICD-10-CM | POA: Diagnosis not present

## 2012-11-19 DIAGNOSIS — I10 Essential (primary) hypertension: Secondary | ICD-10-CM | POA: Diagnosis not present

## 2012-11-20 ENCOUNTER — Ambulatory Visit (INDEPENDENT_AMBULATORY_CARE_PROVIDER_SITE_OTHER): Payer: Medicare Other | Admitting: Internal Medicine

## 2012-11-20 ENCOUNTER — Encounter (INDEPENDENT_AMBULATORY_CARE_PROVIDER_SITE_OTHER): Payer: Self-pay | Admitting: *Deleted

## 2012-11-20 ENCOUNTER — Encounter (INDEPENDENT_AMBULATORY_CARE_PROVIDER_SITE_OTHER): Payer: Self-pay | Admitting: Internal Medicine

## 2012-11-20 ENCOUNTER — Other Ambulatory Visit (INDEPENDENT_AMBULATORY_CARE_PROVIDER_SITE_OTHER): Payer: Self-pay | Admitting: *Deleted

## 2012-11-20 VITALS — BP 128/66 | HR 68 | Temp 98.3°F | Resp 16 | Ht 62.0 in | Wt 237.5 lb

## 2012-11-20 DIAGNOSIS — K7460 Unspecified cirrhosis of liver: Secondary | ICD-10-CM

## 2012-11-20 DIAGNOSIS — E669 Obesity, unspecified: Secondary | ICD-10-CM | POA: Diagnosis not present

## 2012-11-20 DIAGNOSIS — I8500 Esophageal varices without bleeding: Secondary | ICD-10-CM

## 2012-11-20 NOTE — Progress Notes (Signed)
Presenting complaint;  Followup for cirrhosis and esophageal varices.  Database; Patient is 72 year old Caucasian female with cirrhosis secondary to NAFLD. She had a liver biopsy in May 2012 showing stage IV fibrosis. She underwent EGD on 09/20/2012 revealing 3 columns of grade 3 esophageal varices which were banded. Post procedure she experienced dry hacking cough for few days. She is now here for a scheduled visit. She was switched from atenolol to Nadolol.  Subjective:  She has no complaints. She states she was walking regularly until she developed pain in her knees and now she is trying to use stationary bike. She denies heartburn dysphagia melena rectal bleeding or abdominal pain. She is still working but planning to retire. She denies memory problems or sleep disturbance.    Current Medications: Current Outpatient Prescriptions  Medication Sig Dispense Refill  . ALPRAZolam (XANAX) 0.5 MG tablet Take 0.5 mg by mouth daily as needed for anxiety.       Marland Kitchen aspirin 81 MG tablet Take 81 mg by mouth daily.      . Calcium Carbonate-Vitamin D (CALTRATE 600+D) 600-400 MG-UNIT per tablet Take 1 tablet by mouth daily.        . captopril (CAPOTEN) 25 MG tablet Take 25 mg by mouth 2 (two) times daily.        Marland Kitchen glipiZIDE (GLUCOTROL XL) 5 MG 24 hr tablet Take 5 mg by mouth daily.        . metFORMIN (GLUCOPHAGE-XR) 500 MG 24 hr tablet 4 tablets by mouth daily       . Multiple Vitamins-Minerals (CENTRUM SILVER PO) Take 1 tablet by mouth daily.        . nadolol (CORGARD) 40 MG tablet Take 1 tablet (40 mg total) by mouth daily.  30 tablet  5  . pantoprazole (PROTONIX) 40 MG tablet Take 1 tablet (40 mg total) by mouth daily.  30 tablet  5  . simvastatin (ZOCOR) 40 MG tablet Take 40 mg by mouth daily.        . vitamin B-12 (CYANOCOBALAMIN) 1000 MCG tablet Take 1,000 mcg by mouth daily.       No current facility-administered medications for this visit.    Objective: Blood pressure 128/66, pulse 68,  temperature 98.3 F (36.8 C), temperature source Oral, resp. rate 16, height 5' 2"  (1.575 m), weight 237 lb 8 oz (107.729 kg). Patient is alert and does not have asterixis. Conjunctiva is pink. Sclera is nonicteric Oropharyngeal mucosa is normal. No neck masses or thyromegaly noted. Cardiac exam with regular rhythm normal S1 and S2. No murmur or gallop noted. Lungs are clear to auscultation. Abdomen is obese with midline scar. It is soft and nontender and difficult to palpate spleen or liver.  No LE edema or clubbing noted.  Labs/studies Results:  H. Pylori was positive in August 2014. CBC from 09/14/2012 WBC 2.7, H&H 12.3 and 36.4 and platelet count 67 LFTs from 09/14/2012 Bilirubin 1.1, AP 63, AST 45, ALT 25 and albumin 3.4.  Assessment:  #1. Cirrhosis secondary to NAFLD complicated by large esophageal varices. She underwent variceal banding for primary prophylaxis 2 months ago. Upper GI tract needs to be reexamined and residual or recurrent varices treated. To improve her prognosis and delay other complications she must work harder to lose weight. She unfortunately is not a candidate for OLT given her age. Cirrhosis also complicated by thrombocytopenia and leukopenia. #2. Status post therapy for H. pylori infection in August 2014.   Plan:  Once again patient advised that  she needs to double her effort in order to lose weight. She needs to gradually increase time that she spends using stationary bike. AFP. EGD with possible variceal banding later this month. She will undergo hepatic ultrasound next month. Office visit in 6 months.

## 2012-11-20 NOTE — Patient Instructions (Signed)
Procedure to  band remaining veins in the esophagus to be scheduled sometime next month. Physician will contact you with results of blood work when completed

## 2012-11-22 DIAGNOSIS — E538 Deficiency of other specified B group vitamins: Secondary | ICD-10-CM | POA: Diagnosis not present

## 2012-11-22 DIAGNOSIS — D696 Thrombocytopenia, unspecified: Secondary | ICD-10-CM | POA: Diagnosis not present

## 2012-11-22 DIAGNOSIS — D72819 Decreased white blood cell count, unspecified: Secondary | ICD-10-CM | POA: Diagnosis not present

## 2012-11-22 DIAGNOSIS — D649 Anemia, unspecified: Secondary | ICD-10-CM | POA: Diagnosis not present

## 2012-11-22 DIAGNOSIS — K7460 Unspecified cirrhosis of liver: Secondary | ICD-10-CM | POA: Diagnosis not present

## 2012-11-22 DIAGNOSIS — R748 Abnormal levels of other serum enzymes: Secondary | ICD-10-CM | POA: Diagnosis not present

## 2012-11-22 DIAGNOSIS — C50919 Malignant neoplasm of unspecified site of unspecified female breast: Secondary | ICD-10-CM | POA: Diagnosis not present

## 2012-11-27 ENCOUNTER — Encounter (INDEPENDENT_AMBULATORY_CARE_PROVIDER_SITE_OTHER): Payer: Self-pay | Admitting: *Deleted

## 2012-11-29 ENCOUNTER — Encounter (HOSPITAL_COMMUNITY): Payer: Self-pay | Admitting: Pharmacy Technician

## 2012-11-29 DIAGNOSIS — E538 Deficiency of other specified B group vitamins: Secondary | ICD-10-CM | POA: Diagnosis not present

## 2012-11-29 DIAGNOSIS — D72819 Decreased white blood cell count, unspecified: Secondary | ICD-10-CM | POA: Diagnosis not present

## 2012-11-29 DIAGNOSIS — R748 Abnormal levels of other serum enzymes: Secondary | ICD-10-CM | POA: Diagnosis not present

## 2012-11-29 DIAGNOSIS — D696 Thrombocytopenia, unspecified: Secondary | ICD-10-CM | POA: Diagnosis not present

## 2012-11-29 DIAGNOSIS — K7460 Unspecified cirrhosis of liver: Secondary | ICD-10-CM | POA: Diagnosis not present

## 2012-11-29 DIAGNOSIS — C50919 Malignant neoplasm of unspecified site of unspecified female breast: Secondary | ICD-10-CM | POA: Diagnosis not present

## 2012-12-13 ENCOUNTER — Ambulatory Visit (HOSPITAL_COMMUNITY)
Admission: RE | Admit: 2012-12-13 | Discharge: 2012-12-13 | Disposition: A | Discharge status: Home / Self Care | Payer: Medicare Other | Source: Ambulatory Visit | Attending: Internal Medicine | Admitting: Internal Medicine

## 2012-12-13 ENCOUNTER — Encounter (HOSPITAL_COMMUNITY)
Admission: RE | Disposition: A | Discharge status: Home / Self Care | Payer: Self-pay | Source: Ambulatory Visit | Attending: Internal Medicine

## 2012-12-13 ENCOUNTER — Telehealth (INDEPENDENT_AMBULATORY_CARE_PROVIDER_SITE_OTHER): Payer: Self-pay | Admitting: *Deleted

## 2012-12-13 ENCOUNTER — Encounter (HOSPITAL_COMMUNITY): Payer: Self-pay

## 2012-12-13 DIAGNOSIS — E119 Type 2 diabetes mellitus without complications: Secondary | ICD-10-CM | POA: Diagnosis not present

## 2012-12-13 DIAGNOSIS — K769 Liver disease, unspecified: Secondary | ICD-10-CM | POA: Diagnosis not present

## 2012-12-13 DIAGNOSIS — K766 Portal hypertension: Secondary | ICD-10-CM

## 2012-12-13 DIAGNOSIS — K7460 Unspecified cirrhosis of liver: Secondary | ICD-10-CM | POA: Diagnosis not present

## 2012-12-13 DIAGNOSIS — Z01812 Encounter for preprocedural laboratory examination: Secondary | ICD-10-CM | POA: Diagnosis not present

## 2012-12-13 DIAGNOSIS — I10 Essential (primary) hypertension: Secondary | ICD-10-CM | POA: Diagnosis not present

## 2012-12-13 DIAGNOSIS — I8500 Esophageal varices without bleeding: Secondary | ICD-10-CM

## 2012-12-13 DIAGNOSIS — I8510 Secondary esophageal varices without bleeding: Secondary | ICD-10-CM | POA: Insufficient documentation

## 2012-12-13 HISTORY — PX: ESOPHAGOGASTRODUODENOSCOPY: SHX5428

## 2012-12-13 HISTORY — PX: ESOPHAGEAL BANDING: SHX5518

## 2012-12-13 SURGERY — EGD (ESOPHAGOGASTRODUODENOSCOPY)
Anesthesia: Moderate Sedation

## 2012-12-13 MED ORDER — MEPERIDINE HCL 50 MG/ML IJ SOLN
INTRAMUSCULAR | Status: DC | PRN
  Administered 2012-12-13 (×2): 25 mg via INTRAVENOUS

## 2012-12-13 MED ORDER — MIDAZOLAM HCL 5 MG/5ML IJ SOLN
INTRAMUSCULAR | Status: AC
  Filled 2012-12-13: qty 10

## 2012-12-13 MED ORDER — MIDAZOLAM HCL 5 MG/5ML IJ SOLN
INTRAMUSCULAR | Status: DC | PRN
  Administered 2012-12-13 (×5): 2 mg via INTRAVENOUS

## 2012-12-13 MED ORDER — BUTAMBEN-TETRACAINE-BENZOCAINE 2-2-14 % EX AERO
INHALATION_SPRAY | CUTANEOUS | Status: DC | PRN
  Administered 2012-12-13: 2 via TOPICAL

## 2012-12-13 MED ORDER — SUCRALFATE 1 G PO TABS: 1.0000 g | ORAL_TABLET | Freq: Four times a day (QID) | ORAL | Status: DC

## 2012-12-13 MED ORDER — STERILE WATER FOR IRRIGATION IR SOLN
Status: DC | PRN
  Administered 2012-12-13: 14:00:00

## 2012-12-13 MED ORDER — MEPERIDINE HCL 50 MG/ML IJ SOLN
INTRAMUSCULAR | Status: AC
  Filled 2012-12-13: qty 1

## 2012-12-13 MED ORDER — SODIUM CHLORIDE 0.9 % IV SOLN
INTRAVENOUS | Status: DC
  Administered 2012-12-13: 14:00:00 via INTRAVENOUS

## 2012-12-13 NOTE — Op Note (Signed)
EGD PROCEDURE REPORT  PATIENT:  Cheryl Velasquez  MR#:  [id number] Birthdate:  1940/03/24, 73 y.o., female Endoscopist:  Dr. Rogene Houston, MD Referred By:  Dr. Gar Ponto, MD  Procedure Date: 12/13/2012  Procedure:   EGD with esophageal variceal banding.  Indications:  Patient is 71 year old Caucasian female who is biopsy proven stage IV cirrhosis secondary to NAFLD complicated by grade 3 esophageal varices. These were banded initially on 09/20/2012. She is returning for reevaluation and banding if indicated.            Informed Consent:  The risks, benefits, alternatives & imponderables which include, but are not limited to, bleeding, infection, perforation, drug reaction and potential missed lesion have been reviewed.  The potential for biopsy, lesion removal, esophageal dilation, etc. have also been discussed.  Questions have been answered.  All parties agreeable.  Please see history & physical in medical record for more information.  Medications:  Demerol 50 mg IV Versed 10 mg IV Cetacaine spray topically for oropharyngeal anesthesia  Description of procedure:  The endoscope was introduced through the mouth and advanced to the second portion of the duodenum without difficulty or limitations. The mucosal surfaces were surveyed very carefully during advancement of the scope and upon withdrawal.  Findings:  Esophagus:  Mucosa of the proximal segment was normal. 3 columns of esophageal varices noted 2 were short located distally on the last and the third one at a higher level at 6 o'clock. Scarring noted from previous banding. No stricture identified. GEJ:  37 cm Stomach:  Stomach was empty and distended very well with insufflation. Folds in the proximal stomach were normal. Examination mucosa and body revealed mosaic pattern. Antral mucosa was unremarkable. Pyloric channel was patent. Angularis was also unremarkable. No fundal varices present. Duodenum:  Normal bulbar and post bulbar  mucosa.  Therapeutic/Diagnostic Maneuvers Performed:   Multiple banding device was used. Banding device was loaded onto the scope. It was reintroduced into the esophagus. Band was applied to legs distally but did not take. It was then banded successfully. A second collar was banded slightly at a higher level. The third call was banded at 2 levels. Second multiple banding device had to be opened up.  Complications:  None  Impression: Three columns of esophageal varices banded. One column banded at two levels. Portal gastropathy.  Recommendations:  Sucralfate 1 g a.c. and each bedtime for 2 weeks. Office visit in 3 months.  [name]  12/13/2012  3:03 PM  CC: Dr. Gar Ponto, MD & Dr. Rayne Du ref. provider found\

## 2012-12-13 NOTE — H&P (Signed)
Cheryl Velasquez is an 72 y.o. female.   Chief Complaint: Patient is here for EGD and esophageal variceal banding. HPI: Patient is 72 year old Caucasian female with biopsy proven stage IV cirrhosis secondary to NAFLD who underwent esophageal variceal banding for primary prophylaxis on 09/20/2012. She she's here for repeat esophageal variceal banding if needed. She denies nausea vomiting dysphagia heartburn abdominal pain melena or rectal bleeding. She has not been able to do much walking on account of knee pain   Past Medical History  Diagnosis Date  . History of breast cancer   . History of chicken pox   . Arthritis   . Diabetes mellitus   . Hyperlipidemia   . Hypertension   . Thyroid disease   . Cirrhosis     Past Surgical History  Procedure Laterality Date  . Hernia repair  2004  . Abdominal hysterectomy  1987  . Breast biopsy  2005  . Esophagogastroduodenoscopy N/A 09/20/2012    Procedure: ESOPHAGOGASTRODUODENOSCOPY (EGD);  Surgeon: Rogene Houston, MD;  Location: [facility];  Service: Endoscopy;  Laterality: N/A;  125-moved to 12:00 Ann notified pt    Family History  Problem Relation Age of Onset  . Cancer Other     Colon Cancer, Prostate Cancer-Other Blood Relative  . Hyperlipidemia Other     Other Blood Relative  . Stroke Other     Parent, Other Blood Relative  . Diabetes Other     Parent, Other Blood Relative  . Hypertension Other     Parent  . Prostate cancer Maternal Aunt   . Dementia Maternal Aunt   . Dementia Sister   . Dementia Sister   . Alzheimer's disease Sister   . Liver disease Brother    Social History:  reports that she has never smoked. She has never used smokeless tobacco. She reports that she does not drink alcohol or use illicit drugs.  Allergies: No Known Allergies  Medications Prior to Admission  Medication Sig Dispense Refill  . ALPRAZolam (XANAX) 0.5 MG tablet Take 0.5 mg by mouth daily as needed for anxiety.       Marland Kitchen aspirin 81 MG tablet  Take 81 mg by mouth daily.      . Calcium Carbonate-Vitamin D (CALTRATE 600+D) 600-400 MG-UNIT per tablet Take 1 tablet by mouth daily.        . captopril (CAPOTEN) 25 MG tablet Take 25 mg by mouth 2 (two) times daily.        . cyanocobalamin (,VITAMIN B-12,) 1000 MCG/ML injection Inject 1,000 mcg into the muscle every 30 (thirty) days.      Marland Kitchen glipiZIDE (GLUCOTROL XL) 5 MG 24 hr tablet Take 5 mg by mouth daily.        . hydroxypropyl methylcellulose (ISOPTO TEARS) 2.5 % ophthalmic solution Place 1 drop into both eyes daily as needed (Dry Eyes).      . metFORMIN (GLUCOPHAGE-XR) 500 MG 24 hr tablet Take 2,000 mg by mouth daily with breakfast.       . Multiple Vitamins-Minerals (CENTRUM SILVER PO) Take 1 tablet by mouth daily.        . nadolol (CORGARD) 40 MG tablet Take 1 tablet (40 mg total) by mouth daily.  30 tablet  5  . pantoprazole (PROTONIX) 40 MG tablet Take 1 tablet (40 mg total) by mouth daily.  30 tablet  5  . simvastatin (ZOCOR) 40 MG tablet Take 40 mg by mouth daily.          No  results found for this or any previous visit (from the past 74 hour(s)). No results found.  ROS  Blood pressure 162/78, pulse 83, temperature 98.1 F (36.7 C), temperature source Oral, resp. rate 22, height 5' 2"  (1.575 m), weight 236 lb (107.049 kg), SpO2 95.00%. Physical Exam  Constitutional:  Well-developed obese Caucasian female in NAD.  HENT:  Mouth/Throat: Oropharynx is clear and moist.  Eyes: Conjunctivae are normal. No scleral icterus.  Neck: No thyromegaly present.  Cardiovascular: Normal rate, regular rhythm and normal heart sounds.   No murmur heard. Respiratory: Effort normal and breath sounds normal.  GI:  Protuberant abdomen without tenderness organomegaly or masses  Musculoskeletal: She exhibits no edema.  Lymphadenopathy:    She has no cervical adenopathy.  Neurological: She is alert.  Skin: Skin is warm and dry.     Assessment/Plan Esophageal varices  Cirrhosis secondary to  NAFLD. EGD with esophageal variceal banding(second session).  Kynli Chou U 12/13/2012, 2:19 PM

## 2012-12-13 NOTE — Telephone Encounter (Signed)
Per Dr.Rehman's verbal instruction call Sucralfate 1 gram Take 1 by mouth before meals and 1 by mouth at bedtime for 2 weeks #60 , called this to the [facility].

## 2012-12-14 ENCOUNTER — Encounter (HOSPITAL_COMMUNITY): Payer: Self-pay | Admitting: Internal Medicine

## 2012-12-24 DIAGNOSIS — D649 Anemia, unspecified: Secondary | ICD-10-CM | POA: Diagnosis not present

## 2012-12-24 DIAGNOSIS — K7460 Unspecified cirrhosis of liver: Secondary | ICD-10-CM | POA: Diagnosis not present

## 2012-12-24 DIAGNOSIS — D696 Thrombocytopenia, unspecified: Secondary | ICD-10-CM | POA: Diagnosis not present

## 2012-12-24 DIAGNOSIS — C50919 Malignant neoplasm of unspecified site of unspecified female breast: Secondary | ICD-10-CM | POA: Diagnosis not present

## 2012-12-24 DIAGNOSIS — D72819 Decreased white blood cell count, unspecified: Secondary | ICD-10-CM | POA: Diagnosis not present

## 2012-12-24 DIAGNOSIS — E538 Deficiency of other specified B group vitamins: Secondary | ICD-10-CM | POA: Diagnosis not present

## 2013-01-23 DIAGNOSIS — E538 Deficiency of other specified B group vitamins: Secondary | ICD-10-CM

## 2013-01-23 DIAGNOSIS — D696 Thrombocytopenia, unspecified: Secondary | ICD-10-CM | POA: Diagnosis not present

## 2013-01-23 DIAGNOSIS — K7460 Unspecified cirrhosis of liver: Secondary | ICD-10-CM | POA: Diagnosis not present

## 2013-01-23 DIAGNOSIS — D72819 Decreased white blood cell count, unspecified: Secondary | ICD-10-CM

## 2013-01-23 DIAGNOSIS — C50919 Malignant neoplasm of unspecified site of unspecified female breast: Secondary | ICD-10-CM | POA: Diagnosis not present

## 2013-02-05 DIAGNOSIS — K7460 Unspecified cirrhosis of liver: Secondary | ICD-10-CM | POA: Diagnosis not present

## 2013-02-05 DIAGNOSIS — Z9889 Other specified postprocedural states: Secondary | ICD-10-CM | POA: Diagnosis not present

## 2013-02-05 DIAGNOSIS — C50919 Malignant neoplasm of unspecified site of unspecified female breast: Secondary | ICD-10-CM | POA: Diagnosis not present

## 2013-02-05 DIAGNOSIS — R978 Other abnormal tumor markers: Secondary | ICD-10-CM | POA: Diagnosis not present

## 2013-02-05 DIAGNOSIS — E538 Deficiency of other specified B group vitamins: Secondary | ICD-10-CM | POA: Diagnosis not present

## 2013-02-05 DIAGNOSIS — B1920 Unspecified viral hepatitis C without hepatic coma: Secondary | ICD-10-CM | POA: Diagnosis not present

## 2013-02-19 ENCOUNTER — Ambulatory Visit (INDEPENDENT_AMBULATORY_CARE_PROVIDER_SITE_OTHER): Payer: Medicare Other | Admitting: Internal Medicine

## 2013-02-22 DIAGNOSIS — E538 Deficiency of other specified B group vitamins: Secondary | ICD-10-CM | POA: Diagnosis not present

## 2013-03-04 DIAGNOSIS — Z1231 Encounter for screening mammogram for malignant neoplasm of breast: Secondary | ICD-10-CM | POA: Diagnosis not present

## 2013-03-12 ENCOUNTER — Other Ambulatory Visit (HOSPITAL_COMMUNITY): Payer: Self-pay | Admitting: Internal Medicine

## 2013-03-13 ENCOUNTER — Encounter: Payer: Self-pay | Admitting: Endocrinology

## 2013-03-13 ENCOUNTER — Ambulatory Visit (INDEPENDENT_AMBULATORY_CARE_PROVIDER_SITE_OTHER): Payer: Medicare Other | Admitting: Endocrinology

## 2013-03-13 VITALS — BP 112/72 | HR 77 | Temp 97.5°F | Ht 62.0 in | Wt 239.0 lb

## 2013-03-13 DIAGNOSIS — E0590 Thyrotoxicosis, unspecified without thyrotoxic crisis or storm: Secondary | ICD-10-CM

## 2013-03-13 DIAGNOSIS — E042 Nontoxic multinodular goiter: Secondary | ICD-10-CM | POA: Diagnosis not present

## 2013-03-13 LAB — TSH: TSH: 1.48 u[IU]/mL (ref 0.35–5.50)

## 2013-03-13 NOTE — Progress Notes (Signed)
Subjective:    Patient ID: Cheryl Velasquez, female    DOB: [birth year], 73 y.o.   MRN: [id number]  HPI Pt had i-131 rx for hyperthyroidism due to multinodular goiter, in march of 2013.  TSH normalized, and has stayed normal.  She presented in 2013 with a thyroid nodule, cold on scan.  bx was benign, prior to the i-131 rx.  pt states she feels well in general.   Past Medical History  Diagnosis Date  . History of breast cancer   . History of chicken pox   . Arthritis   . Diabetes mellitus   . Hyperlipidemia   . Hypertension   . Thyroid disease   . Cirrhosis     Past Surgical History  Procedure Laterality Date  . Hernia repair  2004  . Abdominal hysterectomy  1987  . Breast biopsy  2005  . Esophagogastroduodenoscopy N/A 09/20/2012    Procedure: ESOPHAGOGASTRODUODENOSCOPY (EGD);  Surgeon: Rogene Houston, MD;  Location: [facility];  Service: Endoscopy;  Laterality: N/A;  125-moved to 12:00 Ann notified pt  . Esophagogastroduodenoscopy N/A 12/13/2012    Procedure: ESOPHAGOGASTRODUODENOSCOPY (EGD);  Surgeon: Rogene Houston, MD;  Location: [facility];  Service: Endoscopy;  Laterality: N/A;  155-moved to 220 Ann to notify pt  . Esophageal banding N/A 12/13/2012    Procedure: ESOPHAGEAL BANDING;  Surgeon: Rogene Houston, MD;  Location: [facility];  Service: Endoscopy;  Laterality: N/A;    History   Social History  . Marital Status: Single    Spouse Name: N/A    Number of Children: 0  . Years of Education: 10   Occupational History  . Textiles    Social History Main Topics  . Smoking status: Never Smoker   . Smokeless tobacco: Never Used  . Alcohol Use: No  . Drug Use: No  . Sexual Activity: Not on file   Other Topics Concern  . Not on file   Social History Narrative   Regular Exercise-no    Current Outpatient Prescriptions on File Prior to Visit  Medication Sig Dispense Refill  . ALPRAZolam (XANAX) 0.5 MG tablet Take 0.5 mg by mouth daily as needed for  anxiety.       Marland Kitchen aspirin 81 MG tablet Take 81 mg by mouth daily.      . Calcium Carbonate-Vitamin D (CALTRATE 600+D) 600-400 MG-UNIT per tablet Take 1 tablet by mouth daily.        . captopril (CAPOTEN) 25 MG tablet Take 25 mg by mouth 2 (two) times daily.        . cyanocobalamin (,VITAMIN B-12,) 1000 MCG/ML injection Inject 1,000 mcg into the muscle every 30 (thirty) days.      Marland Kitchen glipiZIDE (GLUCOTROL XL) 5 MG 24 hr tablet Take 5 mg by mouth daily.        . hydroxypropyl methylcellulose (ISOPTO TEARS) 2.5 % ophthalmic solution Place 1 drop into both eyes daily as needed (Dry Eyes).      . metFORMIN (GLUCOPHAGE-XR) 500 MG 24 hr tablet Take 2,000 mg by mouth daily with breakfast.       . Multiple Vitamins-Minerals (CENTRUM SILVER PO) Take 1 tablet by mouth daily.        . nadolol (CORGARD) 40 MG tablet TAKE 1 TABLET BY MOUTH EVERY DAY  30 tablet  5  . pantoprazole (PROTONIX) 40 MG tablet TAKE 1 TABLET BY MOUTH EVERY DAY  30 tablet  5  . simvastatin (ZOCOR) 40 MG tablet  Take 40 mg by mouth daily.        . sucralfate (CARAFATE) 1 G tablet Take 1 tablet (1 g total) by mouth 4 (four) times daily.  60 tablet  0   No current facility-administered medications on file prior to visit.    No Known Allergies  Family History  Problem Relation Age of Onset  . Cancer Other     Colon Cancer, Prostate Cancer-Other Blood Relative  . Hyperlipidemia Other     Other Blood Relative  . Stroke Other     Parent, Other Blood Relative  . Diabetes Other     Parent, Other Blood Relative  . Hypertension Other     Parent  . Prostate cancer Maternal Aunt   . Dementia Maternal Aunt   . Dementia Sister   . Dementia Sister   . Alzheimer's disease Sister   . Liver disease Brother     BP 112/72  Pulse 77  Temp(Src) 97.5 F (36.4 C) (Oral)  Ht 5' 2"  (1.575 m)  Wt 239 lb (108.41 kg)  BMI 43.70 kg/m2  SpO2 97%   Review of Systems Denies weight change    Objective:   Physical Exam VITAL SIGNS:  See vs  page GENERAL: no distress NECK: There is no palpable thyroid enlargement.  No thyroid nodule is palpable.  No palpable lymphadenopathy at the anterior neck.    Lab Results  Component Value Date   TSH 1.48 03/13/2013      Assessment & Plan:  Multinodular goiter.  She is due for [place] recheck. Hyperthyroidism: resolved with i-131 rx, but she is at risk for recurrence.

## 2013-03-13 NOTE — Patient Instructions (Signed)
A thyroid blood test is requested for you today.  We'll contact you with results. Also, let's recheck the ultrasound.  you will receive a phone call, about a day and time for an appointment.   Please come back for a follow-up appointment in 1 year. most of the time, a "lumpy thyroid" will eventually become overactive again.  this is usually a slow process, happening over the span of many years.

## 2013-03-20 DIAGNOSIS — E041 Nontoxic single thyroid nodule: Secondary | ICD-10-CM | POA: Diagnosis not present

## 2013-03-20 DIAGNOSIS — E042 Nontoxic multinodular goiter: Secondary | ICD-10-CM | POA: Diagnosis not present

## 2013-03-26 DIAGNOSIS — D696 Thrombocytopenia, unspecified: Secondary | ICD-10-CM | POA: Diagnosis not present

## 2013-03-26 DIAGNOSIS — E538 Deficiency of other specified B group vitamins: Secondary | ICD-10-CM | POA: Diagnosis not present

## 2013-03-26 DIAGNOSIS — C50919 Malignant neoplasm of unspecified site of unspecified female breast: Secondary | ICD-10-CM | POA: Diagnosis not present

## 2013-03-26 DIAGNOSIS — D72819 Decreased white blood cell count, unspecified: Secondary | ICD-10-CM | POA: Diagnosis not present

## 2013-04-03 ENCOUNTER — Telehealth: Payer: Self-pay | Admitting: Endocrinology

## 2013-04-03 NOTE — Telephone Encounter (Signed)
Pt informed

## 2013-04-03 NOTE — Telephone Encounter (Signed)
please call patient: [place]: no change--good i'll see you next time.

## 2013-04-12 DIAGNOSIS — J40 Bronchitis, not specified as acute or chronic: Secondary | ICD-10-CM | POA: Diagnosis not present

## 2013-05-16 ENCOUNTER — Encounter: Payer: Self-pay | Admitting: Endocrinology

## 2013-05-23 DIAGNOSIS — E039 Hypothyroidism, unspecified: Secondary | ICD-10-CM | POA: Diagnosis not present

## 2013-05-23 DIAGNOSIS — I10 Essential (primary) hypertension: Secondary | ICD-10-CM | POA: Diagnosis not present

## 2013-05-23 DIAGNOSIS — IMO0001 Reserved for inherently not codable concepts without codable children: Secondary | ICD-10-CM | POA: Diagnosis not present

## 2013-05-23 DIAGNOSIS — E782 Mixed hyperlipidemia: Secondary | ICD-10-CM | POA: Diagnosis not present

## 2013-05-27 ENCOUNTER — Ambulatory Visit (INDEPENDENT_AMBULATORY_CARE_PROVIDER_SITE_OTHER): Payer: Medicare Other | Admitting: Internal Medicine

## 2013-05-27 ENCOUNTER — Encounter (INDEPENDENT_AMBULATORY_CARE_PROVIDER_SITE_OTHER): Payer: Self-pay | Admitting: Internal Medicine

## 2013-05-27 VITALS — BP 124/70 | HR 76 | Temp 98.3°F | Resp 18 | Ht 62.0 in | Wt 243.5 lb

## 2013-05-27 DIAGNOSIS — K7460 Unspecified cirrhosis of liver: Secondary | ICD-10-CM

## 2013-05-27 DIAGNOSIS — I8500 Esophageal varices without bleeding: Secondary | ICD-10-CM | POA: Diagnosis not present

## 2013-05-27 LAB — PROTIME-INR
INR: 1.13 (ref <1.50)
PROTHROMBIN TIME: 14.4 s (ref 11.6–15.2)

## 2013-05-27 NOTE — Patient Instructions (Addendum)
Remember to drink 2 cups of coffee daily. Take vitamin C 500 mg daily and vitamin E 400 units daily. Physician will call with results of blood work and ultrasound when completed. These talk with [name], [facility] about need for dietary consultation

## 2013-05-27 NOTE — Progress Notes (Signed)
Presenting complaint;  Followup for cirrhosis.  Database;  Patient is 73 year old Caucasian female who has a biopsy-proven stage IV cirrhosis secondary to NAFLD. She had liver biopsy in May 2012. This condition was diagnosed and she had abdominal CT for right-sided abdominal pain after a fall. Mitochondrial antibody was positive at low titer but biopsy did not show typical changes of PBC. She has undergone esophageal variceal banding twice. First session was on 09/20/2012 and a second session was on 12/13/2012. This was done for primary prophylaxis. Last  ultrasound was in May 2014 at [facility] and she did not have study last fall as recommended.   Subjective:  Patient is here for scheduled visit. She has no complaints. She states she is exercising and tries to walk every day. She has gained 6 pounds since her last visit 6 months ago. She denies nausea vomiting heartburn dysphagia or abdominal pain. She also denies lower extremity edema. Bowels move regularly and there is no history of melena or rectal bleeding. She had blood work by PCP one week ago which is reviewed below.   Current Medications: Outpatient Encounter Prescriptions as of 05/27/2013  Medication Sig  . ALPRAZolam (XANAX) 0.5 MG tablet Take 0.5 mg by mouth daily as needed for anxiety.   Marland Kitchen aspirin 81 MG tablet Take 81 mg by mouth daily.  . Calcium Carbonate-Vitamin D (CALTRATE 600+D) 600-400 MG-UNIT per tablet Take 1 tablet by mouth daily.    . captopril (CAPOTEN) 25 MG tablet Take 25 mg by mouth 2 (two) times daily.    Marland Kitchen glipiZIDE (GLUCOTROL XL) 5 MG 24 hr tablet Take 5 mg by mouth daily.    . hydroxypropyl methylcellulose (ISOPTO TEARS) 2.5 % ophthalmic solution Place 1 drop into both eyes daily as needed (Dry Eyes).  . metFORMIN (GLUCOPHAGE-XR) 500 MG 24 hr tablet Take 2,000 mg by mouth daily with breakfast.   . Multiple Vitamins-Minerals (CENTRUM SILVER PO) Take 1 tablet by mouth daily.    . nadolol (CORGARD) 40 MG tablet TAKE  1 TABLET BY MOUTH EVERY DAY  . pantoprazole (PROTONIX) 40 MG tablet TAKE 1 TABLET BY MOUTH EVERY DAY  . simvastatin (ZOCOR) 40 MG tablet Take 40 mg by mouth daily.    . [DISCONTINUED] cyanocobalamin (,VITAMIN B-12,) 1000 MCG/ML injection Inject 1,000 mcg into the muscle every 30 (thirty) days.  . [DISCONTINUED] sucralfate (CARAFATE) 1 G tablet Take 1 tablet (1 g total) by mouth 4 (four) times daily.     Objective: Blood pressure 124/70, pulse 76, temperature 98.3 F (36.8 C), temperature source Oral, resp. rate 18, height 5' 2"  (1.575 m), weight 243 lb 8 oz (110.451 kg). Patient is alert and does not have asterixis. Conjunctiva is pink. Sclera is nonicteric Oropharyngeal mucosa is normal. No neck masses or thyromegaly noted. Cardiac exam with regular rhythm normal S1 and S2. No murmur or gallop noted. Lungs are clear to auscultation. Abdomen is very obese soft and nontender. No organomegaly or masses but difficult to palpate her liver or spleen given body habitus. No LE edema or clubbing noted.  Labs/studies Results: Lab data from 05/21/2013. Serum sodium 139, potassium 4.7, chloride 101, CO2 23, BUN 9, creatinine 0.74 and glucose 162 Bilirubin 0.6, BP 65, AST 33, ALT 21 and albumin 3.5. Serum calcium 9.1 WBC 2.6, H&H 10.7 and 33.6 and platelet count 75K Hemoglobin A1c 7.7  Assessment:   Cirrhosis secondary to NAFLD complicated by thrombocytopenia and esophageal varices. She has undergone esophageal variceal banding twice for primary prophylaxis. Patient is  at risk for progressive disease and poorly controlled diabetes mellitus and her inability to exercise and lose weight. She may benefit from consultation with dietitian but  I will leave it up to PCP. She is due for [facility] screening. Will need to check INR in order to calculate Meld score.    Plan:  Hepatic ultrasound for [place] screening. Patient will go to the lab for AFP and INR. She will discuss whether or not she would  benefit from dietary consultation when she sees [employer] PA-C later this week. Office visit in 6 months.

## 2013-05-28 LAB — AFP TUMOR MARKER: AFP TUMOR MARKER: 9.7 ng/mL — AB (ref 0.0–8.0)

## 2013-05-30 DIAGNOSIS — E039 Hypothyroidism, unspecified: Secondary | ICD-10-CM | POA: Diagnosis not present

## 2013-05-30 DIAGNOSIS — D649 Anemia, unspecified: Secondary | ICD-10-CM | POA: Diagnosis not present

## 2013-05-30 DIAGNOSIS — D696 Thrombocytopenia, unspecified: Secondary | ICD-10-CM | POA: Diagnosis not present

## 2013-05-30 DIAGNOSIS — I10 Essential (primary) hypertension: Secondary | ICD-10-CM | POA: Diagnosis not present

## 2013-05-30 DIAGNOSIS — IMO0001 Reserved for inherently not codable concepts without codable children: Secondary | ICD-10-CM | POA: Diagnosis not present

## 2013-05-30 DIAGNOSIS — F411 Generalized anxiety disorder: Secondary | ICD-10-CM | POA: Diagnosis not present

## 2013-05-30 DIAGNOSIS — E782 Mixed hyperlipidemia: Secondary | ICD-10-CM | POA: Diagnosis not present

## 2013-05-30 DIAGNOSIS — IMO0002 Reserved for concepts with insufficient information to code with codable children: Secondary | ICD-10-CM | POA: Diagnosis not present

## 2013-06-03 DIAGNOSIS — K7460 Unspecified cirrhosis of liver: Secondary | ICD-10-CM | POA: Diagnosis not present

## 2013-06-03 DIAGNOSIS — K8020 Calculus of gallbladder without cholecystitis without obstruction: Secondary | ICD-10-CM | POA: Diagnosis not present

## 2013-06-25 DIAGNOSIS — R609 Edema, unspecified: Secondary | ICD-10-CM | POA: Diagnosis not present

## 2013-06-25 DIAGNOSIS — J40 Bronchitis, not specified as acute or chronic: Secondary | ICD-10-CM | POA: Diagnosis not present

## 2013-07-22 DIAGNOSIS — D0460 Carcinoma in situ of skin of unspecified upper limb, including shoulder: Secondary | ICD-10-CM | POA: Diagnosis not present

## 2013-07-22 DIAGNOSIS — Z85828 Personal history of other malignant neoplasm of skin: Secondary | ICD-10-CM | POA: Diagnosis not present

## 2013-07-22 DIAGNOSIS — D485 Neoplasm of uncertain behavior of skin: Secondary | ICD-10-CM | POA: Diagnosis not present

## 2013-07-22 DIAGNOSIS — L570 Actinic keratosis: Secondary | ICD-10-CM | POA: Diagnosis not present

## 2013-08-01 DIAGNOSIS — C44621 Squamous cell carcinoma of skin of unspecified upper limb, including shoulder: Secondary | ICD-10-CM | POA: Diagnosis not present

## 2013-08-20 ENCOUNTER — Encounter (INDEPENDENT_AMBULATORY_CARE_PROVIDER_SITE_OTHER): Payer: Self-pay | Admitting: *Deleted

## 2013-09-02 ENCOUNTER — Telehealth: Payer: Self-pay

## 2013-09-02 NOTE — Telephone Encounter (Signed)
Called pt several times... Pt hung up on call each time. I believe the pt has difficulty hearing and that is why she hung up.

## 2013-09-04 ENCOUNTER — Other Ambulatory Visit (HOSPITAL_COMMUNITY): Payer: Self-pay | Admitting: Internal Medicine

## 2013-09-08 ENCOUNTER — Other Ambulatory Visit (HOSPITAL_COMMUNITY): Payer: Self-pay | Admitting: Internal Medicine

## 2013-09-24 DIAGNOSIS — R609 Edema, unspecified: Secondary | ICD-10-CM | POA: Diagnosis not present

## 2013-09-24 DIAGNOSIS — R0602 Shortness of breath: Secondary | ICD-10-CM | POA: Diagnosis not present

## 2013-09-24 DIAGNOSIS — J40 Bronchitis, not specified as acute or chronic: Secondary | ICD-10-CM | POA: Diagnosis not present

## 2013-09-30 DIAGNOSIS — C50919 Malignant neoplasm of unspecified site of unspecified female breast: Secondary | ICD-10-CM | POA: Diagnosis not present

## 2013-09-30 DIAGNOSIS — E538 Deficiency of other specified B group vitamins: Secondary | ICD-10-CM | POA: Diagnosis not present

## 2013-09-30 DIAGNOSIS — D509 Iron deficiency anemia, unspecified: Secondary | ICD-10-CM | POA: Diagnosis not present

## 2013-09-30 DIAGNOSIS — D696 Thrombocytopenia, unspecified: Secondary | ICD-10-CM | POA: Diagnosis not present

## 2013-10-01 DIAGNOSIS — IMO0001 Reserved for inherently not codable concepts without codable children: Secondary | ICD-10-CM | POA: Diagnosis not present

## 2013-10-01 DIAGNOSIS — D649 Anemia, unspecified: Secondary | ICD-10-CM | POA: Diagnosis not present

## 2013-10-01 DIAGNOSIS — E039 Hypothyroidism, unspecified: Secondary | ICD-10-CM | POA: Diagnosis not present

## 2013-10-07 DIAGNOSIS — D509 Iron deficiency anemia, unspecified: Secondary | ICD-10-CM | POA: Diagnosis not present

## 2013-10-07 DIAGNOSIS — D508 Other iron deficiency anemias: Secondary | ICD-10-CM | POA: Diagnosis not present

## 2013-10-09 DIAGNOSIS — IMO0002 Reserved for concepts with insufficient information to code with codable children: Secondary | ICD-10-CM | POA: Diagnosis not present

## 2013-10-09 DIAGNOSIS — I509 Heart failure, unspecified: Secondary | ICD-10-CM | POA: Diagnosis not present

## 2013-10-09 DIAGNOSIS — IMO0001 Reserved for inherently not codable concepts without codable children: Secondary | ICD-10-CM | POA: Diagnosis not present

## 2013-10-09 DIAGNOSIS — I10 Essential (primary) hypertension: Secondary | ICD-10-CM | POA: Diagnosis not present

## 2013-10-09 DIAGNOSIS — E782 Mixed hyperlipidemia: Secondary | ICD-10-CM | POA: Diagnosis not present

## 2013-10-09 DIAGNOSIS — D696 Thrombocytopenia, unspecified: Secondary | ICD-10-CM | POA: Diagnosis not present

## 2013-10-09 DIAGNOSIS — E039 Hypothyroidism, unspecified: Secondary | ICD-10-CM | POA: Diagnosis not present

## 2013-10-09 DIAGNOSIS — F411 Generalized anxiety disorder: Secondary | ICD-10-CM | POA: Diagnosis not present

## 2013-10-11 ENCOUNTER — Ambulatory Visit (INDEPENDENT_AMBULATORY_CARE_PROVIDER_SITE_OTHER): Payer: Medicare Other | Admitting: Cardiology

## 2013-10-11 ENCOUNTER — Encounter: Payer: Self-pay | Admitting: Cardiology

## 2013-10-11 VITALS — BP 141/83 | HR 79 | Ht 62.0 in | Wt 243.0 lb

## 2013-10-11 DIAGNOSIS — R0602 Shortness of breath: Secondary | ICD-10-CM | POA: Diagnosis not present

## 2013-10-11 DIAGNOSIS — I10 Essential (primary) hypertension: Secondary | ICD-10-CM

## 2013-10-11 NOTE — Progress Notes (Signed)
Clinical Summary Cheryl Velasquez is a 73 y.o.female seen today as a new patient.   1. SOB/DOE - chronic SOB, has worsened over the last few months - DOE with walking short distances. Has had some LE edema as well. Started on lasix a few month ago.  - no orthopnea, no PND - no chest pain, no palpitations.  2. Hx of breast CA - hx of breast CA treated with radiation, arimidex  3. Hypothyroidism - followed by pcp, last TSH 06/2013 was 3.420.  - compliant with synthroid  4. Cirrhosis - followed by GI  7. Chronic anemia - received regular injection of IV iron   Past Medical History  Diagnosis Date  . History of breast cancer   . History of chicken pox   . Arthritis   . Diabetes mellitus   . Hyperlipidemia   . Hypertension   . Thyroid disease   . Cirrhosis      No Known Allergies   Current Outpatient Prescriptions  Medication Sig Dispense Refill  . ALPRAZolam (XANAX) 0.5 MG tablet Take 0.5 mg by mouth daily as needed for anxiety.       Marland Kitchen aspirin 81 MG tablet Take 81 mg by mouth daily.      . Calcium Carbonate-Vitamin D (CALTRATE 600+D) 600-400 MG-UNIT per tablet Take 1 tablet by mouth daily.        . captopril (CAPOTEN) 25 MG tablet Take 25 mg by mouth 2 (two) times daily.        Marland Kitchen glipiZIDE (GLUCOTROL XL) 5 MG 24 hr tablet Take 5 mg by mouth daily.        . hydroxypropyl methylcellulose (ISOPTO TEARS) 2.5 % ophthalmic solution Place 1 drop into both eyes daily as needed (Dry Eyes).      . metFORMIN (GLUCOPHAGE-XR) 500 MG 24 hr tablet Take 2,000 mg by mouth daily with breakfast.       . Multiple Vitamins-Minerals (CENTRUM SILVER PO) Take 1 tablet by mouth daily.        . nadolol (CORGARD) 40 MG tablet TAKE 1 TABLET BY MOUTH EVERY DAY  30 tablet  5  . nadolol (CORGARD) 40 MG tablet TAKE 1 TABLET BY MOUTH EVERY DAY  30 tablet  5  . pantoprazole (PROTONIX) 40 MG tablet TAKE 1 TABLET BY MOUTH EVERY DAY  30 tablet  5  . pantoprazole (PROTONIX) 40 MG tablet TAKE 1 TABLET BY  MOUTH EVERY DAY  30 tablet  5  . simvastatin (ZOCOR) 40 MG tablet Take 40 mg by mouth daily.         No current facility-administered medications for this visit.     Past Surgical History  Procedure Laterality Date  . Hernia repair  2004  . Abdominal hysterectomy  1987  . Breast biopsy  2005  . Esophagogastroduodenoscopy N/A 09/20/2012    Procedure: ESOPHAGOGASTRODUODENOSCOPY (EGD);  Surgeon: Rogene Houston, MD;  [facility]: [facility];  Service: Endoscopy;  Laterality: N/A;  125-moved to 12:00 Ann notified pt  . Esophagogastroduodenoscopy N/A 12/13/2012    Procedure: ESOPHAGOGASTRODUODENOSCOPY (EGD);  Surgeon: Rogene Houston, MD;  Location: [facility];  Service: Endoscopy;  Laterality: N/A;  155-moved to 220 Ann to notify pt  . Esophageal banding N/A 12/13/2012    Procedure: ESOPHAGEAL BANDING;  Surgeon: Rogene Houston, MD;  Location: [facility];  Service: Endoscopy;  Laterality: N/A;     No Known Allergies    Family History  Problem Relation Age of  Onset  . Cancer Other     Colon Cancer, Prostate Cancer-Other Blood Relative  . Hyperlipidemia Other     Other Blood Relative  . Stroke Other     Parent, Other Blood Relative  . Diabetes Other     Parent, Other Blood Relative  . Hypertension Other     Parent  . Prostate cancer Maternal Aunt   . Dementia Maternal Aunt   . Dementia Sister   . Dementia Sister   . Alzheimer's disease Sister   . Liver disease Brother      Social History [name] reports that she has never smoked. She has never used smokeless tobacco. [name] reports that she does not drink alcohol.   Review of Systems CONSTITUTIONAL: No weight loss, fever, chills, weakness or fatigue.  HEENT: Eyes: No visual loss, blurred vision, double vision or yellow sclerae.No hearing loss, sneezing, congestion, runny nose or sore throat.  SKIN: No rash or itching.  CARDIOVASCULAR: per HPI RESPIRATORY: +SOB  GASTROINTESTINAL: No anorexia, nausea,  vomiting or diarrhea. No abdominal pain or blood.  GENITOURINARY: No burning on urination, no polyuria NEUROLOGICAL: No headache, dizziness, syncope, paralysis, ataxia, numbness or tingling in the extremities. No change in bowel or bladder control.  MUSCULOSKELETAL: No muscle, back pain, joint pain or stiffness.  LYMPHATICS: No enlarged nodes. No history of splenectomy.  PSYCHIATRIC: No history of depression or anxiety.  ENDOCRINOLOGIC: No reports of sweating, cold or heat intolerance. No polyuria or polydipsia.  Marland Kitchen   Physical Examination p 79 bp 141/83 Wt 243 lbs BMI 44 Gen: resting comfortably, no acute distress HEENT: no scleral icterus, pupils equal round and reactive, no palptable cervical adenopathy,  CV: RRR, no m/r/g, no JVD, no carotid bruits Resp: Clear to auscultation bilaterally GI: abdomen is soft, non-tender, non-distended, normal bowel sounds, no hepatosplenomegaly MSK: extremities are warm, no edema.  Skin: warm, no rash Neuro:  no focal deficits Psych: appropriate affect   Diagnostic Studies  EKG:NSR   Assessment and Plan  1. SOB/DOE - no clear cause at this time - she has risk factors for cardiac disease/dysfunction including multiple CAD risk factors, hx of breast CA with prior chest irradiation, and cirrhosis with associated risk of pulm HTN and RV dysfunction - will order TTE to further evaluate.  2. HTN - continue current meds  F/u 3-4 weeks  Arnoldo Lenis, M.D., F.A.C.C.

## 2013-10-11 NOTE — Patient Instructions (Signed)
Your physician has requested that you have an echocardiogram. Echocardiography is a painless test that uses sound waves to create images of your heart. It provides your doctor with information about the size and shape of your heart and how well your heart's chambers and valves are working. This procedure takes approximately one hour. There are no restrictions for this procedure. Office will contact with results via phone or letter.   Continue all current medications. Follow up in  3-4 weeks

## 2013-10-14 DIAGNOSIS — D508 Other iron deficiency anemias: Secondary | ICD-10-CM | POA: Diagnosis not present

## 2013-10-30 ENCOUNTER — Other Ambulatory Visit (INDEPENDENT_AMBULATORY_CARE_PROVIDER_SITE_OTHER): Payer: Medicare Other

## 2013-10-30 ENCOUNTER — Other Ambulatory Visit: Payer: Self-pay

## 2013-10-30 DIAGNOSIS — I059 Rheumatic mitral valve disease, unspecified: Secondary | ICD-10-CM

## 2013-10-30 DIAGNOSIS — R0602 Shortness of breath: Secondary | ICD-10-CM | POA: Diagnosis not present

## 2013-10-30 DIAGNOSIS — I509 Heart failure, unspecified: Secondary | ICD-10-CM

## 2013-11-01 ENCOUNTER — Other Ambulatory Visit: Payer: Self-pay | Admitting: *Deleted

## 2013-11-01 ENCOUNTER — Encounter: Payer: Self-pay | Admitting: Cardiology

## 2013-11-01 ENCOUNTER — Ambulatory Visit (INDEPENDENT_AMBULATORY_CARE_PROVIDER_SITE_OTHER): Payer: Medicare Other | Admitting: Cardiology

## 2013-11-01 VITALS — BP 148/86 | HR 75 | Ht 62.0 in | Wt 244.0 lb

## 2013-11-01 DIAGNOSIS — I10 Essential (primary) hypertension: Secondary | ICD-10-CM | POA: Diagnosis not present

## 2013-11-01 DIAGNOSIS — R0602 Shortness of breath: Secondary | ICD-10-CM | POA: Diagnosis not present

## 2013-11-01 NOTE — Patient Instructions (Signed)
Your physician has recommended that you have a pulmonary function test. Pulmonary Function Tests are a group of tests that measure how well air moves in and out of your lungs. Office will contact with results via phone or letter.   Continue all current medications. Your physician wants you to follow up in: 6 months.  You will receive a reminder letter in the mail one-two months in advance.  If you don't receive a letter, please call our office to schedule the follow up appointment

## 2013-11-01 NOTE — Progress Notes (Signed)
Clinical Summary Cheryl Velasquez is a 73 y.o.female seen today for follow up of the following medical problems.   1. SOB/DOE  - chronic SOB, has worsened over the last few months  - DOE with walking short distances. Has had some LE edema as well. Started on lasix a few month ago.  - no orthopnea, no PND  - no chest pain, no palpitations.  - echo 10/30/13 LVEF 55-60%, grade II diastolic dysfunction - breathing improved since last visit - no history of tobacco, strong history of second hand smoke exposure   2. Hx of breast CA  - hx of breast CA treated with radiation, arimidex   3. Hypothyroidism  - followed by pcp, last TSH 06/2013 was 3.420.  - compliant with synthroid   4. Cirrhosis  - followed by GI  - reports ASA has been stopped by GI doctor  5. Chronic anemia  - received regular injection of IV iron  6. HTN - does not check at home - compliant with meds  Past Medical History  Diagnosis Date  . History of breast cancer   . History of chicken pox   . Arthritis   . Diabetes mellitus   . Hyperlipidemia   . Hypertension   . Thyroid disease   . Cirrhosis      No Known Allergies   Current Outpatient Prescriptions  Medication Sig Dispense Refill  . albuterol (PROAIR HFA) 108 (90 BASE) MCG/ACT inhaler Inhale 2 puffs into the lungs every 6 (six) hours as needed for wheezing or shortness of breath.      . ALPRAZolam (XANAX) 0.5 MG tablet Take 0.5 mg by mouth 2 (two) times daily as needed for anxiety.       . Calcium Carbonate-Vitamin D (CALTRATE 600+D) 600-400 MG-UNIT per tablet Take 1 tablet by mouth daily.        . captopril (CAPOTEN) 25 MG tablet Take 25 mg by mouth 2 (two) times daily.        . furosemide (LASIX) 20 MG tablet Take 1 tablet by mouth daily.      Marland Kitchen glipiZIDE (GLUCOTROL XL) 5 MG 24 hr tablet Take 5 mg by mouth daily.        . hydroxypropyl methylcellulose (ISOPTO TEARS) 2.5 % ophthalmic solution Place 1 drop into both eyes daily as needed (Dry Eyes).       . metFORMIN (GLUCOPHAGE-XR) 500 MG 24 hr tablet Take 1,000 mg by mouth 2 (two) times daily.       . Multiple Vitamins-Minerals (CENTRUM SILVER PO) Take 1 tablet by mouth daily.        . nadolol (CORGARD) 40 MG tablet TAKE 1 TABLET BY MOUTH EVERY DAY  30 tablet  5  . oxymetazoline (AFRIN) 0.05 % nasal spray Place 1 spray into both nostrils 2 (two) times daily as needed for congestion.      . pantoprazole (PROTONIX) 40 MG tablet TAKE 1 TABLET BY MOUTH EVERY DAY  30 tablet  5  . simvastatin (ZOCOR) 40 MG tablet Take 40 mg by mouth daily.        . vitamin C (ASCORBIC ACID) 500 MG tablet Take 500 mg by mouth daily.      . vitamin E 400 UNIT capsule Take 400 Units by mouth daily.       No current facility-administered medications for this visit.     Past Surgical History  Procedure Laterality Date  . Hernia repair  2004  . Abdominal hysterectomy  1987  . Breast biopsy  2005  . Esophagogastroduodenoscopy N/A 09/20/2012    Procedure: ESOPHAGOGASTRODUODENOSCOPY (EGD);  Surgeon: Rogene Houston, MD;  [facility]: [facility];  Service: Endoscopy;  Laterality: N/A;  125-moved to 12:00 Ann notified pt  . Esophagogastroduodenoscopy N/A 12/13/2012    Procedure: ESOPHAGOGASTRODUODENOSCOPY (EGD);  Surgeon: Rogene Houston, MD;  [facility];  Service: Endoscopy;  Laterality: N/A;  155-moved to 220 Ann to notify pt  . Esophageal banding N/A 12/13/2012    Procedure: ESOPHAGEAL BANDING;  Surgeon: Rogene Houston, MD;  [facility]: [facility];  Service: Endoscopy;  Laterality: N/A;     No Known Allergies    Family History  Problem Relation Age of Onset  . Cancer Other     Colon Cancer, Prostate Cancer-Other Blood Relative  . Hyperlipidemia Other     Other Blood Relative  . Stroke Other     Parent, Other Blood Relative  . Diabetes Other     Parent, Other Blood Relative  . Hypertension Other     Parent  . Prostate cancer Maternal Aunt   . Dementia Maternal Aunt   . Dementia Sister    . Dementia Sister   . Alzheimer's disease Sister   . Liver disease Brother      Social History Cheryl Velasquez reports that she has never smoked. She has never used smokeless tobacco. Cheryl Velasquez reports that she does not drink alcohol.   Review of Systems CONSTITUTIONAL: No weight loss, fever, chills, weakness or fatigue.  HEENT: Eyes: No visual loss, blurred vision, double vision or yellow sclerae.No hearing loss, sneezing, congestion, runny nose or sore throat.  SKIN: No rash or itching.  CARDIOVASCULAR: per HPI RESPIRATORY: No shortness of breath, cough or sputum.  GASTROINTESTINAL: No anorexia, nausea, vomiting or diarrhea. No abdominal pain or blood.  GENITOURINARY: No burning on urination, no polyuria NEUROLOGICAL: No headache, dizziness, syncope, paralysis, ataxia, numbness or tingling in the extremities. No change in bowel or bladder control.  MUSCULOSKELETAL: No muscle, back pain, joint pain or stiffness.  LYMPHATICS: No enlarged nodes. No history of splenectomy.  PSYCHIATRIC: No history of depression or anxiety.  ENDOCRINOLOGIC: No reports of sweating, cold or heat intolerance. No polyuria or polydipsia.  Marland Kitchen   Physical Examination p 75 bp 148/86 Wt 244 lbs BMI 45 Gen: resting comfortably, no acute distress HEENT: no scleral icterus, pupils equal round and reactive, no palptable cervical adenopathy,  CV: RRR, no m/r/g, no JVD, no carotid brutis Resp: expiratory wheezing GI: abdomen is soft, non-tender, non-distended, normal bowel sounds, no hepatosplenomegaly MSK: extremities are warm, no edema.  Skin: warm, no rash Neuro:  no focal deficits Psych: appropriate affect   Diagnostic Studies 10/30/13 Echo Study Conclusions  - Left ventricle: The cavity size was normal. Wall thickness was normal. Systolic function was normal. The estimated ejection fraction was in the range of 55% to 60%. Wall motion was normal; there were no regional wall motion abnormalities. Features  are consistent with a pseudonormal left ventricular filling pattern, with concomitant abnormal relaxation and increased filling pressure (grade 2 diastolic dysfunction). - Aortic valve: Mildly to moderately calcified annulus. Trileaflet; mildly calcified leaflets. There was no significant regurgitation. - Mitral valve: Moderately calcified annulus. There was mild regurgitation. - Left atrium: The atrium was moderately to severely dilated. - Right ventricle: The cavity size was mildly dilated. - Right atrium: The atrium was moderately dilated. - Tricuspid valve: There was trivial regurgitation. - Pulmonary arteries: Systolic pressure could not  be accurately estimated. - Inferior vena cava: Not visualized. Unable to estimate CVP. - Pericardium, extracardiac: There was no pericardial effusion.  Impressions:  - Overall normal LV wall thickness and chamber size with LVEF 55-60%. Grade 2 diastolic dysfunction. Moderate to severe left atrial enlargement. Moderately calcified mitral annulus with mild mitral regurgitation. Mildly sclerotic aortic valve. Mildly dilated RV with normal contraction. Moderate right atrial enlargement. Trivial tricuspid regurgitation, unable to assess PASP or CVP.       Assessment and Plan  1. SOB/DOE  - likely related to her underlying diastolic dysfunction, continue diuretic - has some wheezing on exam, will check PFTs  2. HTN  - continue current meds, has been borderline last few visit - follow trend, may need intensification of therapy at next visit  F/u 6 months       Arnoldo Lenis, M.D., F.A.C.C.

## 2013-11-06 DIAGNOSIS — R0609 Other forms of dyspnea: Secondary | ICD-10-CM | POA: Diagnosis not present

## 2013-11-06 DIAGNOSIS — R0989 Other specified symptoms and signs involving the circulatory and respiratory systems: Secondary | ICD-10-CM | POA: Diagnosis not present

## 2013-11-06 DIAGNOSIS — R0602 Shortness of breath: Secondary | ICD-10-CM | POA: Diagnosis not present

## 2013-11-06 LAB — PULMONARY FUNCTION TEST

## 2013-11-15 ENCOUNTER — Telehealth: Payer: Self-pay | Admitting: *Deleted

## 2013-11-15 NOTE — Telephone Encounter (Signed)
Message copied by Laurine Blazer on Fri Nov 15, 2013  8:24 AM ------      Message from: [place] F      Created: Thu Nov 14, 2013 12:40 PM       Please let patient know that her lung tests were abnormal, and suggest she does have some moderate obstructive airway disease (either asthma or COPD). Please forward results to her primary, she may need to start some inhalers to help with her SOB. This finding would be best managed by her pcp            Zandra Abts MD ------

## 2013-11-15 NOTE — Telephone Encounter (Signed)
Notes Recorded by Laurine Blazer, LPN on 35/10/4088 at 5:02 AM Left message to return call.

## 2013-11-18 NOTE — Telephone Encounter (Signed)
Notes Recorded by Laurine Blazer, LPN on 30/0/9794 at 9:97 PM Patient notified. Will forward to PMD.

## 2013-11-20 DIAGNOSIS — Z23 Encounter for immunization: Secondary | ICD-10-CM | POA: Diagnosis not present

## 2013-11-26 ENCOUNTER — Encounter (INDEPENDENT_AMBULATORY_CARE_PROVIDER_SITE_OTHER): Payer: Self-pay | Admitting: Internal Medicine

## 2013-11-26 ENCOUNTER — Encounter (INDEPENDENT_AMBULATORY_CARE_PROVIDER_SITE_OTHER): Payer: Self-pay | Admitting: *Deleted

## 2013-11-26 ENCOUNTER — Ambulatory Visit (INDEPENDENT_AMBULATORY_CARE_PROVIDER_SITE_OTHER): Payer: Medicare Other | Admitting: Internal Medicine

## 2013-11-26 VITALS — BP 140/66 | HR 60 | Temp 98.0°F | Ht 62.0 in | Wt 243.6 lb

## 2013-11-26 DIAGNOSIS — K7460 Unspecified cirrhosis of liver: Secondary | ICD-10-CM | POA: Diagnosis not present

## 2013-11-26 NOTE — Patient Instructions (Signed)
OV 6 months.

## 2013-11-26 NOTE — Progress Notes (Addendum)
Subjective:    Patient ID: Cheryl Velasquez, female    DOB: [birth year], 73 y.o.   MRN: [id number]  HPI  73 yr old female with hx of biopsy proven stage 4 cirrhosis secondary to NAFLD. Liver biopsy in 2012. Her Mitochondrial antibody was positive at low titer but biopsy did not show typical changes of PBC. She has undergone esophageal variceal banding twice. First session on 09/20/2012 and second session was on 12/13/2012. Done primary prophylaxis. Her last [place] was this year. I will get that report. She tells me she is doing good. She says she is just getting over bronchitis.  She says she is not exercising but she is planning. Her appetite is good. Her weight has remained the same. She has a BM 2-3 times a day. No confusion.  She occasionally has some edema to lower extremities and recently started on Lasix 24m.  06/03/2013 [place]: Cirrhosis, No focal hepatic lesion identified. Cholelithiasis without associated findings to suggest acute cholecystitis.     10/02/2013 H and H 9.8 and 31.0, Platelet ct 91, AFP 9.7   10/30/2013 Echocardiogram: Dr. JDeretha Emory - Overall normal LV wall thickness and chamber size with LVEF 55-60%. Grade 2 diastolic dysfunction. Moderate to severe left atrial enlargement. Moderately calcified mitral annulus with mild mitral regurgitation. Mildly sclerotic aortic valve. Mildly dilated RV with normal contraction. Moderate right atrial enlargement. Trivial tricuspid regurgitation, unable to assess PASP or CVP.       Review of Systems Past Medical History  Diagnosis Date  . History of breast cancer   . History of chicken pox   . Arthritis   . Diabetes mellitus   . Hyperlipidemia   . Hypertension   . Thyroid disease   . Cirrhosis     Past Surgical History  Procedure Laterality Date  . Hernia repair  2004  . Abdominal hysterectomy  1987  . Breast biopsy  2005  . Esophagogastroduodenoscopy N/A 09/20/2012    Procedure:  ESOPHAGOGASTRODUODENOSCOPY (EGD);  Surgeon: NRogene Houston MD;  [facility];  Service: Endoscopy;  Laterality: N/A;  125-moved to 12:00 Ann notified pt  . Esophagogastroduodenoscopy N/A 12/13/2012    Procedure: ESOPHAGOGASTRODUODENOSCOPY (EGD);  Surgeon: NRogene Houston MD;  [facility];  Service: Endoscopy;  Laterality: N/A;  155-moved to 220 Ann to notify pt  . Esophageal banding N/A 12/13/2012    Procedure: ESOPHAGEAL BANDING;  Surgeon: NRogene Houston MD;  [facility];  Service: Endoscopy;  Laterality: N/A;    No Known Allergies  Current Outpatient Prescriptions on File Prior to Visit  Medication Sig Dispense Refill  . albuterol (PROAIR HFA) 108 (90 BASE) MCG/ACT inhaler Inhale 2 puffs into the lungs every 6 (six) hours as needed for wheezing or shortness of breath.      . ALPRAZolam (XANAX) 0.5 MG tablet Take 0.5 mg by mouth 2 (two) times daily as needed for anxiety.       . Calcium Carbonate-Vitamin D (CALTRATE 600+D) 600-400 MG-UNIT per tablet Take 1 tablet by mouth daily.        . captopril (CAPOTEN) 25 MG tablet Take 25 mg by mouth 2 (two) times daily.        . furosemide (LASIX) 20 MG tablet Take 1 tablet by mouth daily.      .Marland KitchenglipiZIDE (GLUCOTROL XL) 5 MG 24 hr tablet Take 5 mg by mouth daily.        . hydroxypropyl methylcellulose (ISOPTO TEARS) 2.5 %  ophthalmic solution Place 1 drop into both eyes daily as needed (Dry Eyes).      . metFORMIN (GLUCOPHAGE-XR) 500 MG 24 hr tablet Take 1,000 mg by mouth 2 (two) times daily.       . Multiple Vitamins-Minerals (CENTRUM SILVER PO) Take 1 tablet by mouth daily.        . nadolol (CORGARD) 40 MG tablet TAKE 1 TABLET BY MOUTH EVERY DAY  30 tablet  5  . oxymetazoline (AFRIN) 0.05 % nasal spray Place 1 spray into both nostrils 2 (two) times daily as needed for congestion.      . pantoprazole (PROTONIX) 40 MG tablet TAKE 1 TABLET BY MOUTH EVERY DAY  30 tablet  5  . simvastatin (ZOCOR) 40 MG tablet Take  40 mg by mouth daily.        . vitamin C (ASCORBIC ACID) 500 MG tablet Take 500 mg by mouth daily.      . vitamin E 400 UNIT capsule Take 400 Units by mouth daily.       No current facility-administered medications on file prior to visit.        Objective:   Physical Exam  Filed Vitals:   11/26/13 1414  BP: 140/66  Pulse: 60  Temp: 98 F (36.7 C)  Height: 5' 2"  (1.575 m)  Weight: 243 lb 9.6 oz (110.496 kg)   Alert and oriented. Skin warm and dry. Oral mucosa is moist.   . Sclera anicteric, conjunctivae is pink. Thyroid not enlarged. No cervical lymphadenopathy. Lungs clear. Heart regular rate and rhythm.  Abdomen is soft. Bowel sounds are positive. No hepatomegaly. No abdominal masses felt. No tenderness. Abdomen obese No edema to lower extremities. .        Assessment & Plan:  Biopsy proven stage 4 cirrhosis. She seems to be doing well. Will need labs and US abdomen this visit. Labs AFP, PT/INR, CBC, Hepatic profile, Hep C quaint. OV 6 months. CBC, Hepatic profile, AFP, US abdomen, PT/INR. OV in 6 months.

## 2013-11-27 LAB — HEPATIC FUNCTION PANEL
ALBUMIN: 3.5 g/dL (ref 3.5–5.2)
ALK PHOS: 67 U/L (ref 39–117)
ALT: 20 U/L (ref 0–35)
AST: 28 U/L (ref 0–37)
BILIRUBIN INDIRECT: 0.7 mg/dL (ref 0.2–1.2)
BILIRUBIN TOTAL: 1 mg/dL (ref 0.2–1.2)
Bilirubin, Direct: 0.3 mg/dL (ref 0.0–0.3)
Total Protein: 6.6 g/dL (ref 6.0–8.3)

## 2013-11-27 LAB — PROTIME-INR
INR: 1.15 (ref <1.50)
Prothrombin Time: 14.7 seconds (ref 11.6–15.2)

## 2013-11-27 LAB — CBC WITH DIFFERENTIAL/PLATELET
Basophils Absolute: 0 10*3/uL (ref 0.0–0.1)
Basophils Relative: 1 % (ref 0–1)
EOS ABS: 0.1 10*3/uL (ref 0.0–0.7)
EOS PCT: 2 % (ref 0–5)
HCT: 37.5 % (ref 36.0–46.0)
HEMOGLOBIN: 12.4 g/dL (ref 12.0–15.0)
LYMPHS PCT: 30 % (ref 12–46)
Lymphs Abs: 0.8 10*3/uL (ref 0.7–4.0)
MCH: 29 pg (ref 26.0–34.0)
MCHC: 33.1 g/dL (ref 30.0–36.0)
MCV: 87.8 fL (ref 78.0–100.0)
MONOS PCT: 10 % (ref 3–12)
Monocytes Absolute: 0.3 10*3/uL (ref 0.1–1.0)
Neutro Abs: 1.5 10*3/uL — ABNORMAL LOW (ref 1.7–7.7)
Neutrophils Relative %: 57 % (ref 43–77)
PLATELETS: 70 10*3/uL — AB (ref 150–400)
RBC: 4.27 MIL/uL (ref 3.87–5.11)
RDW: 22.7 % — ABNORMAL HIGH (ref 11.5–15.5)
WBC: 2.6 10*3/uL — AB (ref 4.0–10.5)

## 2013-11-27 LAB — AFP TUMOR MARKER: AFP-Tumor Marker: 14.6 ng/mL — ABNORMAL HIGH (ref <6.1)

## 2013-11-27 LAB — HEPATITIS C RNA QUANTITATIVE: HCV QUANT: NOT DETECTED [IU]/mL (ref <15)

## 2013-12-02 ENCOUNTER — Ambulatory Visit (HOSPITAL_COMMUNITY): Payer: Medicare Other

## 2013-12-06 DIAGNOSIS — R160 Hepatomegaly, not elsewhere classified: Secondary | ICD-10-CM | POA: Diagnosis not present

## 2013-12-06 DIAGNOSIS — K7460 Unspecified cirrhosis of liver: Secondary | ICD-10-CM | POA: Diagnosis not present

## 2013-12-06 DIAGNOSIS — K8020 Calculus of gallbladder without cholecystitis without obstruction: Secondary | ICD-10-CM | POA: Diagnosis not present

## 2013-12-31 DIAGNOSIS — D72819 Decreased white blood cell count, unspecified: Secondary | ICD-10-CM | POA: Diagnosis not present

## 2013-12-31 DIAGNOSIS — D696 Thrombocytopenia, unspecified: Secondary | ICD-10-CM | POA: Diagnosis not present

## 2013-12-31 DIAGNOSIS — E538 Deficiency of other specified B group vitamins: Secondary | ICD-10-CM | POA: Diagnosis not present

## 2013-12-31 DIAGNOSIS — J0101 Acute recurrent maxillary sinusitis: Secondary | ICD-10-CM | POA: Diagnosis not present

## 2013-12-31 DIAGNOSIS — D509 Iron deficiency anemia, unspecified: Secondary | ICD-10-CM | POA: Diagnosis not present

## 2014-01-28 DIAGNOSIS — E039 Hypothyroidism, unspecified: Secondary | ICD-10-CM | POA: Diagnosis not present

## 2014-01-28 DIAGNOSIS — E1165 Type 2 diabetes mellitus with hyperglycemia: Secondary | ICD-10-CM | POA: Diagnosis not present

## 2014-01-28 DIAGNOSIS — I10 Essential (primary) hypertension: Secondary | ICD-10-CM | POA: Diagnosis not present

## 2014-01-28 DIAGNOSIS — E782 Mixed hyperlipidemia: Secondary | ICD-10-CM | POA: Diagnosis not present

## 2014-02-03 DIAGNOSIS — K219 Gastro-esophageal reflux disease without esophagitis: Secondary | ICD-10-CM | POA: Diagnosis not present

## 2014-02-03 DIAGNOSIS — E782 Mixed hyperlipidemia: Secondary | ICD-10-CM | POA: Diagnosis not present

## 2014-02-03 DIAGNOSIS — D649 Anemia, unspecified: Secondary | ICD-10-CM | POA: Diagnosis not present

## 2014-02-03 DIAGNOSIS — F419 Anxiety disorder, unspecified: Secondary | ICD-10-CM | POA: Diagnosis not present

## 2014-02-03 DIAGNOSIS — I5030 Unspecified diastolic (congestive) heart failure: Secondary | ICD-10-CM | POA: Diagnosis not present

## 2014-02-03 DIAGNOSIS — I10 Essential (primary) hypertension: Secondary | ICD-10-CM | POA: Diagnosis not present

## 2014-02-03 DIAGNOSIS — E039 Hypothyroidism, unspecified: Secondary | ICD-10-CM | POA: Diagnosis not present

## 2014-02-03 DIAGNOSIS — E1165 Type 2 diabetes mellitus with hyperglycemia: Secondary | ICD-10-CM | POA: Diagnosis not present

## 2014-02-10 DIAGNOSIS — E119 Type 2 diabetes mellitus without complications: Secondary | ICD-10-CM | POA: Diagnosis not present

## 2014-02-12 ENCOUNTER — Encounter (INDEPENDENT_AMBULATORY_CARE_PROVIDER_SITE_OTHER): Payer: Self-pay

## 2014-02-27 ENCOUNTER — Ambulatory Visit: Payer: Medicare Other | Admitting: Endocrinology

## 2014-02-27 ENCOUNTER — Encounter: Payer: Self-pay | Admitting: Endocrinology

## 2014-02-27 ENCOUNTER — Ambulatory Visit (INDEPENDENT_AMBULATORY_CARE_PROVIDER_SITE_OTHER): Payer: Medicare Other | Admitting: Endocrinology

## 2014-02-27 VITALS — BP 130/84 | HR 85 | Temp 98.6°F | Ht 62.0 in | Wt 242.0 lb

## 2014-02-27 DIAGNOSIS — E042 Nontoxic multinodular goiter: Secondary | ICD-10-CM

## 2014-02-27 LAB — T4, FREE: Free T4: 1.11 ng/dL (ref 0.60–1.60)

## 2014-02-27 LAB — TSH: TSH: 2.06 u[IU]/mL (ref 0.35–4.50)

## 2014-02-27 NOTE — Progress Notes (Signed)
Subjective:    Patient ID: Cheryl Velasquez, female    DOB: 03/06/1940, 74 y.o.   MRN: [id number]  HPI Pt had i-131 rx for hyperthyroidism due to multinodular goiter, in march of 2013.  TSH normalized, and has stayed normal.  She presented in 2013 with a thyroid nodule, cold on scan.  bx was benign, prior to the i-131 rx.  pt states she feels well in general.  She does not notice the goiter. Past Medical History  Diagnosis Date  . History of breast cancer   . History of chicken pox   . Arthritis   . Diabetes mellitus   . Hyperlipidemia   . Hypertension   . Thyroid disease   . Cirrhosis     Past Surgical History  Procedure Laterality Date  . Hernia repair  2004  . Abdominal hysterectomy  1987  . Breast biopsy  2005  . Esophagogastroduodenoscopy N/A 09/20/2012    Procedure: ESOPHAGOGASTRODUODENOSCOPY (EGD);  Surgeon: Rogene Houston, MD;  [facility]: [facility];  Service: Endoscopy;  Laterality: N/A;  125-moved to 12:00 Ann notified pt  . Esophagogastroduodenoscopy N/A 12/13/2012    Procedure: ESOPHAGOGASTRODUODENOSCOPY (EGD);  Surgeon: Rogene Houston, MD;  Location: [facility];  Service: Endoscopy;  Laterality: N/A;  155-moved to 220 Ann to notify pt  . Esophageal banding N/A 12/13/2012    Procedure: ESOPHAGEAL BANDING;  Surgeon: Rogene Houston, MD;  [facility]: [facility];  Service: Endoscopy;  Laterality: N/A;    History   Social History  . Marital Status: Single    Spouse Name: N/A    Number of Children: 0  . Years of Education: 10   Occupational History  . Textiles    Social History Main Topics  . Smoking status: Never Smoker   . Smokeless tobacco: Never Used  . Alcohol Use: No  . Drug Use: No  . Sexual Activity: Not on file   Other Topics Concern  . Not on file   Social History Narrative   Regular Exercise-no    Current Outpatient Prescriptions on File Prior to Visit  Medication Sig Dispense Refill  . albuterol (PROAIR HFA) 108 (90 BASE) MCG/ACT  inhaler Inhale 2 puffs into the lungs every 6 (six) hours as needed for wheezing or shortness of breath.    . ALPRAZolam (XANAX) 0.5 MG tablet Take 0.5 mg by mouth 2 (two) times daily as needed for anxiety.     . Calcium Carbonate-Vitamin D (CALTRATE 600+D) 600-400 MG-UNIT per tablet Take 1 tablet by mouth daily.      . captopril (CAPOTEN) 25 MG tablet Take 25 mg by mouth 2 (two) times daily.      . furosemide (LASIX) 20 MG tablet Take 1 tablet by mouth daily.    Marland Kitchen glipiZIDE (GLUCOTROL XL) 5 MG 24 hr tablet Take 5 mg by mouth daily.      . hydroxypropyl methylcellulose (ISOPTO TEARS) 2.5 % ophthalmic solution Place 1 drop into both eyes daily as needed (Dry Eyes).    . metFORMIN (GLUCOPHAGE-XR) 500 MG 24 hr tablet Take 1,000 mg by mouth 2 (two) times daily.     . Multiple Vitamins-Minerals (CENTRUM SILVER PO) Take 1 tablet by mouth daily.      . nadolol (CORGARD) 40 MG tablet TAKE 1 TABLET BY MOUTH EVERY DAY 30 tablet 5  . oxymetazoline (AFRIN) 0.05 % nasal spray Place 1 spray into both nostrils 2 (two) times daily as needed for congestion.    Marland Kitchen  pantoprazole (PROTONIX) 40 MG tablet TAKE 1 TABLET BY MOUTH EVERY DAY 30 tablet 5  . simvastatin (ZOCOR) 40 MG tablet Take 40 mg by mouth daily.      . vitamin C (ASCORBIC ACID) 500 MG tablet Take 500 mg by mouth daily.    . vitamin E 400 UNIT capsule Take 400 Units by mouth daily.     No current facility-administered medications on file prior to visit.    No Known Allergies  Family History  Problem Relation Age of Onset  . Cancer Other     Colon Cancer, Prostate Cancer-Other Blood Relative  . Hyperlipidemia Other     Other Blood Relative  . Stroke Other     Parent, Other Blood Relative  . Diabetes Other     Parent, Other Blood Relative  . Hypertension Other     Parent  . Prostate cancer Maternal Aunt   . Dementia Maternal Aunt   . Dementia Sister   . Dementia Sister   . Alzheimer's disease Sister   . Liver disease Brother     BP  130/84 mmHg  Pulse 85  Temp(Src) 98.6 F (37 C) (Oral)  Ht 5' 2"  (1.575 m)  Wt 242 lb (109.77 kg)  BMI 44.25 kg/m2  SpO2 98%    Review of Systems Denies tremor    Objective:   Physical Exam VITAL SIGNS:  See vs page GENERAL: no distress NECK: There is no palpable thyroid enlargement.  No thyroid nodule is palpable.  No palpable lymphadenopathy at the anterior neck.   Lab Results  Component Value Date   TSH 2.06 02/27/2014       Assessment & Plan:  Hyperthyroidism, resolved with I-131 rx, but she is at risk for recurrence.  Patient is advised the following: Patient Instructions  A thyroid blood test is requested for you today.  We'll contact you with results. You can skip the ultrasound this year Please come back for a follow-up appointment in 1 year. most of the time, a "lumpy thyroid" will eventually become overactive again.  this is usually a slow process, happening over the span of many years.

## 2014-02-27 NOTE — Patient Instructions (Addendum)
A thyroid blood test is requested for you today.  We'll contact you with results. You can skip the ultrasound this year Please come back for a follow-up appointment in 1 year. most of the time, a "lumpy thyroid" will eventually become overactive again.  this is usually a slow process, happening over the span of many years.

## 2014-03-17 DIAGNOSIS — Z1231 Encounter for screening mammogram for malignant neoplasm of breast: Secondary | ICD-10-CM | POA: Diagnosis not present

## 2014-03-27 DIAGNOSIS — E538 Deficiency of other specified B group vitamins: Secondary | ICD-10-CM | POA: Diagnosis not present

## 2014-03-27 DIAGNOSIS — D500 Iron deficiency anemia secondary to blood loss (chronic): Secondary | ICD-10-CM | POA: Diagnosis not present

## 2014-04-08 ENCOUNTER — Telehealth (INDEPENDENT_AMBULATORY_CARE_PROVIDER_SITE_OTHER): Payer: Self-pay | Admitting: Internal Medicine

## 2014-04-08 DIAGNOSIS — D508 Other iron deficiency anemias: Secondary | ICD-10-CM | POA: Diagnosis not present

## 2014-04-08 DIAGNOSIS — D696 Thrombocytopenia, unspecified: Secondary | ICD-10-CM | POA: Diagnosis not present

## 2014-04-08 DIAGNOSIS — D72819 Decreased white blood cell count, unspecified: Secondary | ICD-10-CM | POA: Diagnosis not present

## 2014-04-08 DIAGNOSIS — K7469 Other cirrhosis of liver: Secondary | ICD-10-CM | POA: Diagnosis not present

## 2014-04-08 NOTE — Telephone Encounter (Signed)
12/06/2013 US abdomen: surveillance for cirrhosis. Report:  Liver is enlarged with nodular contour an increased echogenicity compatible with chronic liver disease and /or cirrhosis. No focal lesion identified on sonographic survey. Cholelithiasis without evidence of acute cholecystitis.Cheryl Velasquez

## 2014-04-18 ENCOUNTER — Telehealth (INDEPENDENT_AMBULATORY_CARE_PROVIDER_SITE_OTHER): Payer: Self-pay | Admitting: *Deleted

## 2014-04-18 DIAGNOSIS — E079 Disorder of thyroid, unspecified: Secondary | ICD-10-CM

## 2014-04-18 DIAGNOSIS — E785 Hyperlipidemia, unspecified: Secondary | ICD-10-CM

## 2014-04-18 DIAGNOSIS — K7469 Other cirrhosis of liver: Secondary | ICD-10-CM

## 2014-04-18 NOTE — Telephone Encounter (Signed)
Cheryl, Velasquez was last seen on 11/26/13 by Cheryl Castle, NP. She wanted her to have labs and an office apt in 6 months. I have scheduled the apt for 06/02/14. This is a list of labs needed in 6 month from [name] on 11/27/14: Labs AFP, PT/INR, CBC, Hepatic profile, Hep C quaint. OV 6 months. CBC, Hepatic profile, AFP, US abdomen, PT/INR. OV in 6 months.

## 2014-04-21 NOTE — Telephone Encounter (Signed)
Labs have been noted.

## 2014-05-05 ENCOUNTER — Ambulatory Visit (INDEPENDENT_AMBULATORY_CARE_PROVIDER_SITE_OTHER): Payer: Medicare Other | Admitting: Cardiology

## 2014-05-05 ENCOUNTER — Encounter: Payer: Self-pay | Admitting: Cardiology

## 2014-05-05 ENCOUNTER — Encounter: Payer: Self-pay | Admitting: *Deleted

## 2014-05-05 VITALS — BP 147/75 | HR 70 | Ht 62.0 in | Wt 239.0 lb

## 2014-05-05 DIAGNOSIS — I5032 Chronic diastolic (congestive) heart failure: Secondary | ICD-10-CM

## 2014-05-05 DIAGNOSIS — I10 Essential (primary) hypertension: Secondary | ICD-10-CM | POA: Diagnosis not present

## 2014-05-05 NOTE — Patient Instructions (Signed)
Continue all current medications. Your physician wants you to follow up in: 6 months.  You will receive a reminder letter in the mail one-two months in advance.  If you don't receive a letter, please call our office to schedule the follow up appointment   

## 2014-05-05 NOTE — Progress Notes (Signed)
Clinical Summary Cheryl Velasquez is a 74 y.o.female seen today for follow up of the following medical problems.   1. Chronic diastolic heart failure.  - echo 10/30/13 LVEF 55-60%, grade II diastolic dysfunction - denies any significant SOB since last visit. No LE edema, no orthopnea, no PND.  - she is compliant with her lasix, limiting salt intake  2. Hx of breast CA  - hx of breast CA treated with radiation, arimidex   3. Hypothyroidism  - compliant with synthroid, followed by pcp  4. Cirrhosis  - followed by GI  - reports ASA has been stopped by GI doctor  5. Chronic anemia  - received regular injection of IV iron  6. HTN - does not check at home - compliant with meds  7. COPD - no hx of tobacco use, reports heavy second hand smoke exposure - abnormal PFTs 10/2013 - followed by pcp, currently on prn albuterol   Past Medical History  Diagnosis Date  . History of breast cancer   . History of chicken pox   . Arthritis   . Diabetes mellitus   . Hyperlipidemia   . Hypertension   . Thyroid disease   . Cirrhosis      No Known Allergies   Current Outpatient Prescriptions  Medication Sig Dispense Refill  . albuterol (PROAIR HFA) 108 (90 BASE) MCG/ACT inhaler Inhale 2 puffs into the lungs every 6 (six) hours as needed for wheezing or shortness of breath.    . ALPRAZolam (XANAX) 0.5 MG tablet Take 0.5 mg by mouth 2 (two) times daily as needed for anxiety.     . Calcium Carbonate-Vitamin D (CALTRATE 600+D) 600-400 MG-UNIT per tablet Take 1 tablet by mouth daily.      . captopril (CAPOTEN) 25 MG tablet Take 25 mg by mouth 2 (two) times daily.      . furosemide (LASIX) 20 MG tablet Take 1 tablet by mouth daily.    Marland Kitchen glipiZIDE (GLUCOTROL XL) 5 MG 24 hr tablet Take 5 mg by mouth daily.      . hydroxypropyl methylcellulose (ISOPTO TEARS) 2.5 % ophthalmic solution Place 1 drop into both eyes daily as needed (Dry Eyes).    . metFORMIN (GLUCOPHAGE-XR) 500 MG 24 hr tablet  Take 1,000 mg by mouth 2 (two) times daily.     . Multiple Vitamins-Minerals (CENTRUM SILVER PO) Take 1 tablet by mouth daily.      . nadolol (CORGARD) 40 MG tablet TAKE 1 TABLET BY MOUTH EVERY DAY 30 tablet 5  . oxymetazoline (AFRIN) 0.05 % nasal spray Place 1 spray into both nostrils 2 (two) times daily as needed for congestion.    . pantoprazole (PROTONIX) 40 MG tablet TAKE 1 TABLET BY MOUTH EVERY DAY 30 tablet 5  . simvastatin (ZOCOR) 40 MG tablet Take 40 mg by mouth daily.      . vitamin C (ASCORBIC ACID) 500 MG tablet Take 500 mg by mouth daily.    . vitamin E 400 UNIT capsule Take 400 Units by mouth daily.     No current facility-administered medications for this visit.     Past Surgical History  Procedure Laterality Date  . Hernia repair  2004  . Abdominal hysterectomy  1987  . Breast biopsy  2005  . Esophagogastroduodenoscopy N/A 09/20/2012    Procedure: ESOPHAGOGASTRODUODENOSCOPY (EGD);  Surgeon: Rogene Houston, MD;  [facility];  Service: Endoscopy;  Laterality: N/A;  125-moved to 12:00 Ann notified pt  .  Esophagogastroduodenoscopy N/A 12/13/2012    Procedure: ESOPHAGOGASTRODUODENOSCOPY (EGD);  Surgeon: Rogene Houston, MD;  [facility]: [facility];  Service: Endoscopy;  Laterality: N/A;  155-moved to 220 Ann to notify pt  . Esophageal banding N/A 12/13/2012    Procedure: ESOPHAGEAL BANDING;  Surgeon: Rogene Houston, MD;  [facility]: [facility];  Service: Endoscopy;  Laterality: N/A;     No Known Allergies    Family History  Problem Relation Age of Onset  . Cancer Other     Colon Cancer, Prostate Cancer-Other Blood Relative  . Hyperlipidemia Other     Other Blood Relative  . Stroke Other     Parent, Other Blood Relative  . Diabetes Other     Parent, Other Blood Relative  . Hypertension Other     Parent  . Prostate cancer Maternal Aunt   . Dementia Maternal Aunt   . Dementia Sister   . Dementia Sister   . Alzheimer's disease Sister   . Liver  disease Brother      Social History Cheryl Velasquez reports that she has never smoked. She has never used smokeless tobacco. Cheryl Velasquez reports that she does not drink alcohol.   Review of Systems CONSTITUTIONAL: No weight loss, fever, chills, weakness or fatigue.  HEENT: Eyes: No visual loss, blurred vision, double vision or yellow sclerae.No hearing loss, sneezing, congestion, runny nose or sore throat.  SKIN: No rash or itching.  CARDIOVASCULAR: per HPI RESPIRATORY: No shortness of breath, cough or sputum.  GASTROINTESTINAL: No anorexia, nausea, vomiting or diarrhea. No abdominal pain or blood.  GENITOURINARY: No burning on urination, no polyuria NEUROLOGICAL: No headache, dizziness, syncope, paralysis, ataxia, numbness or tingling in the extremities. No change in bowel or bladder control.  MUSCULOSKELETAL: No muscle, back pain, joint pain or stiffness.  LYMPHATICS: No enlarged nodes. No history of splenectomy.  PSYCHIATRIC: No history of depression or anxiety.  ENDOCRINOLOGIC: No reports of sweating, cold or heat intolerance. No polyuria or polydipsia.  Marland Kitchen   Physical Examination p 70 bp 130/80 Wt 239 lbs BMI 44 Gen: resting comfortably, no acute distress HEENT: no scleral icterus, pupils equal round and reactive, no palptable cervical adenopathy,  CV: RRR, no m/r/g, no JVD, no carotid bruits Resp: Clear to auscultation bilaterally GI: abdomen is soft, non-tender, non-distended, normal bowel sounds, no hepatosplenomegaly MSK: extremities are warm, no edema.  Skin: warm, no rash Neuro:  no focal deficits Psych: appropriate affect   Diagnostic Studies 10/30/13 Echo Study Conclusions  - Left ventricle: The cavity size was normal. Wall thickness was normal. Systolic function was normal. The estimated ejection fraction was in the range of 55% to 60%. Wall motion was normal; there were no regional wall motion abnormalities. Features are consistent with a pseudonormal left  ventricular filling pattern, with concomitant abnormal relaxation and increased filling pressure (grade 2 diastolic dysfunction). - Aortic valve: Mildly to moderately calcified annulus. Trileaflet; mildly calcified leaflets. There was no significant regurgitation. - Mitral valve: Moderately calcified annulus. There was mild regurgitation. - Left atrium: The atrium was moderately to severely dilated. - Right ventricle: The cavity size was mildly dilated. - Right atrium: The atrium was moderately dilated. - Tricuspid valve: There was trivial regurgitation. - Pulmonary arteries: Systolic pressure could not be accurately estimated. - Inferior vena cava: Not visualized. Unable to estimate CVP. - Pericardium, extracardiac: There was no pericardial effusion.  Impressions:  - Overall normal LV wall thickness and chamber size with LVEF 55-60%. Grade 2 diastolic dysfunction. Moderate to  severe left atrial enlargement. Moderately calcified mitral annulus with mild mitral regurgitation. Mildly sclerotic aortic valve. Mildly dilated RV with normal contraction. Moderate right atrial enlargement. Trivial tricuspid regurgitation, unable to assess PASP or CVP.   10/2013 PFTs: moderate obstruction  Assessment and Plan   1. Chronic diastolic heart failure - appears euvolemic, no current symptoms. Continue current diuretic  2. HTN - at goal, continue current meds    F/u 6 months. Request labs from pcp.    Arnoldo Lenis, M.D

## 2014-05-14 ENCOUNTER — Encounter (INDEPENDENT_AMBULATORY_CARE_PROVIDER_SITE_OTHER): Payer: Self-pay | Admitting: *Deleted

## 2014-05-14 ENCOUNTER — Other Ambulatory Visit (INDEPENDENT_AMBULATORY_CARE_PROVIDER_SITE_OTHER): Payer: Self-pay | Admitting: *Deleted

## 2014-05-14 DIAGNOSIS — E079 Disorder of thyroid, unspecified: Secondary | ICD-10-CM

## 2014-05-14 DIAGNOSIS — E785 Hyperlipidemia, unspecified: Secondary | ICD-10-CM

## 2014-05-14 DIAGNOSIS — K7469 Other cirrhosis of liver: Secondary | ICD-10-CM

## 2014-05-19 DIAGNOSIS — Z85828 Personal history of other malignant neoplasm of skin: Secondary | ICD-10-CM | POA: Diagnosis not present

## 2014-05-19 DIAGNOSIS — D0462 Carcinoma in situ of skin of left upper limb, including shoulder: Secondary | ICD-10-CM | POA: Diagnosis not present

## 2014-05-19 DIAGNOSIS — L570 Actinic keratosis: Secondary | ICD-10-CM | POA: Diagnosis not present

## 2014-05-19 DIAGNOSIS — D485 Neoplasm of uncertain behavior of skin: Secondary | ICD-10-CM | POA: Diagnosis not present

## 2014-05-19 DIAGNOSIS — D0461 Carcinoma in situ of skin of right upper limb, including shoulder: Secondary | ICD-10-CM | POA: Diagnosis not present

## 2014-05-28 DIAGNOSIS — E785 Hyperlipidemia, unspecified: Secondary | ICD-10-CM | POA: Diagnosis not present

## 2014-05-28 DIAGNOSIS — E079 Disorder of thyroid, unspecified: Secondary | ICD-10-CM | POA: Diagnosis not present

## 2014-05-28 DIAGNOSIS — K7460 Unspecified cirrhosis of liver: Secondary | ICD-10-CM | POA: Diagnosis not present

## 2014-05-28 DIAGNOSIS — K7469 Other cirrhosis of liver: Secondary | ICD-10-CM | POA: Diagnosis not present

## 2014-05-29 DIAGNOSIS — L570 Actinic keratosis: Secondary | ICD-10-CM | POA: Diagnosis not present

## 2014-05-29 DIAGNOSIS — C44621 Squamous cell carcinoma of skin of unspecified upper limb, including shoulder: Secondary | ICD-10-CM | POA: Diagnosis not present

## 2014-05-29 LAB — PROTIME-INR
INR: 1.25 (ref <1.50)
PROTHROMBIN TIME: 15.7 s — AB (ref 11.6–15.2)

## 2014-05-29 LAB — CBC
HCT: 41.3 % (ref 36.0–46.0)
Hemoglobin: 14 g/dL (ref 12.0–15.0)
MCH: 32 pg (ref 26.0–34.0)
MCHC: 33.9 g/dL (ref 30.0–36.0)
MCV: 94.3 fL (ref 78.0–100.0)
MPV: 11.2 fL (ref 8.6–12.4)
Platelets: 72 10*3/uL — ABNORMAL LOW (ref 150–400)
RBC: 4.38 MIL/uL (ref 3.87–5.11)
RDW: 15.2 % (ref 11.5–15.5)
WBC: 2.9 10*3/uL — ABNORMAL LOW (ref 4.0–10.5)

## 2014-05-29 LAB — HEPATITIS C RNA QUANTITATIVE: HCV QUANT: NOT DETECTED [IU]/mL (ref <15)

## 2014-05-29 LAB — HEPATIC FUNCTION PANEL
ALK PHOS: 53 U/L (ref 39–117)
ALT: 24 U/L (ref 0–35)
AST: 39 U/L — AB (ref 0–37)
Albumin: 3.6 g/dL (ref 3.5–5.2)
BILIRUBIN DIRECT: 0.3 mg/dL (ref 0.0–0.3)
BILIRUBIN TOTAL: 1.2 mg/dL (ref 0.2–1.2)
Indirect Bilirubin: 0.9 mg/dL (ref 0.2–1.2)
Total Protein: 6.8 g/dL (ref 6.0–8.3)

## 2014-05-29 LAB — AFP TUMOR MARKER: AFP-Tumor Marker: 13.3 ng/mL — ABNORMAL HIGH (ref <6.1)

## 2014-05-30 ENCOUNTER — Other Ambulatory Visit (INDEPENDENT_AMBULATORY_CARE_PROVIDER_SITE_OTHER): Payer: Self-pay | Admitting: Internal Medicine

## 2014-06-02 ENCOUNTER — Encounter (INDEPENDENT_AMBULATORY_CARE_PROVIDER_SITE_OTHER): Payer: Self-pay | Admitting: Internal Medicine

## 2014-06-02 ENCOUNTER — Ambulatory Visit (INDEPENDENT_AMBULATORY_CARE_PROVIDER_SITE_OTHER): Payer: Medicare Other | Admitting: Internal Medicine

## 2014-06-02 ENCOUNTER — Encounter (INDEPENDENT_AMBULATORY_CARE_PROVIDER_SITE_OTHER): Payer: Self-pay | Admitting: *Deleted

## 2014-06-02 VITALS — BP 118/68 | HR 60 | Temp 98.0°F | Ht 62.0 in | Wt 239.4 lb

## 2014-06-02 DIAGNOSIS — K7030 Alcoholic cirrhosis of liver without ascites: Secondary | ICD-10-CM | POA: Diagnosis not present

## 2014-06-02 DIAGNOSIS — K760 Fatty (change of) liver, not elsewhere classified: Secondary | ICD-10-CM

## 2014-06-02 NOTE — Progress Notes (Addendum)
Subjective:    Patient ID: Cheryl Velasquez, female    DOB: [birth year], 74 y.o.   MRN: [id number]  HPI Here today for f/u of her Stage 4 Cirrhosis secondary to NAFLD. She underwent a Liver biopsy in 2012 which revealed Stage 4 Cirrhosis.   [id number] She has undergoe esophageal variceal banding twice. First session on 09/20/2012 and second session was on 12/13/2012. Done primary prophylaxis. She tells me she is doing good.   She sometimes feels tired. Appetite is good. She has lost 4 pounds since her last visit in October.  She does not want to cook.  She snacks frequently. She is not exercising. She denies confusion.    She has a BM 2-3 times a day.  . She occasionally has some edema to lower extremities and recently started on Lasix 14m.   CBC    Component Value Date/Time   WBC 2.9* 05/28/2014 1110   RBC 4.38 05/28/2014 1110   HGB 14.0 05/28/2014 1110   HCT 41.3 05/28/2014 1110   PLT 72* 05/28/2014 1110   MCV 94.3 05/28/2014 1110   MCH 32.0 05/28/2014 1110   MCHC 33.9 05/28/2014 1110   RDW 15.2 05/28/2014 1110   LYMPHSABS 0.8 11/26/2013 1424   MONOABS 0.3 11/26/2013 1424   EOSABS 0.1 11/26/2013 1424   BASOSABS 0.0 11/26/2013 1424   Hepatic Function Panel     Component Value Date/Time   PROT 6.8 05/28/2014 1109   ALBUMIN 3.6 05/28/2014 1109   AST 39* 05/28/2014 1109   ALT 24 05/28/2014 1109   ALKPHOS 53 05/28/2014 1109   BILITOT 1.2 05/28/2014 1109   BILIDIR 0.3 05/28/2014 1109   IBILI 0.9 05/28/2014 1109  05/28/2014 Hep C quaint: undetected. AFP 13.3.      Expand All Collapse All   12/06/2013 [place] surveillance for cirrhosis. Report: Liver is enlarged with nodular contour an increased echogenicity compatible with chronic liver disease and /or cirrhosis. No focal lesion identified on sonographic survey. Cholelithiasis without evidence of acute cholecystitis..      Liver biiopsy 06/26/2010: Stage 4 cirrhosis.  Review of Systems Past Medical History    Diagnosis Date  . History of breast cancer   . History of chicken pox   . Arthritis   . Diabetes mellitus   . Hyperlipidemia   . Hypertension   . Thyroid disease   . Cirrhosis     Past Surgical History  Procedure Laterality Date  . Hernia repair  2004  . Abdominal hysterectomy  1987  . Breast biopsy  2005  . Esophagogastroduodenoscopy N/A 09/20/2012    Procedure: ESOPHAGOGASTRODUODENOSCOPY (EGD);  Surgeon: NRogene Houston MD;  [facility];  Service: Endoscopy;  Laterality: N/A;  125-moved to 12:00 Ann notified pt  . Esophagogastroduodenoscopy N/A 12/13/2012    Procedure: ESOPHAGOGASTRODUODENOSCOPY (EGD);  Surgeon: NRogene Houston MD;  [facility];  Service: Endoscopy;  Laterality: N/A;  155-moved to 220 Ann to notify pt  . Esophageal banding N/A 12/13/2012    Procedure: ESOPHAGEAL BANDING;  Surgeon: NRogene Houston MD;  [facility];  Service: Endoscopy;  Laterality: N/A;    No Known Allergies  Current Outpatient Prescriptions on File Prior to Visit  Medication Sig Dispense Refill  . albuterol (PROAIR HFA) 108 (90 BASE) MCG/ACT inhaler Inhale 2 puffs into the lungs every 6 (six) hours as needed for wheezing or shortness of breath.    . ALPRAZolam (XANAX) 0.5 MG tablet Take 0.5 mg by mouth 2 (  two) times daily as needed for anxiety.     . Biotin 5 MG CAPS Take 1 capsule by mouth once a week.    . Calcium Carbonate-Vitamin D (CALTRATE 600+D) 600-400 MG-UNIT per tablet Take 1 tablet by mouth daily.      . captopril (CAPOTEN) 25 MG tablet Take 25 mg by mouth 2 (two) times daily.      . furosemide (LASIX) 20 MG tablet Take 1 tablet by mouth daily.    Marland Kitchen glipiZIDE (GLUCOTROL XL) 5 MG 24 hr tablet Take 5 mg by mouth daily.      . hydroxypropyl methylcellulose (ISOPTO TEARS) 2.5 % ophthalmic solution Place 1 drop into both eyes daily as needed (Dry Eyes).    . metFORMIN (GLUCOPHAGE-XR) 500 MG 24 hr tablet Take 1,000 mg by mouth 2 (two) times daily.      . Multiple Vitamins-Minerals (CENTRUM SILVER PO) Take 1 tablet by mouth daily.      . nadolol (CORGARD) 40 MG tablet TAKE 1 TABLET BY MOUTH EVERY DAY 30 tablet 5  . nadolol (CORGARD) 40 MG tablet TAKE 1 TABLET BY MOUTH EVERY DAY 30 tablet 5  . pantoprazole (PROTONIX) 40 MG tablet TAKE 1 TABLET BY MOUTH EVERY DAY 30 tablet 5  . simvastatin (ZOCOR) 40 MG tablet Take 40 mg by mouth daily.      . vitamin C (ASCORBIC ACID) 500 MG tablet Take 500 mg by mouth daily.    . vitamin E 400 UNIT capsule Take 400 Units by mouth daily.     No current facility-administered medications on file prior to visit.        Objective:   Physical Exam Blood pressure 118/68, pulse 60, temperature 98 F (36.7 C), height 5' 2"  (1.575 m), weight 239 lb 6.4 oz (108.591 kg). Alert and oriented. Skin warm and dry. Oral mucosa is moist.   . Sclera anicteric, conjunctivae is pink. Thyroid not enlarged. No cervical lymphadenopathy. Lungs clear. Heart regular rate and rhythm.  Abdomen is soft. Bowel sounds are positive. Abdomen is obese.  No abdominal masses felt. No tenderness.  No edema to lower extremities.       Assessment & Plan:  OV in 6 months with a Hepatic function, CBC. [place]

## 2014-06-02 NOTE — Patient Instructions (Signed)
OV in 6 months.

## 2014-06-03 DIAGNOSIS — E1165 Type 2 diabetes mellitus with hyperglycemia: Secondary | ICD-10-CM | POA: Diagnosis not present

## 2014-06-03 DIAGNOSIS — E782 Mixed hyperlipidemia: Secondary | ICD-10-CM | POA: Diagnosis not present

## 2014-06-03 DIAGNOSIS — E039 Hypothyroidism, unspecified: Secondary | ICD-10-CM | POA: Diagnosis not present

## 2014-06-03 DIAGNOSIS — I10 Essential (primary) hypertension: Secondary | ICD-10-CM | POA: Diagnosis not present

## 2014-06-03 DIAGNOSIS — D649 Anemia, unspecified: Secondary | ICD-10-CM | POA: Diagnosis not present

## 2014-06-03 DIAGNOSIS — K219 Gastro-esophageal reflux disease without esophagitis: Secondary | ICD-10-CM | POA: Diagnosis not present

## 2014-06-10 DIAGNOSIS — F419 Anxiety disorder, unspecified: Secondary | ICD-10-CM | POA: Diagnosis not present

## 2014-06-10 DIAGNOSIS — K219 Gastro-esophageal reflux disease without esophagitis: Secondary | ICD-10-CM | POA: Diagnosis not present

## 2014-06-10 DIAGNOSIS — K7460 Unspecified cirrhosis of liver: Secondary | ICD-10-CM | POA: Diagnosis not present

## 2014-06-10 DIAGNOSIS — Z1389 Encounter for screening for other disorder: Secondary | ICD-10-CM | POA: Diagnosis not present

## 2014-06-10 DIAGNOSIS — I10 Essential (primary) hypertension: Secondary | ICD-10-CM | POA: Diagnosis not present

## 2014-06-10 DIAGNOSIS — I5030 Unspecified diastolic (congestive) heart failure: Secondary | ICD-10-CM | POA: Diagnosis not present

## 2014-06-10 DIAGNOSIS — B372 Candidiasis of skin and nail: Secondary | ICD-10-CM | POA: Diagnosis not present

## 2014-06-10 DIAGNOSIS — D696 Thrombocytopenia, unspecified: Secondary | ICD-10-CM | POA: Diagnosis not present

## 2014-06-10 DIAGNOSIS — E782 Mixed hyperlipidemia: Secondary | ICD-10-CM | POA: Diagnosis not present

## 2014-06-10 DIAGNOSIS — E1165 Type 2 diabetes mellitus with hyperglycemia: Secondary | ICD-10-CM | POA: Diagnosis not present

## 2014-06-10 DIAGNOSIS — D649 Anemia, unspecified: Secondary | ICD-10-CM | POA: Diagnosis not present

## 2014-06-10 DIAGNOSIS — E039 Hypothyroidism, unspecified: Secondary | ICD-10-CM | POA: Diagnosis not present

## 2014-06-16 DIAGNOSIS — K7460 Unspecified cirrhosis of liver: Secondary | ICD-10-CM | POA: Diagnosis not present

## 2014-06-16 DIAGNOSIS — K8020 Calculus of gallbladder without cholecystitis without obstruction: Secondary | ICD-10-CM | POA: Diagnosis not present

## 2014-06-16 DIAGNOSIS — K769 Liver disease, unspecified: Secondary | ICD-10-CM | POA: Diagnosis not present

## 2014-06-16 DIAGNOSIS — K8080 Other cholelithiasis without obstruction: Secondary | ICD-10-CM | POA: Diagnosis not present

## 2014-06-16 DIAGNOSIS — K7469 Other cirrhosis of liver: Secondary | ICD-10-CM | POA: Diagnosis not present

## 2014-06-26 ENCOUNTER — Encounter (INDEPENDENT_AMBULATORY_CARE_PROVIDER_SITE_OTHER): Payer: Self-pay | Admitting: *Deleted

## 2014-06-26 ENCOUNTER — Telehealth (INDEPENDENT_AMBULATORY_CARE_PROVIDER_SITE_OTHER): Payer: Self-pay | Admitting: Internal Medicine

## 2014-06-26 DIAGNOSIS — K7460 Unspecified cirrhosis of liver: Secondary | ICD-10-CM

## 2014-06-26 DIAGNOSIS — K769 Liver disease, unspecified: Secondary | ICD-10-CM

## 2014-06-26 NOTE — Telephone Encounter (Signed)
Results of [place] given to patient.  Ann, MRI abdomen with and without contrast

## 2014-06-26 NOTE — Addendum Note (Signed)
Addended by: Butch Penny on: 06/26/2014 04:47 PM   Modules accepted: Orders

## 2014-06-26 NOTE — Telephone Encounter (Signed)
MRI ordered

## 2014-06-27 ENCOUNTER — Encounter (INDEPENDENT_AMBULATORY_CARE_PROVIDER_SITE_OTHER): Payer: Self-pay

## 2014-06-27 ENCOUNTER — Telehealth (INDEPENDENT_AMBULATORY_CARE_PROVIDER_SITE_OTHER): Payer: Self-pay | Admitting: *Deleted

## 2014-06-27 DIAGNOSIS — K7460 Unspecified cirrhosis of liver: Secondary | ICD-10-CM

## 2014-06-27 NOTE — Telephone Encounter (Signed)
.  Per Lelon Perla patient to have labs in 6 months.

## 2014-06-27 NOTE — Telephone Encounter (Signed)
MRI sch'd 07/07/14 at 5 pm (430), patient aware

## 2014-07-07 ENCOUNTER — Ambulatory Visit (HOSPITAL_COMMUNITY)
Admission: RE | Admit: 2014-07-07 | Discharge: 2014-07-07 | Disposition: A | Discharge status: Home / Self Care | Payer: Medicare Other | Source: Ambulatory Visit | Attending: Internal Medicine | Admitting: Internal Medicine

## 2014-07-07 DIAGNOSIS — K769 Liver disease, unspecified: Secondary | ICD-10-CM

## 2014-07-07 DIAGNOSIS — K7460 Unspecified cirrhosis of liver: Secondary | ICD-10-CM

## 2014-07-08 ENCOUNTER — Telehealth (INDEPENDENT_AMBULATORY_CARE_PROVIDER_SITE_OTHER): Payer: Self-pay | Admitting: Internal Medicine

## 2014-07-08 DIAGNOSIS — K769 Liver disease, unspecified: Secondary | ICD-10-CM

## 2014-07-08 NOTE — Telephone Encounter (Signed)
errir

## 2014-07-08 NOTE — Telephone Encounter (Signed)
MR ordered.

## 2014-07-14 DIAGNOSIS — J209 Acute bronchitis, unspecified: Secondary | ICD-10-CM | POA: Diagnosis not present

## 2014-07-14 DIAGNOSIS — H6503 Acute serous otitis media, bilateral: Secondary | ICD-10-CM | POA: Diagnosis not present

## 2014-07-23 ENCOUNTER — Other Ambulatory Visit (INDEPENDENT_AMBULATORY_CARE_PROVIDER_SITE_OTHER): Payer: Self-pay | Admitting: Internal Medicine

## 2014-07-23 ENCOUNTER — Ambulatory Visit (HOSPITAL_COMMUNITY)
Admission: RE | Admit: 2014-07-23 | Discharge: 2014-07-23 | Disposition: A | Discharge status: Home / Self Care | Payer: Medicare Other | Source: Ambulatory Visit | Attending: Internal Medicine | Admitting: Internal Medicine

## 2014-07-23 DIAGNOSIS — K7460 Unspecified cirrhosis of liver: Secondary | ICD-10-CM

## 2014-07-23 DIAGNOSIS — K769 Liver disease, unspecified: Secondary | ICD-10-CM

## 2014-07-23 DIAGNOSIS — B182 Chronic viral hepatitis C: Secondary | ICD-10-CM | POA: Insufficient documentation

## 2014-07-23 DIAGNOSIS — R229 Localized swelling, mass and lump, unspecified: Secondary | ICD-10-CM | POA: Insufficient documentation

## 2014-07-23 LAB — POCT I-STAT CREATININE: Creatinine, Ser: 0.7 mg/dL (ref 0.44–1.00)

## 2014-07-23 MED ORDER — GADOXETATE DISODIUM 0.25 MMOL/ML IV SOLN
10.0000 mL | Freq: Once | INTRAVENOUS | Status: AC | PRN
  Administered 2014-07-23: 9 mL via INTRAVENOUS

## 2014-07-25 ENCOUNTER — Telehealth (INDEPENDENT_AMBULATORY_CARE_PROVIDER_SITE_OTHER): Payer: Self-pay | Admitting: Internal Medicine

## 2014-07-25 DIAGNOSIS — K769 Liver disease, unspecified: Secondary | ICD-10-CM

## 2014-07-25 NOTE — Telephone Encounter (Signed)
Order for liver biopsy.

## 2014-08-04 DIAGNOSIS — K7469 Other cirrhosis of liver: Secondary | ICD-10-CM | POA: Diagnosis not present

## 2014-08-04 DIAGNOSIS — D732 Chronic congestive splenomegaly: Secondary | ICD-10-CM | POA: Diagnosis not present

## 2014-08-04 DIAGNOSIS — R160 Hepatomegaly, not elsewhere classified: Secondary | ICD-10-CM | POA: Diagnosis not present

## 2014-08-04 DIAGNOSIS — E538 Deficiency of other specified B group vitamins: Secondary | ICD-10-CM | POA: Diagnosis not present

## 2014-08-04 DIAGNOSIS — C50411 Malignant neoplasm of upper-outer quadrant of right female breast: Secondary | ICD-10-CM | POA: Diagnosis not present

## 2014-08-04 DIAGNOSIS — C50911 Malignant neoplasm of unspecified site of right female breast: Secondary | ICD-10-CM | POA: Diagnosis not present

## 2014-08-04 DIAGNOSIS — D72819 Decreased white blood cell count, unspecified: Secondary | ICD-10-CM | POA: Diagnosis not present

## 2014-08-04 DIAGNOSIS — D508 Other iron deficiency anemias: Secondary | ICD-10-CM | POA: Diagnosis not present

## 2014-08-04 DIAGNOSIS — D696 Thrombocytopenia, unspecified: Secondary | ICD-10-CM | POA: Diagnosis not present

## 2014-08-12 ENCOUNTER — Other Ambulatory Visit: Payer: Self-pay | Admitting: Radiology

## 2014-08-13 ENCOUNTER — Other Ambulatory Visit: Payer: Self-pay | Admitting: Radiology

## 2014-08-14 ENCOUNTER — Ambulatory Visit (HOSPITAL_COMMUNITY)
Admission: RE | Admit: 2014-08-14 | Discharge: 2014-08-14 | Disposition: A | Discharge status: Home / Self Care | Payer: Medicare Other | Source: Ambulatory Visit | Attending: Internal Medicine | Admitting: Internal Medicine

## 2014-08-14 ENCOUNTER — Other Ambulatory Visit (INDEPENDENT_AMBULATORY_CARE_PROVIDER_SITE_OTHER): Payer: Self-pay | Admitting: Internal Medicine

## 2014-08-14 ENCOUNTER — Encounter (HOSPITAL_COMMUNITY): Payer: Self-pay

## 2014-08-14 DIAGNOSIS — K7460 Unspecified cirrhosis of liver: Secondary | ICD-10-CM | POA: Insufficient documentation

## 2014-08-14 DIAGNOSIS — Z853 Personal history of malignant neoplasm of breast: Secondary | ICD-10-CM | POA: Insufficient documentation

## 2014-08-14 DIAGNOSIS — E079 Disorder of thyroid, unspecified: Secondary | ICD-10-CM | POA: Insufficient documentation

## 2014-08-14 DIAGNOSIS — E785 Hyperlipidemia, unspecified: Secondary | ICD-10-CM | POA: Diagnosis not present

## 2014-08-14 DIAGNOSIS — K769 Liver disease, unspecified: Secondary | ICD-10-CM

## 2014-08-14 DIAGNOSIS — E119 Type 2 diabetes mellitus without complications: Secondary | ICD-10-CM | POA: Insufficient documentation

## 2014-08-14 DIAGNOSIS — I10 Essential (primary) hypertension: Secondary | ICD-10-CM | POA: Insufficient documentation

## 2014-08-14 DIAGNOSIS — R160 Hepatomegaly, not elsewhere classified: Secondary | ICD-10-CM | POA: Insufficient documentation

## 2014-08-14 DIAGNOSIS — Z79899 Other long term (current) drug therapy: Secondary | ICD-10-CM | POA: Diagnosis not present

## 2014-08-14 DIAGNOSIS — K7689 Other specified diseases of liver: Secondary | ICD-10-CM | POA: Diagnosis not present

## 2014-08-14 LAB — GLUCOSE, CAPILLARY: GLUCOSE-CAPILLARY: 147 mg/dL — AB (ref 65–99)

## 2014-08-14 LAB — CBC
HCT: 37.5 % (ref 36.0–46.0)
HEMOGLOBIN: 12.9 g/dL (ref 12.0–15.0)
MCH: 31.3 pg (ref 26.0–34.0)
MCHC: 34.4 g/dL (ref 30.0–36.0)
MCV: 91 fL (ref 78.0–100.0)
Platelets: 68 10*3/uL — ABNORMAL LOW (ref 150–400)
RBC: 4.12 MIL/uL (ref 3.87–5.11)
RDW: 14.4 % (ref 11.5–15.5)
WBC: 2.7 10*3/uL — ABNORMAL LOW (ref 4.0–10.5)

## 2014-08-14 LAB — PROTIME-INR
INR: 1.27 (ref 0.00–1.49)
Prothrombin Time: 16.1 seconds — ABNORMAL HIGH (ref 11.6–15.2)

## 2014-08-14 LAB — APTT: APTT: 31 s (ref 24–37)

## 2014-08-14 MED ORDER — GELATIN ABSORBABLE 12-7 MM EX MISC
CUTANEOUS | Status: AC
  Filled 2014-08-14: qty 1

## 2014-08-14 MED ORDER — MIDAZOLAM HCL 2 MG/2ML IJ SOLN
INTRAMUSCULAR | Status: AC
  Filled 2014-08-14: qty 2

## 2014-08-14 MED ORDER — FENTANYL CITRATE (PF) 100 MCG/2ML IJ SOLN
INTRAMUSCULAR | Status: AC
  Filled 2014-08-14: qty 2

## 2014-08-14 MED ORDER — SODIUM CHLORIDE 0.9 % IV SOLN
Freq: Once | INTRAVENOUS | Status: AC
  Administered 2014-08-14: 13:00:00 via INTRAVENOUS

## 2014-08-14 MED ORDER — LIDOCAINE-EPINEPHRINE 1 %-1:100000 IJ SOLN
INTRAMUSCULAR | Status: AC
  Filled 2014-08-14: qty 1

## 2014-08-14 NOTE — Progress Notes (Signed)
Time charted in error

## 2014-08-14 NOTE — H&P (Signed)
Chief Complaint: Liver lesion  Referring Physician(s): Setzer,Terri L Dr Laural Golden  History of Present Illness: Cheryl Velasquez is a 74 y.o. female   Hx Breast Ca Dx cirrhosis 2012 Routine follow up US liver Newly noted liver lesion  MRI 07/23/14: IMPRESSION: Subtle 1.4 cm subcapsular hypervascular nodule in segment 4A of the left hepatic lobe, suspicious for hepatocellular carcinoma. Consider ultrasound-guided needle biopsy versus continued followup by MRI.  Now scheduled for liver lesion bx per Dr Laural Golden  Hx thrombocytopenia   Past Medical History  Diagnosis Date  . History of breast cancer   . History of chicken pox   . Arthritis   . Diabetes mellitus   . Hyperlipidemia   . Hypertension   . Thyroid disease   . Cirrhosis     Past Surgical History  Procedure Laterality Date  . Hernia repair  2004  . Abdominal hysterectomy  1987  . Breast biopsy  2005  . Esophagogastroduodenoscopy N/A 09/20/2012    Procedure: ESOPHAGOGASTRODUODENOSCOPY (EGD);  Surgeon: Rogene Houston, MD;  [facility];  Service: Endoscopy;  Laterality: N/A;  125-moved to 12:00 Ann notified pt  . Esophagogastroduodenoscopy N/A 12/13/2012    Procedure: ESOPHAGOGASTRODUODENOSCOPY (EGD);  Surgeon: Rogene Houston, MD;  [facility];  Service: Endoscopy;  Laterality: N/A;  155-moved to 220 Ann to notify pt  . Esophageal banding N/A 12/13/2012    Procedure: ESOPHAGEAL BANDING;  Surgeon: Rogene Houston, MD;  [facility];  Service: Endoscopy;  Laterality: N/A;    Allergies: Review of patient's allergies indicates no known allergies.  Medications: Prior to Admission medications   Medication Sig Start Date End Date Taking? Authorizing Provider  albuterol (PROAIR HFA) 108 (90 BASE) MCG/ACT inhaler Inhale 2 puffs into the lungs every 6 (six) hours as needed for wheezing or shortness of breath.   Yes Historical Provider, MD  ALPRAZolam Duanne Moron) 0.5 MG tablet Take 0.5 mg by  mouth 2 (two) times daily as needed for anxiety.    Yes Historical Provider, MD  Biotin 5 MG CAPS Take 1 capsule by mouth once a week.   Yes Historical Provider, MD  Calcium Carbonate-Vitamin D (CALTRATE 600+D) 600-400 MG-UNIT per tablet Take 1 tablet by mouth daily.     Yes Historical Provider, MD  captopril (CAPOTEN) 25 MG tablet Take 25 mg by mouth 2 (two) times daily.     Yes Historical Provider, MD  furosemide (LASIX) 20 MG tablet Take 1 tablet by mouth daily. 10/07/13  Yes Historical Provider, MD  glipiZIDE (GLUCOTROL XL) 5 MG 24 hr tablet Take 5 mg by mouth daily.     Yes Historical Provider, MD  hydroxypropyl methylcellulose (ISOPTO TEARS) 2.5 % ophthalmic solution Place 1 drop into both eyes daily as needed (Dry Eyes).   Yes Historical Provider, MD  metFORMIN (GLUCOPHAGE-XR) 500 MG 24 hr tablet Take 1,000 mg by mouth 2 (two) times daily.    Yes Historical Provider, MD  Multiple Vitamins-Minerals (CENTRUM SILVER PO) Take 1 tablet by mouth daily.     Yes Historical Provider, MD  nadolol (CORGARD) 40 MG tablet TAKE 1 TABLET BY MOUTH EVERY DAY   Yes Terri L Setzer, NP  pantoprazole (PROTONIX) 40 MG tablet TAKE 1 TABLET BY MOUTH EVERY DAY 05/30/14  Yes Rogene Houston, MD  simvastatin (ZOCOR) 40 MG tablet Take 40 mg by mouth daily.     Yes Historical Provider, MD  vitamin C (ASCORBIC ACID) 500 MG tablet Take 500 mg  by mouth daily.   Yes Historical Provider, MD  vitamin E 400 UNIT capsule Take 400 Units by mouth daily.   Yes Historical Provider, MD     Family History  Problem Relation Age of Onset  . Cancer Other     Colon Cancer, Prostate Cancer-Other Blood Relative  . Hyperlipidemia Other     Other Blood Relative  . Stroke Other     Parent, Other Blood Relative  . Diabetes Other     Parent, Other Blood Relative  . Hypertension Other     Parent  . Prostate cancer Maternal Aunt   . Dementia Maternal Aunt   . Dementia Sister   . Dementia Sister   . Alzheimer's disease Sister   .  Liver disease Brother     History   Social History  . Marital Status: Single    Spouse Name: N/A  . Number of Children: 0  . Years of Education: 10   Occupational History  . Textiles    Social History Main Topics  . Smoking status: Never Smoker   . Smokeless tobacco: Never Used  . Alcohol Use: No  . Drug Use: No  . Sexual Activity: Not on file   Other Topics Concern  . None   Social History Narrative   Regular Exercise-no    Review of Systems: A 12 point ROS discussed and pertinent positives are indicated in the HPI above.  All other systems are negative.  Review of Systems  Constitutional: Negative for fever and activity change.  Respiratory: Positive for shortness of breath. Negative for cough.   Cardiovascular: Negative for chest pain.  Gastrointestinal: Positive for abdominal distention. Negative for nausea and abdominal pain.  Neurological: Positive for weakness.  Psychiatric/Behavioral: Negative for behavioral problems and confusion.    Vital Signs: BP 181/85 mmHg  Pulse 78  Temp(Src) 98.1 F (36.7 C) (Oral)  Resp 18  Ht 5' 2"  (1.575 m)  Wt 235 lb (106.595 kg)  BMI 42.97 kg/m2  SpO2 98%  Physical Exam  Constitutional: She is oriented to person, place, and time.  obese  Cardiovascular: Regular rhythm.   No murmur heard. Irreg rate  Pulmonary/Chest: Effort normal and breath sounds normal. She has no wheezes.  Abdominal: Soft. Bowel sounds are normal. She exhibits distension. There is no tenderness.  Neurological: She is alert and oriented to person, place, and time.  Skin: Skin is warm and dry.  Psychiatric: She has a normal mood and affect. Her behavior is normal. Judgment and thought content normal.  Nursing note and vitals reviewed.   Mallampati Score:  MD Evaluation Airway: WNL Heart: WNL Abdomen: WNL Chest/ Lungs: WNL ASA  Classification: 3 Mallampati/Airway Score: Two  Imaging: Mr Abdomen W Wo Contrast  07/23/2014   CLINICAL DATA:   Chronic hepatitis-C. Hepatic cirrhosis. Abdominal pain. 1.5 cm indeterminate left hepatic lobe lesion seen on recent ultrasound. Personal history of breast carcinoma.  EXAM: MRI ABDOMEN WITHOUT AND WITH CONTRAST  TECHNIQUE: Multiplanar multisequence MR imaging of the abdomen was performed both before and after the administration of intravenous contrast.  CONTRAST:  9 mL Eovist  COMPARISON:  Ultrasound on 06/16/2014 and CT on 02/05/2013  FINDINGS: Lower chest:  Unremarkable.  Hepatobiliary: Hepatic cirrhosis is demonstrated. There is a subtle anterior subcapsular nodule segment 4A of the left lobe which shows hypervascularity during arterial phase and shows lack of contrast retention on delayed hepatobiliary phase imaging. This measures 1.4 cm on image 18 of series 1600. No other liver masses  are identified.  No evidence of ascites. Gallbladder is unremarkable. No evidence of biliary ductal dilatation.  Pancreas: No masses, inflammatory changes, or fluid collections demonstrated.  Spleen: Mild splenomegaly measuring 14-15 cm in length, consistent with portal venous hypertension. No splenic masses identified.  Adrenal Glands:  No masses identified.  Kidneys: No renal masses identified. No evidence of hydronephrosis.  Stomach/Bowel/Peritoneum: No evidence of wall thickening, mass, or obstruction involving visualized abdominal bowel.  Vascular/Lymphatic: No pathologically enlarged lymph nodes identified. No other significant abnormality noted.  Other:  None.  Musculoskeletal:  No suspicious bone lesions identified.  IMPRESSION: Subtle 1.4 cm subcapsular hypervascular nodule in segment 4A of the left hepatic lobe, suspicious for hepatocellular carcinoma. Consider ultrasound-guided needle biopsy versus continued followup by MRI.  Hepatic cirrhosis, with mild splenomegaly consistent with portal venous hypertension. No evidence of ascites.  No evidence of abdominal metastatic disease.   Electronically Signed   By: Earle Gell M.D.   On: 07/23/2014 14:58   Mr Attempted Daymon Larsen Report  07/23/2014   This examination belongs to an outside facility and is stored  here for comparison purposes only.  Contact the originating outside  institution for any associated report or interpretation.   Labs:  CBC:  Recent Labs  11/26/13 1424 05/28/14 1110 08/14/14 1300  WBC 2.6* 2.9* 2.7*  HGB 12.4 14.0 12.9  HCT 37.5 41.3 37.5  PLT 70* 72* PENDING    COAGS:  Recent Labs  11/26/13 1424 05/28/14 1107  INR 1.15 1.25    BMP:  Recent Labs  07/23/14 1032  CREATININE 0.70    LIVER FUNCTION TESTS:  Recent Labs  11/26/13 1424 05/28/14 1109  BILITOT 1.0 1.2  AST 28 39*  ALT 20 24  ALKPHOS 67 53  PROT 6.6 6.8  ALBUMIN 3.5 3.6    TUMOR MARKERS:  Recent Labs  11/26/13 1424 05/28/14 1106  AFPTM 14.6* 13.3*    Assessment and Plan:  Liver lesion Hx Cirrhosis; Breast Ca Scheduled for liver lesion bx Risks and Benefits discussed with the patient including, but not limited to bleeding, infection, damage to adjacent structures or low yield requiring additional tests. All of the patient's questions were answered, patient is agreeable to proceed. Consent signed and in chart.   Thank you for this interesting consult.  I greatly enjoyed meeting Cheryl Velasquez and look forward to participating in their care.  Signed: Chairty Toman A 08/14/2014, 1:59 PM   I spent a total of  20 Minutes   in face to face in clinical consultation, greater than 50% of which was counseling/coordinating care for liver lesion bx

## 2014-08-14 NOTE — Sedation Documentation (Addendum)
Per MD, will not proceed with biopsy. Pt aware, family aware. Discharge from [place]. No sedation given.

## 2014-08-15 ENCOUNTER — Ambulatory Visit (HOSPITAL_COMMUNITY): Payer: Medicare Other

## 2014-08-15 DIAGNOSIS — D696 Thrombocytopenia, unspecified: Secondary | ICD-10-CM | POA: Diagnosis not present

## 2014-08-15 DIAGNOSIS — S61412A Laceration without foreign body of left hand, initial encounter: Secondary | ICD-10-CM | POA: Diagnosis not present

## 2014-08-19 ENCOUNTER — Telehealth (INDEPENDENT_AMBULATORY_CARE_PROVIDER_SITE_OTHER): Payer: Self-pay | Admitting: *Deleted

## 2014-08-19 DIAGNOSIS — M79642 Pain in left hand: Secondary | ICD-10-CM | POA: Diagnosis not present

## 2014-08-19 DIAGNOSIS — S61412D Laceration without foreign body of left hand, subsequent encounter: Secondary | ICD-10-CM | POA: Diagnosis not present

## 2014-08-19 NOTE — Telephone Encounter (Signed)
MRI in 3 month

## 2014-08-19 NOTE — Telephone Encounter (Signed)
Voice mail left 7/1 at 11:37  Someone called her.

## 2014-08-20 NOTE — Telephone Encounter (Signed)
3 month MRi noted in recall

## 2014-08-21 ENCOUNTER — Telehealth (INDEPENDENT_AMBULATORY_CARE_PROVIDER_SITE_OTHER): Payer: Self-pay | Admitting: *Deleted

## 2014-08-21 NOTE — Telephone Encounter (Signed)
Cheryl Velasquez, patient wants to know if her insurance will pay for the MRI

## 2014-08-21 NOTE — Telephone Encounter (Signed)
Patient asked that Terri call her back--message left on voice mail  [phone]

## 2014-08-21 NOTE — Telephone Encounter (Signed)
Spoke to patient

## 2014-09-01 DIAGNOSIS — S62309A Unspecified fracture of unspecified metacarpal bone, initial encounter for closed fracture: Secondary | ICD-10-CM | POA: Diagnosis not present

## 2014-09-11 DIAGNOSIS — S62347A Nondisplaced fracture of base of fifth metacarpal bone. left hand, initial encounter for closed fracture: Secondary | ICD-10-CM | POA: Diagnosis not present

## 2014-10-06 DIAGNOSIS — L570 Actinic keratosis: Secondary | ICD-10-CM | POA: Diagnosis not present

## 2014-10-09 DIAGNOSIS — S62347A Nondisplaced fracture of base of fifth metacarpal bone. left hand, initial encounter for closed fracture: Secondary | ICD-10-CM | POA: Diagnosis not present

## 2014-10-24 ENCOUNTER — Encounter: Payer: Self-pay | Admitting: *Deleted

## 2014-10-24 ENCOUNTER — Ambulatory Visit (INDEPENDENT_AMBULATORY_CARE_PROVIDER_SITE_OTHER): Payer: Medicare Other | Admitting: Cardiology

## 2014-10-24 ENCOUNTER — Encounter: Payer: Self-pay | Admitting: Cardiology

## 2014-10-24 VITALS — BP 136/76 | HR 73 | Ht 62.0 in | Wt 235.0 lb

## 2014-10-24 DIAGNOSIS — I10 Essential (primary) hypertension: Secondary | ICD-10-CM

## 2014-10-24 DIAGNOSIS — I5032 Chronic diastolic (congestive) heart failure: Secondary | ICD-10-CM | POA: Diagnosis not present

## 2014-10-24 NOTE — Patient Instructions (Signed)
Your physician wants you to follow-up in: 1 year with Dr. Bryna Colander will receive a reminder letter in the mail two months in advance. If you don't receive a letter, please call our office to schedule the follow-up appointment.  Your physician recommends that you continue on your current medications as directed. Please refer to the Current Medication list given to you today.  WE WILL REQUEST LABS  Thank you for choosing [facility]!!

## 2014-10-24 NOTE — Progress Notes (Signed)
Patient ID: Cheryl Velasquez, female   DOB: [birth year], 74 y.o.   MRN: [id number]     Clinical Summary [name] is a 74 y.o.female seen today for follow up of the following medical problems.   1. Chronic diastolic heart failure.  - echo 10/30/13 LVEF 55-60%, grade II diastolic dysfunction   - denies any SOB or DOE. Denies any LE edema. - compliant with meds, not requiring her prn lasix often   2. HTN - does not check at home - compliant with meds  Past Medical History  Diagnosis Date  . History of breast cancer   . History of chicken pox   . Arthritis   . Diabetes mellitus   . Hyperlipidemia   . Hypertension   . Thyroid disease   . Cirrhosis      No Known Allergies   Current Outpatient Prescriptions  Medication Sig Dispense Refill  . albuterol (PROAIR HFA) 108 (90 BASE) MCG/ACT inhaler Inhale 2 puffs into the lungs every 6 (six) hours as needed for wheezing or shortness of breath.    . ALPRAZolam (XANAX) 0.5 MG tablet Take 0.5 mg by mouth 2 (two) times daily as needed for anxiety.     . Biotin 5 MG CAPS Take 1 capsule by mouth once a week.    . Calcium Carbonate-Vitamin D (CALTRATE 600+D) 600-400 MG-UNIT per tablet Take 1 tablet by mouth daily.      . captopril (CAPOTEN) 25 MG tablet Take 25 mg by mouth 2 (two) times daily.      . furosemide (LASIX) 20 MG tablet Take 1 tablet by mouth daily.    Marland Kitchen glipiZIDE (GLUCOTROL XL) 5 MG 24 hr tablet Take 5 mg by mouth daily.      . hydroxypropyl methylcellulose (ISOPTO TEARS) 2.5 % ophthalmic solution Place 1 drop into both eyes daily as needed (Dry Eyes).    . metFORMIN (GLUCOPHAGE-XR) 500 MG 24 hr tablet Take 1,000 mg by mouth 2 (two) times daily.     . Multiple Vitamins-Minerals (CENTRUM SILVER PO) Take 1 tablet by mouth daily.      . nadolol (CORGARD) 40 MG tablet TAKE 1 TABLET BY MOUTH EVERY DAY 30 tablet 5  . pantoprazole (PROTONIX) 40 MG tablet TAKE 1 TABLET BY MOUTH EVERY DAY 30 tablet 5  . simvastatin (ZOCOR) 40 MG tablet  Take 40 mg by mouth daily.      . vitamin C (ASCORBIC ACID) 500 MG tablet Take 500 mg by mouth daily.    . vitamin E 400 UNIT capsule Take 400 Units by mouth daily.     No current facility-administered medications for this visit.     Past Surgical History  Procedure Laterality Date  . Hernia repair  2004  . Abdominal hysterectomy  1987  . Breast biopsy  2005  . Esophagogastroduodenoscopy N/A 09/20/2012    Procedure: ESOPHAGOGASTRODUODENOSCOPY (EGD);  Surgeon: Rogene Houston, MD;  [facility];  Service: Endoscopy;  Laterality: N/A;  125-moved to 12:00 Ann notified pt  . Esophagogastroduodenoscopy N/A 12/13/2012    Procedure: ESOPHAGOGASTRODUODENOSCOPY (EGD);  Surgeon: Rogene Houston, MD;  [facility];  Service: Endoscopy;  Laterality: N/A;  155-moved to 220 Ann to notify pt  . Esophageal banding N/A 12/13/2012    Procedure: ESOPHAGEAL BANDING;  Surgeon: Rogene Houston, MD;  [facility];  Service: Endoscopy;  Laterality: N/A;     No Known Allergies    Family History  Problem Relation Age of  Onset  . Cancer Other     Colon Cancer, Prostate Cancer-Other Blood Relative  . Hyperlipidemia Other     Other Blood Relative  . Stroke Other     Parent, Other Blood Relative  . Diabetes Other     Parent, Other Blood Relative  . Hypertension Other     Parent  . Prostate cancer Maternal Aunt   . Dementia Maternal Aunt   . Dementia Sister   . Dementia Sister   . Alzheimer's disease Sister   . Liver disease Brother      Social History [name] reports that she has never smoked. She has never used smokeless tobacco. [name] reports that she does not drink alcohol.   Review of Systems CONSTITUTIONAL: No weight loss, fever, chills, weakness or fatigue.  HEENT: Eyes: No visual loss, blurred vision, double vision or yellow sclerae.No hearing loss, sneezing, congestion, runny nose or sore throat.  SKIN: No rash or itching.  CARDIOVASCULAR: per  HPI RESPIRATORY: No shortness of breath, cough or sputum.  GASTROINTESTINAL: No anorexia, nausea, vomiting or diarrhea. No abdominal pain or blood.  GENITOURINARY: No burning on urination, no polyuria NEUROLOGICAL: No headache, dizziness, syncope, paralysis, ataxia, numbness or tingling in the extremities. No change in bowel or bladder control.  MUSCULOSKELETAL: No muscle, back pain, joint pain or stiffness.  LYMPHATICS: No enlarged nodes. No history of splenectomy.  PSYCHIATRIC: No history of depression or anxiety.  ENDOCRINOLOGIC: No reports of sweating, cold or heat intolerance. No polyuria or polydipsia.  Marland Kitchen   Physical Examination Filed Vitals:   10/24/14 1026  BP: 136/76  Pulse: 73   Filed Vitals:   10/24/14 1026  Height: 5' 2"  (1.575 m)  Weight: 235 lb (106.595 kg)    Gen: resting comfortably, no acute distress HEENT: no scleral icterus, pupils equal round and reactive, no palptable cervical adenopathy,  CV: RRR, no m/r/g, no jvd Resp: Clear to auscultation bilaterally GI: abdomen is soft, non-tender, non-distended, normal bowel sounds, no hepatosplenomegaly MSK: extremities are warm, no edema.  Skin: warm, no rash Neuro:  no focal deficits Psych: appropriate affect   Diagnostic Studies 10/30/13 Echo Study Conclusions  - Left ventricle: The cavity size was normal. Wall thickness was normal. Systolic function was normal. The estimated ejection fraction was in the range of 55% to 60%. Wall motion was normal; there were no regional wall motion abnormalities. Features are consistent with a pseudonormal left ventricular filling pattern, with concomitant abnormal relaxation and increased filling pressure (grade 2 diastolic dysfunction). - Aortic valve: Mildly to moderately calcified annulus. Trileaflet; mildly calcified leaflets. There was no significant regurgitation. - Mitral valve: Moderately calcified annulus. There was mild regurgitation. - Left atrium: The  atrium was moderately to severely dilated. - Right ventricle: The cavity size was mildly dilated. - Right atrium: The atrium was moderately dilated. - Tricuspid valve: There was trivial regurgitation. - Pulmonary arteries: Systolic pressure could not be accurately estimated. - Inferior vena cava: Not visualized. Unable to estimate CVP. - Pericardium, extracardiac: There was no pericardial effusion.  Impressions:  - Overall normal LV wall thickness and chamber size with LVEF 55-60%. Grade 2 diastolic dysfunction. Moderate to severe left atrial enlargement. Moderately calcified mitral annulus with mild mitral regurgitation. Mildly sclerotic aortic valve. Mildly dilated RV with normal contraction. Moderate right atrial enlargement. Trivial tricuspid regurgitation, unable to assess PASP or CVP.   10/2013 PFTs: moderate obstruction    Assessment and Plan  1. Chronic diastolic heart failure - appears euvolemic today,  no current symptoms - continue current meds  2. HTN - at goal, continue current meds    F/u 1 year      Arnoldo Lenis, M.D.

## 2014-11-08 DIAGNOSIS — H6501 Acute serous otitis media, right ear: Secondary | ICD-10-CM | POA: Diagnosis not present

## 2014-11-08 DIAGNOSIS — J019 Acute sinusitis, unspecified: Secondary | ICD-10-CM | POA: Diagnosis not present

## 2014-11-11 ENCOUNTER — Encounter (INDEPENDENT_AMBULATORY_CARE_PROVIDER_SITE_OTHER): Payer: Self-pay | Admitting: *Deleted

## 2014-11-16 DIAGNOSIS — N939 Abnormal uterine and vaginal bleeding, unspecified: Secondary | ICD-10-CM | POA: Diagnosis not present

## 2014-11-16 DIAGNOSIS — Z79899 Other long term (current) drug therapy: Secondary | ICD-10-CM | POA: Diagnosis not present

## 2014-11-16 DIAGNOSIS — Z90710 Acquired absence of both cervix and uterus: Secondary | ICD-10-CM | POA: Diagnosis not present

## 2014-11-16 DIAGNOSIS — Z853 Personal history of malignant neoplasm of breast: Secondary | ICD-10-CM | POA: Diagnosis not present

## 2014-11-16 DIAGNOSIS — E119 Type 2 diabetes mellitus without complications: Secondary | ICD-10-CM | POA: Diagnosis not present

## 2014-11-16 DIAGNOSIS — I10 Essential (primary) hypertension: Secondary | ICD-10-CM | POA: Diagnosis not present

## 2014-11-16 DIAGNOSIS — E079 Disorder of thyroid, unspecified: Secondary | ICD-10-CM | POA: Diagnosis not present

## 2014-11-16 DIAGNOSIS — E7800 Pure hypercholesterolemia, unspecified: Secondary | ICD-10-CM | POA: Diagnosis not present

## 2014-11-16 DIAGNOSIS — N3090 Cystitis, unspecified without hematuria: Secondary | ICD-10-CM | POA: Diagnosis not present

## 2014-11-17 DIAGNOSIS — I10 Essential (primary) hypertension: Secondary | ICD-10-CM | POA: Diagnosis not present

## 2014-11-17 DIAGNOSIS — E782 Mixed hyperlipidemia: Secondary | ICD-10-CM | POA: Diagnosis not present

## 2014-11-17 DIAGNOSIS — E1165 Type 2 diabetes mellitus with hyperglycemia: Secondary | ICD-10-CM | POA: Diagnosis not present

## 2014-11-17 DIAGNOSIS — E039 Hypothyroidism, unspecified: Secondary | ICD-10-CM | POA: Diagnosis not present

## 2014-11-17 DIAGNOSIS — D649 Anemia, unspecified: Secondary | ICD-10-CM | POA: Diagnosis not present

## 2014-11-24 DIAGNOSIS — D696 Thrombocytopenia, unspecified: Secondary | ICD-10-CM | POA: Diagnosis not present

## 2014-11-24 DIAGNOSIS — I10 Essential (primary) hypertension: Secondary | ICD-10-CM | POA: Diagnosis not present

## 2014-11-24 DIAGNOSIS — K7460 Unspecified cirrhosis of liver: Secondary | ICD-10-CM | POA: Diagnosis not present

## 2014-11-24 DIAGNOSIS — I5030 Unspecified diastolic (congestive) heart failure: Secondary | ICD-10-CM | POA: Diagnosis not present

## 2014-11-24 DIAGNOSIS — D649 Anemia, unspecified: Secondary | ICD-10-CM | POA: Diagnosis not present

## 2014-11-24 DIAGNOSIS — K219 Gastro-esophageal reflux disease without esophagitis: Secondary | ICD-10-CM | POA: Diagnosis not present

## 2014-11-24 DIAGNOSIS — E039 Hypothyroidism, unspecified: Secondary | ICD-10-CM | POA: Diagnosis not present

## 2014-11-24 DIAGNOSIS — F419 Anxiety disorder, unspecified: Secondary | ICD-10-CM | POA: Diagnosis not present

## 2014-11-24 DIAGNOSIS — E782 Mixed hyperlipidemia: Secondary | ICD-10-CM | POA: Diagnosis not present

## 2014-11-24 DIAGNOSIS — R319 Hematuria, unspecified: Secondary | ICD-10-CM | POA: Diagnosis not present

## 2014-11-24 DIAGNOSIS — E1165 Type 2 diabetes mellitus with hyperglycemia: Secondary | ICD-10-CM | POA: Diagnosis not present

## 2014-11-29 ENCOUNTER — Other Ambulatory Visit (INDEPENDENT_AMBULATORY_CARE_PROVIDER_SITE_OTHER): Payer: Self-pay | Admitting: Internal Medicine

## 2014-12-01 ENCOUNTER — Telehealth (INDEPENDENT_AMBULATORY_CARE_PROVIDER_SITE_OTHER): Payer: Self-pay | Admitting: *Deleted

## 2014-12-01 NOTE — Telephone Encounter (Signed)
LM asking to speak with Deberah Castle, NP. The return phone number is [phone].

## 2014-12-02 NOTE — Telephone Encounter (Signed)
Had questions about [place].

## 2014-12-04 ENCOUNTER — Encounter (INDEPENDENT_AMBULATORY_CARE_PROVIDER_SITE_OTHER): Payer: Self-pay | Admitting: Internal Medicine

## 2014-12-04 ENCOUNTER — Ambulatory Visit (INDEPENDENT_AMBULATORY_CARE_PROVIDER_SITE_OTHER): Payer: Medicare Other | Admitting: Internal Medicine

## 2014-12-04 VITALS — BP 132/80 | HR 80 | Temp 97.0°F | Ht 63.0 in | Wt 239.0 lb

## 2014-12-04 DIAGNOSIS — A09 Infectious gastroenteritis and colitis, unspecified: Secondary | ICD-10-CM | POA: Diagnosis not present

## 2014-12-04 DIAGNOSIS — Z90710 Acquired absence of both cervix and uterus: Secondary | ICD-10-CM | POA: Diagnosis not present

## 2014-12-04 DIAGNOSIS — N390 Urinary tract infection, site not specified: Secondary | ICD-10-CM | POA: Diagnosis not present

## 2014-12-04 DIAGNOSIS — R188 Other ascites: Secondary | ICD-10-CM | POA: Diagnosis not present

## 2014-12-04 DIAGNOSIS — N939 Abnormal uterine and vaginal bleeding, unspecified: Secondary | ICD-10-CM | POA: Diagnosis not present

## 2014-12-04 NOTE — Patient Instructions (Addendum)
OV in 6 months. Call and schedule MRI Imodium one am and one in pm Call me in about 2 weeks with a PR

## 2014-12-04 NOTE — Progress Notes (Signed)
Subjective:    Patient ID: Cheryl Velasquez, female    DOB: [birth year], 74 y.o.   MRN: [id number]  HPI Here today for f/u of NAFLD.   74 yr old female with hx of biopsy proven stage 4 cirrhosis secondary to NAFLD. Liver biopsy in 2012. Her Mitochondrial antibody was positive at low titer but biopsy did not show typical changes of PBC. She has undergone esophageal variceal banding twice. First session on 09/20/2012 and second session was on 12/13/2012. Done primary prophylaxis.  Patient is to be scheduled for a repeat MRI of the abdomen. He appetite is good. She has lost 3 pounds since her last visit in April. She usually has a BM x 6 a day for several months.  Stools are loose. Some stools are formed.  She has not tried anything. Recently on Keflex for a bladder infection.  No melena or BRRB. 11/18/2014 H and H 12.4 and 38.4, MCV 95, platelet ct 85, total bili 1.4, ALP 61, AST 30, ALT 18,    08/14/2014 [place] limited:   IMPRESSION: 1. Nonvisualization of the ill-defined approximately 1.4 cm hypervascular lesion within the dome of the left lobe of the liver seen on preceding abdominal MRI. As such, no biopsy performed. 2. As detailed above, the apparent lesion seen prior abdominal ultrasound performed 06/16/2014 indeed represents the fissure for ligamentum teres and not a discrete lesion. Additionally, this pseudo lesion does not correlate with the ill-defined hypervascular lesions seen on abdominal MRI.     06/03/2013 US abdomen: Cirrhosis, No focal hepatic lesion identified. Cholelithiasis without associated findings to suggest acute cholecystitis.     10/02/2013 H and H 9.8 and 31.0, Platelet ct 91, AFP 9.7   10/30/2013 Echocardiogram: Dr. Deretha Emory - Overall normal LV wall thickness and chamber size with LVEF 55-60%. Grade 2 diastolic dysfunction. Moderate to severe left atrial enlargement. Moderately calcified mitral annulus with mild mitral regurgitation. Mildly sclerotic  aortic valve. Mildly dilated RV with normal contraction. Moderate right atrial enlargement. Trivial tricuspid regurgitation, unable to assess PASP or CVP.  Review of Systems     Past Medical History  Diagnosis Date  . History of breast cancer   . History of chicken pox   . Arthritis   . Diabetes mellitus   . Hyperlipidemia   . Hypertension   . Thyroid disease   . Cirrhosis     Past Surgical History  Procedure Laterality Date  . Hernia repair  2004  . Abdominal hysterectomy  1987  . Breast biopsy  2005  . Esophagogastroduodenoscopy N/A 09/20/2012    Procedure: ESOPHAGOGASTRODUODENOSCOPY (EGD);  Surgeon: Rogene Houston, MD;  [facility];  Service: Endoscopy;  Laterality: N/A;  125-moved to 12:00 Ann notified pt  . Esophagogastroduodenoscopy N/A 12/13/2012    Procedure: ESOPHAGOGASTRODUODENOSCOPY (EGD);  Surgeon: Rogene Houston, MD;  [facility];  Service: Endoscopy;  Laterality: N/A;  155-moved to 220 Ann to notify pt  . Esophageal banding N/A 12/13/2012    Procedure: ESOPHAGEAL BANDING;  Surgeon: Rogene Houston, MD;  [facility];  Service: Endoscopy;  Laterality: N/A;    No Known Allergies  Current Outpatient Prescriptions on File Prior to Visit  Medication Sig Dispense Refill  . albuterol (PROAIR HFA) 108 (90 BASE) MCG/ACT inhaler Inhale 2 puffs into the lungs every 6 (six) hours as needed for wheezing or shortness of breath.    . ALPRAZolam (XANAX) 0.5 MG tablet Take 0.5 mg by mouth 2 (two) times  daily as needed for anxiety.     . Calcium Carbonate-Vitamin D (CALTRATE 600+D) 600-400 MG-UNIT per tablet Take 1 tablet by mouth daily.      . captopril (CAPOTEN) 25 MG tablet Take 25 mg by mouth 2 (two) times daily.      . furosemide (LASIX) 20 MG tablet Take 1 tablet by mouth daily as needed.     Marland Kitchen glipiZIDE (GLUCOTROL XL) 5 MG 24 hr tablet Take 5 mg by mouth daily.      . hydroxypropyl methylcellulose (ISOPTO TEARS) 2.5 % ophthalmic  solution Place 1 drop into both eyes daily as needed (Dry Eyes).    . metFORMIN (GLUCOPHAGE-XR) 500 MG 24 hr tablet Take 1,000 mg by mouth 2 (two) times daily.     . Multiple Vitamins-Minerals (CENTRUM SILVER PO) Take 1 tablet by mouth daily.      . nadolol (CORGARD) 40 MG tablet TAKE 1 TABLET BY MOUTH EVERY DAY 30 tablet 5  . nadolol (CORGARD) 40 MG tablet TAKE 1 TABLET BY MOUTH EVERY DAY 30 tablet 5  . pantoprazole (PROTONIX) 40 MG tablet TAKE 1 TABLET BY MOUTH EVERY DAY 30 tablet 5  . simvastatin (ZOCOR) 40 MG tablet Take 40 mg by mouth daily.      . vitamin C (ASCORBIC ACID) 500 MG tablet Take 500 mg by mouth daily.    . vitamin E 400 UNIT capsule Take 400 Units by mouth daily.     No current facility-administered medications on file prior to visit.     Objective:   Physical Exam Blood pressure 132/80, pulse 80, temperature 97 F (36.1 C), height 5' 3"  (1.6 m), weight 239 lb (108.41 kg). Alert and oriented. Skin warm and dry. Oral mucosa is moist.   . Sclera anicteric, conjunctivae is pink. Thyroid not enlarged. No cervical lymphadenopathy. Lungs clear. Heart regular rate and rhythm.  Abdomen is soft. Bowel sounds are positive. No hepatomegaly. No abdominal masses felt. No tenderness. Abdomen is obese.  No edema to lower extremities.          Assessment & Plan:  NAFLD. Due for a surveillance MRI. Diarrhea. She is having some normal stools. Doubt infectious. Am going to try her on Imodium BID and she will let me know how she is doing in two weeks. OV in 6 months.

## 2014-12-05 ENCOUNTER — Encounter (INDEPENDENT_AMBULATORY_CARE_PROVIDER_SITE_OTHER): Payer: Self-pay

## 2014-12-10 ENCOUNTER — Other Ambulatory Visit (INDEPENDENT_AMBULATORY_CARE_PROVIDER_SITE_OTHER): Payer: Self-pay | Admitting: Internal Medicine

## 2014-12-10 DIAGNOSIS — K769 Liver disease, unspecified: Secondary | ICD-10-CM

## 2014-12-11 ENCOUNTER — Encounter (INDEPENDENT_AMBULATORY_CARE_PROVIDER_SITE_OTHER): Payer: Self-pay | Admitting: *Deleted

## 2014-12-11 ENCOUNTER — Other Ambulatory Visit (INDEPENDENT_AMBULATORY_CARE_PROVIDER_SITE_OTHER): Payer: Self-pay | Admitting: *Deleted

## 2014-12-11 DIAGNOSIS — K7460 Unspecified cirrhosis of liver: Secondary | ICD-10-CM

## 2014-12-15 DIAGNOSIS — Z23 Encounter for immunization: Secondary | ICD-10-CM | POA: Diagnosis not present

## 2014-12-23 DIAGNOSIS — N362 Urethral caruncle: Secondary | ICD-10-CM | POA: Diagnosis not present

## 2014-12-23 DIAGNOSIS — Z6841 Body Mass Index (BMI) 40.0 and over, adult: Secondary | ICD-10-CM | POA: Diagnosis not present

## 2014-12-24 DIAGNOSIS — K7460 Unspecified cirrhosis of liver: Secondary | ICD-10-CM | POA: Diagnosis not present

## 2014-12-25 LAB — CBC
HCT: 38.5 % (ref 36.0–46.0)
Hemoglobin: 12.6 g/dL (ref 12.0–15.0)
MCH: 30.4 pg (ref 26.0–34.0)
MCHC: 32.7 g/dL (ref 30.0–36.0)
MCV: 93 fL (ref 78.0–100.0)
MPV: 10.4 fL (ref 8.6–12.4)
PLATELETS: 70 10*3/uL — AB (ref 150–400)
RBC: 4.14 MIL/uL (ref 3.87–5.11)
RDW: 15.2 % (ref 11.5–15.5)
WBC: 2.2 10*3/uL — ABNORMAL LOW (ref 4.0–10.5)

## 2014-12-25 LAB — HEPATIC FUNCTION PANEL
ALBUMIN: 3.3 g/dL — AB (ref 3.6–5.1)
ALK PHOS: 65 U/L (ref 33–130)
ALT: 19 U/L (ref 6–29)
AST: 31 U/L (ref 10–35)
Bilirubin, Direct: 0.3 mg/dL — ABNORMAL HIGH (ref <=0.2)
Indirect Bilirubin: 0.8 mg/dL (ref 0.2–1.2)
TOTAL PROTEIN: 6.6 g/dL (ref 6.1–8.1)
Total Bilirubin: 1.1 mg/dL (ref 0.2–1.2)

## 2014-12-25 LAB — AFP TUMOR MARKER: AFP TUMOR MARKER: 12.2 ng/mL — AB (ref <6.1)

## 2014-12-29 ENCOUNTER — Telehealth (INDEPENDENT_AMBULATORY_CARE_PROVIDER_SITE_OTHER): Payer: Self-pay | Admitting: *Deleted

## 2014-12-29 NOTE — Telephone Encounter (Signed)
Cheryl Velasquez is taking Pantoprazole and Nadolol. She has brought by a letter received from [facility] for drug coverage changes. Patient advised our office will call her back about this by the end of the week. The return phone number is [phone].

## 2014-12-31 NOTE — Telephone Encounter (Signed)
Dr.Rehman will review and determine what medication they are requesting for the 2017 year is best for her. Then patient will be notified.

## 2015-01-02 ENCOUNTER — Encounter (INDEPENDENT_AMBULATORY_CARE_PROVIDER_SITE_OTHER): Payer: Self-pay | Admitting: *Deleted

## 2015-01-05 ENCOUNTER — Ambulatory Visit (HOSPITAL_COMMUNITY)
Admission: RE | Admit: 2015-01-05 | Discharge: 2015-01-05 | Disposition: A | Discharge status: Home / Self Care | Payer: Medicare Other | Source: Ambulatory Visit | Attending: Internal Medicine | Admitting: Internal Medicine

## 2015-01-05 DIAGNOSIS — R109 Unspecified abdominal pain: Secondary | ICD-10-CM | POA: Insufficient documentation

## 2015-01-05 DIAGNOSIS — B182 Chronic viral hepatitis C: Secondary | ICD-10-CM | POA: Insufficient documentation

## 2015-01-05 DIAGNOSIS — Z853 Personal history of malignant neoplasm of breast: Secondary | ICD-10-CM | POA: Insufficient documentation

## 2015-01-05 DIAGNOSIS — R188 Other ascites: Secondary | ICD-10-CM | POA: Diagnosis not present

## 2015-01-05 DIAGNOSIS — K8020 Calculus of gallbladder without cholecystitis without obstruction: Secondary | ICD-10-CM | POA: Diagnosis not present

## 2015-01-05 DIAGNOSIS — K7689 Other specified diseases of liver: Secondary | ICD-10-CM | POA: Insufficient documentation

## 2015-01-05 DIAGNOSIS — K7460 Unspecified cirrhosis of liver: Secondary | ICD-10-CM | POA: Diagnosis not present

## 2015-01-05 DIAGNOSIS — K769 Liver disease, unspecified: Secondary | ICD-10-CM

## 2015-01-05 LAB — CREATININE, SERUM
Creatinine, Ser: 0.61 mg/dL (ref 0.44–1.00)
GFR calc Af Amer: >60 mL/min (ref >60)
GFR calc non Af Amer: >60 mL/min (ref >60)

## 2015-01-05 MED ORDER — GADOXETATE DISODIUM 0.25 MMOL/ML IV SOLN
10.0000 mL | Freq: Once | INTRAVENOUS | Status: AC | PRN
  Administered 2015-01-05: 11 mL via INTRAVENOUS

## 2015-01-12 ENCOUNTER — Telehealth (INDEPENDENT_AMBULATORY_CARE_PROVIDER_SITE_OTHER): Payer: Self-pay | Admitting: *Deleted

## 2015-01-12 NOTE — Telephone Encounter (Signed)
[place] sch'd 01/22/15 at 800 am (730) at [facility], npo after midnight, patient aware

## 2015-01-12 NOTE — Telephone Encounter (Signed)
Left message 11/23 at 4:56 pm  Terri wants her to have an [place] at [facility].  She wants to know can she have in [place] at [facility] convenient.  Cannot do on a Tuesday or Wednesday.  Please call her back.

## 2015-01-22 DIAGNOSIS — K769 Liver disease, unspecified: Secondary | ICD-10-CM | POA: Diagnosis not present

## 2015-01-22 DIAGNOSIS — K8020 Calculus of gallbladder without cholecystitis without obstruction: Secondary | ICD-10-CM | POA: Diagnosis not present

## 2015-02-03 DIAGNOSIS — D72819 Decreased white blood cell count, unspecified: Secondary | ICD-10-CM | POA: Diagnosis not present

## 2015-02-03 DIAGNOSIS — E538 Deficiency of other specified B group vitamins: Secondary | ICD-10-CM | POA: Diagnosis not present

## 2015-02-03 DIAGNOSIS — D696 Thrombocytopenia, unspecified: Secondary | ICD-10-CM | POA: Diagnosis not present

## 2015-02-03 DIAGNOSIS — K7469 Other cirrhosis of liver: Secondary | ICD-10-CM | POA: Diagnosis not present

## 2015-02-03 DIAGNOSIS — C50911 Malignant neoplasm of unspecified site of right female breast: Secondary | ICD-10-CM | POA: Diagnosis not present

## 2015-02-03 DIAGNOSIS — D508 Other iron deficiency anemias: Secondary | ICD-10-CM | POA: Diagnosis not present

## 2015-02-03 DIAGNOSIS — R160 Hepatomegaly, not elsewhere classified: Secondary | ICD-10-CM | POA: Diagnosis not present

## 2015-02-03 DIAGNOSIS — C50411 Malignant neoplasm of upper-outer quadrant of right female breast: Secondary | ICD-10-CM | POA: Diagnosis not present

## 2015-02-03 DIAGNOSIS — D732 Chronic congestive splenomegaly: Secondary | ICD-10-CM | POA: Diagnosis not present

## 2015-02-18 ENCOUNTER — Encounter (INDEPENDENT_AMBULATORY_CARE_PROVIDER_SITE_OTHER): Payer: Self-pay

## 2015-02-18 ENCOUNTER — Telehealth (INDEPENDENT_AMBULATORY_CARE_PROVIDER_SITE_OTHER): Payer: Self-pay | Admitting: Internal Medicine

## 2015-02-18 NOTE — Telephone Encounter (Signed)
Ms. Pepitone called saying she has a question about a medication and where to get it but wasn't specific. Please give her a call.  Pt's ph# [phone] Thank you.

## 2015-02-19 NOTE — Telephone Encounter (Signed)
Talked with the patient and her new Rx has been called into [facility].

## 2015-03-09 DIAGNOSIS — D485 Neoplasm of uncertain behavior of skin: Secondary | ICD-10-CM | POA: Diagnosis not present

## 2015-03-09 DIAGNOSIS — C44629 Squamous cell carcinoma of skin of left upper limb, including shoulder: Secondary | ICD-10-CM | POA: Diagnosis not present

## 2015-03-20 DIAGNOSIS — Z1231 Encounter for screening mammogram for malignant neoplasm of breast: Secondary | ICD-10-CM | POA: Diagnosis not present

## 2015-04-02 DIAGNOSIS — C44629 Squamous cell carcinoma of skin of left upper limb, including shoulder: Secondary | ICD-10-CM | POA: Diagnosis not present

## 2015-04-07 ENCOUNTER — Encounter: Payer: Self-pay | Admitting: Endocrinology

## 2015-04-07 ENCOUNTER — Ambulatory Visit (INDEPENDENT_AMBULATORY_CARE_PROVIDER_SITE_OTHER): Payer: Medicare Other | Admitting: Endocrinology

## 2015-04-07 VITALS — BP 122/80 | HR 70 | Temp 97.5°F | Ht 63.0 in | Wt 237.0 lb

## 2015-04-07 DIAGNOSIS — E0590 Thyrotoxicosis, unspecified without thyrotoxic crisis or storm: Secondary | ICD-10-CM | POA: Diagnosis not present

## 2015-04-07 DIAGNOSIS — E042 Nontoxic multinodular goiter: Secondary | ICD-10-CM

## 2015-04-07 LAB — TSH: TSH: 2.42 u[IU]/mL (ref 0.35–4.50)

## 2015-04-07 NOTE — Patient Instructions (Addendum)
A thyroid blood test is requested for you today.  We'll let you know about the results. You can skip the ultrasound again this year.   Please come back for a follow-up appointment in 1 year.  most of the time, a "lumpy thyroid" will eventually become overactive again.  this is usually a slow process, happening over the span of many years.

## 2015-04-07 NOTE — Progress Notes (Signed)
Subjective:    Patient ID: Cheryl Velasquez, female    DOB: 01-11-1941, 75 y.o.   MRN: [id number]  HPI Pt returns for f/u of hyperthyroidism (due to multinodular goiter; dx'ed March of 2013; she presented with a thyroid nodule, cold on scan; bx was benign; she then had I-131 rx; TSH normalized, and has stayed normal since then).  pt states she feels well in general.  She does not notice the goiter.   Past Medical History  Diagnosis Date  . History of breast cancer   . History of chicken pox   . Arthritis   . Diabetes mellitus   . Hyperlipidemia   . Hypertension   . Thyroid disease   . Cirrhosis [facility])     Past Surgical History  Procedure Laterality Date  . Hernia repair  2004  . Abdominal hysterectomy  1987  . Breast biopsy  2005  . Esophagogastroduodenoscopy N/A 09/20/2012    Procedure: ESOPHAGOGASTRODUODENOSCOPY (EGD);  Surgeon: Rogene Houston, MD;  [facility]: [facility];  Service: Endoscopy;  Laterality: N/A;  125-moved to 12:00 Ann notified pt  . Esophagogastroduodenoscopy N/A 12/13/2012    Procedure: ESOPHAGOGASTRODUODENOSCOPY (EGD);  Surgeon: Rogene Houston, MD;  Location: [facility];  Service: Endoscopy;  Laterality: N/A;  155-moved to 220 Ann to notify pt  . Esophageal banding N/A 12/13/2012    Procedure: ESOPHAGEAL BANDING;  Surgeon: Rogene Houston, MD;  Location: [facility];  Service: Endoscopy;  Laterality: N/A;    Social History   Social History  . Marital Status: Single    Spouse Name: N/A  . Number of Children: 0  . Years of Education: 10   Occupational History  . Textiles    Social History Main Topics  . Smoking status: Never Smoker   . Smokeless tobacco: Never Used  . Alcohol Use: No  . Drug Use: No  . Sexual Activity: Not on file   Other Topics Concern  . Not on file   Social History Narrative   Regular Exercise-no    Current Outpatient Prescriptions on File Prior to Visit  Medication Sig Dispense Refill  . albuterol (PROAIR HFA) 108  (90 BASE) MCG/ACT inhaler Inhale 2 puffs into the lungs every 6 (six) hours as needed for wheezing or shortness of breath.    . ALPRAZolam (XANAX) 0.5 MG tablet Take 0.5 mg by mouth 2 (two) times daily as needed for anxiety.     . Calcium Carbonate-Vitamin D (CALTRATE 600+D) 600-400 MG-UNIT per tablet Take 1 tablet by mouth daily.      . captopril (CAPOTEN) 25 MG tablet Take 25 mg by mouth 2 (two) times daily.      . furosemide (LASIX) 20 MG tablet Take 1 tablet by mouth daily as needed.     Marland Kitchen glipiZIDE (GLUCOTROL XL) 5 MG 24 hr tablet Take 5 mg by mouth daily.      . hydroxypropyl methylcellulose (ISOPTO TEARS) 2.5 % ophthalmic solution Place 1 drop into both eyes daily as needed (Dry Eyes).    . metFORMIN (GLUCOPHAGE-XR) 500 MG 24 hr tablet Take 1,000 mg by mouth 2 (two) times daily.     . Multiple Vitamins-Minerals (CENTRUM SILVER PO) Take 1 tablet by mouth daily.      . pantoprazole (PROTONIX) 40 MG tablet TAKE 1 TABLET BY MOUTH EVERY DAY 30 tablet 5  . simvastatin (ZOCOR) 40 MG tablet Take 40 mg by mouth daily.      . vitamin C (ASCORBIC ACID)  500 MG tablet Take 500 mg by mouth daily.    . vitamin E 400 UNIT capsule Take 400 Units by mouth daily.     No current facility-administered medications on file prior to visit.    No Known Allergies  Family History  Problem Relation Age of Onset  . Cancer Other     Colon Cancer, Prostate Cancer-Other Blood Relative  . Hyperlipidemia Other     Other Blood Relative  . Stroke Other     Parent, Other Blood Relative  . Diabetes Other     Parent, Other Blood Relative  . Hypertension Other     Parent  . Prostate cancer Maternal Aunt   . Dementia Maternal Aunt   . Dementia Sister   . Dementia Sister   . Alzheimer's disease Sister   . Liver disease Brother     BP 122/80 mmHg  Pulse 70  Temp(Src) 97.5 F (36.4 C) (Oral)  Ht 5' 3"  (1.6 m)  Wt 237 lb (107.502 kg)  BMI 41.99 kg/m2  SpO2 97%  Review of Systems Denies sob and dysphagia     Objective:   Physical Exam VITAL SIGNS:  See vs page GENERAL: no distress NECK: There is no palpable thyroid enlargement.  No thyroid nodule is palpable.  No palpable lymphadenopathy at the anterior neck.   Lab Results  Component Value Date   TSH 2.42 04/07/2015      Assessment & Plan:  Hyperthyroidism, resolved with I-131 rx.  Patient is advised the following: Patient Instructions  A thyroid blood test is requested for you today.  We'll let you know about the results. You can skip the ultrasound again this year.   Please come back for a follow-up appointment in 1 year.  most of the time, a "lumpy thyroid" will eventually become overactive again.  this is usually a slow process, happening over the span of many years.

## 2015-04-23 DIAGNOSIS — E782 Mixed hyperlipidemia: Secondary | ICD-10-CM | POA: Diagnosis not present

## 2015-04-23 DIAGNOSIS — K219 Gastro-esophageal reflux disease without esophagitis: Secondary | ICD-10-CM | POA: Diagnosis not present

## 2015-04-23 DIAGNOSIS — E039 Hypothyroidism, unspecified: Secondary | ICD-10-CM | POA: Diagnosis not present

## 2015-04-23 DIAGNOSIS — I10 Essential (primary) hypertension: Secondary | ICD-10-CM | POA: Diagnosis not present

## 2015-04-23 DIAGNOSIS — E1165 Type 2 diabetes mellitus with hyperglycemia: Secondary | ICD-10-CM | POA: Diagnosis not present

## 2015-04-27 DIAGNOSIS — E1165 Type 2 diabetes mellitus with hyperglycemia: Secondary | ICD-10-CM | POA: Diagnosis not present

## 2015-04-27 DIAGNOSIS — E782 Mixed hyperlipidemia: Secondary | ICD-10-CM | POA: Diagnosis not present

## 2015-04-27 DIAGNOSIS — I5030 Unspecified diastolic (congestive) heart failure: Secondary | ICD-10-CM | POA: Diagnosis not present

## 2015-04-27 DIAGNOSIS — I10 Essential (primary) hypertension: Secondary | ICD-10-CM | POA: Diagnosis not present

## 2015-04-27 DIAGNOSIS — K219 Gastro-esophageal reflux disease without esophagitis: Secondary | ICD-10-CM | POA: Diagnosis not present

## 2015-04-27 DIAGNOSIS — K7460 Unspecified cirrhosis of liver: Secondary | ICD-10-CM | POA: Diagnosis not present

## 2015-04-27 DIAGNOSIS — E039 Hypothyroidism, unspecified: Secondary | ICD-10-CM | POA: Diagnosis not present

## 2015-04-27 DIAGNOSIS — D649 Anemia, unspecified: Secondary | ICD-10-CM | POA: Diagnosis not present

## 2015-04-27 DIAGNOSIS — F419 Anxiety disorder, unspecified: Secondary | ICD-10-CM | POA: Diagnosis not present

## 2015-04-27 DIAGNOSIS — D696 Thrombocytopenia, unspecified: Secondary | ICD-10-CM | POA: Diagnosis not present

## 2015-05-04 DIAGNOSIS — L570 Actinic keratosis: Secondary | ICD-10-CM | POA: Diagnosis not present

## 2015-05-27 ENCOUNTER — Other Ambulatory Visit (INDEPENDENT_AMBULATORY_CARE_PROVIDER_SITE_OTHER): Payer: Self-pay | Admitting: Internal Medicine

## 2015-05-27 DIAGNOSIS — M76822 Posterior tibial tendinitis, left leg: Secondary | ICD-10-CM | POA: Diagnosis not present

## 2015-05-27 DIAGNOSIS — M19072 Primary osteoarthritis, left ankle and foot: Secondary | ICD-10-CM | POA: Diagnosis not present

## 2015-05-27 DIAGNOSIS — M1712 Unilateral primary osteoarthritis, left knee: Secondary | ICD-10-CM | POA: Diagnosis not present

## 2015-06-03 DIAGNOSIS — M19072 Primary osteoarthritis, left ankle and foot: Secondary | ICD-10-CM | POA: Diagnosis not present

## 2015-06-08 DIAGNOSIS — D72819 Decreased white blood cell count, unspecified: Secondary | ICD-10-CM | POA: Diagnosis not present

## 2015-06-08 DIAGNOSIS — R160 Hepatomegaly, not elsewhere classified: Secondary | ICD-10-CM | POA: Diagnosis not present

## 2015-06-08 DIAGNOSIS — D696 Thrombocytopenia, unspecified: Secondary | ICD-10-CM | POA: Diagnosis not present

## 2015-06-08 DIAGNOSIS — D649 Anemia, unspecified: Secondary | ICD-10-CM | POA: Diagnosis not present

## 2015-06-08 DIAGNOSIS — Z853 Personal history of malignant neoplasm of breast: Secondary | ICD-10-CM | POA: Diagnosis not present

## 2015-06-11 ENCOUNTER — Telehealth (INDEPENDENT_AMBULATORY_CARE_PROVIDER_SITE_OTHER): Payer: Self-pay | Admitting: Internal Medicine

## 2015-06-11 NOTE — Telephone Encounter (Signed)
Patient called, has an appointment for 06/16/15 with Dr. Laural Golden, wants to know if she needs to have labs done prior to her appointment.  [phone]

## 2015-06-11 NOTE — Telephone Encounter (Signed)
No labs are noted. Dr.Rehman will make [place] aware if any are needed at the time of her OV. Patient was called and made aware.

## 2015-06-16 ENCOUNTER — Ambulatory Visit (INDEPENDENT_AMBULATORY_CARE_PROVIDER_SITE_OTHER): Payer: Medicare Other | Admitting: Internal Medicine

## 2015-06-16 ENCOUNTER — Encounter (INDEPENDENT_AMBULATORY_CARE_PROVIDER_SITE_OTHER): Payer: Self-pay | Admitting: Internal Medicine

## 2015-06-16 VITALS — BP 122/84 | HR 74 | Temp 98.3°F | Resp 18 | Ht 63.0 in | Wt 228.4 lb

## 2015-06-16 DIAGNOSIS — R772 Abnormality of alphafetoprotein: Secondary | ICD-10-CM | POA: Diagnosis not present

## 2015-06-16 DIAGNOSIS — K7460 Unspecified cirrhosis of liver: Secondary | ICD-10-CM

## 2015-06-16 DIAGNOSIS — K7689 Other specified diseases of liver: Secondary | ICD-10-CM

## 2015-06-16 DIAGNOSIS — R7989 Other specified abnormal findings of blood chemistry: Secondary | ICD-10-CM | POA: Diagnosis not present

## 2015-06-16 DIAGNOSIS — K769 Liver disease, unspecified: Secondary | ICD-10-CM

## 2015-06-16 NOTE — Patient Instructions (Addendum)
Physician will call with results of blood work when completed. MRI of liver to be scheduled for follow-up of previously identified abnormality.

## 2015-06-16 NOTE — Progress Notes (Signed)
Presenting complaint;  Follow-up for chronic liver disease.  Subjective:  Patient is 75 year old Caucasian female who was cirrhosis secondary to NASH complicated by esophageal varices which have been banded in the past and she's been maintained on nonselective beta blocker and she also small liver lesion which hasn't been fully corrector rice. She was last seen on 12/04/2014. She has no complaints. She has changed her eating habits and has managed to lose 11 pounds. She says she is not able to do much exercise on account of arthritis. She denies abdominal distention pain nausea vomiting melena or rectal bleeding.   Current Medications: Outpatient Encounter Prescriptions as of 06/16/2015  Medication Sig  . albuterol (PROAIR HFA) 108 (90 BASE) MCG/ACT inhaler Inhale 2 puffs into the lungs every 6 (six) hours as needed for wheezing or shortness of breath.  . ALPRAZolam (XANAX) 0.5 MG tablet Take 0.5 mg by mouth 2 (two) times daily as needed for anxiety.   . Calcium Carbonate-Vitamin D (CALTRATE 600+D) 600-400 MG-UNIT per tablet Take 1 tablet by mouth daily.    . captopril (CAPOTEN) 25 MG tablet Take 25 mg by mouth 2 (two) times daily.    . furosemide (LASIX) 20 MG tablet Take 1 tablet by mouth daily as needed.   Marland Kitchen glipiZIDE (GLUCOTROL XL) 5 MG 24 hr tablet Take 5 mg by mouth daily.    . hydroxypropyl methylcellulose (ISOPTO TEARS) 2.5 % ophthalmic solution Place 1 drop into both eyes daily as needed (Dry Eyes).  . metFORMIN (GLUCOPHAGE-XR) 500 MG 24 hr tablet Take 1,000 mg by mouth 2 (two) times daily.   . Multiple Vitamins-Minerals (CENTRUM SILVER PO) Take 1 tablet by mouth daily.    . pantoprazole (PROTONIX) 40 MG tablet TAKE 1 TABLET BY MOUTH EVERY DAY  . propranolol (INDERAL) 20 MG tablet Take 20 mg by mouth 2 (two) times daily.   . simvastatin (ZOCOR) 40 MG tablet Take 40 mg by mouth daily.    . vitamin C (ASCORBIC ACID) 500 MG tablet Take 500 mg by mouth daily.  . vitamin E 400 UNIT  capsule Take 400 Units by mouth daily.   No facility-administered encounter medications on file as of 06/16/2015.     Objective: Blood pressure 122/84, pulse 74, temperature 98.3 F (36.8 C), temperature source Oral, resp. rate 18, height 5' 3"  (1.6 m), weight 228 lb 6.4 oz (103.602 kg). Patient is alert and does not have asterixis. Conjunctiva is pink. Sclera is nonicteric Oropharyngeal mucosa is normal. No neck masses or thyromegaly noted. Cardiac exam with regular rhythm normal S1 and S2. No murmur or gallop noted. Lungs are clear to auscultation. Abdomen is obese but soft and nontender without organomegaly or masses.  No LE edema or clubbing noted.  Labs/studies Results: AFP 13.3 on 05/28/2014  AFP 12.2 on 12/24/2014    Assessment:  #1. 11 mm liver lesion in segment 4A with enhancing corrector wrist 6 on MRI of 01/05/2015. This lesion could not be found on ultrasound consider biopsy. She has mildly elevated AFP most likely due to cirrhosis. It has been trending downwards. #2. Cirrhosis secondary to NASH complicated by esophageal varices which were last banded in October 2014. She is on nonselective beta blocker for primary prophylaxis. She needs to be reevaluated and varices banded if indicated.   Plan:  Patient will go to the lab for AFP. Will schedule her MRI of liver to follow-up on hepatic lesion. Once above completed she will undergo EGD with variceal banding. Office visit in 30  months.

## 2015-06-17 DIAGNOSIS — M1712 Unilateral primary osteoarthritis, left knee: Secondary | ICD-10-CM | POA: Diagnosis not present

## 2015-06-17 DIAGNOSIS — M25562 Pain in left knee: Secondary | ICD-10-CM | POA: Diagnosis not present

## 2015-06-17 LAB — AFP TUMOR MARKER: AFP-Tumor Marker: 22.7 ng/mL — ABNORMAL HIGH

## 2015-06-22 ENCOUNTER — Other Ambulatory Visit (INDEPENDENT_AMBULATORY_CARE_PROVIDER_SITE_OTHER): Payer: Self-pay | Admitting: Internal Medicine

## 2015-06-22 DIAGNOSIS — K769 Liver disease, unspecified: Secondary | ICD-10-CM

## 2015-06-22 DIAGNOSIS — K7469 Other cirrhosis of liver: Secondary | ICD-10-CM

## 2015-06-22 DIAGNOSIS — R772 Abnormality of alphafetoprotein: Secondary | ICD-10-CM

## 2015-06-23 ENCOUNTER — Telehealth (INDEPENDENT_AMBULATORY_CARE_PROVIDER_SITE_OTHER): Payer: Self-pay | Admitting: Internal Medicine

## 2015-06-23 NOTE — Telephone Encounter (Signed)
Patient called, left a message that Dr. Laural Golden called and left her a message regarding her bloodwork and she didn't understand the message.  She would like a call back.  [phone]

## 2015-06-23 NOTE — Telephone Encounter (Signed)
Dr.Rehman was made aware. He will call the patient again.

## 2015-06-30 ENCOUNTER — Ambulatory Visit (HOSPITAL_COMMUNITY)
Admission: RE | Admit: 2015-06-30 | Discharge: 2015-06-30 | Disposition: A | Discharge status: Home / Self Care | Payer: Medicare Other | Source: Ambulatory Visit | Attending: Internal Medicine | Admitting: Internal Medicine

## 2015-06-30 DIAGNOSIS — J90 Pleural effusion, not elsewhere classified: Secondary | ICD-10-CM | POA: Insufficient documentation

## 2015-06-30 DIAGNOSIS — R772 Abnormality of alphafetoprotein: Secondary | ICD-10-CM | POA: Insufficient documentation

## 2015-06-30 DIAGNOSIS — K7689 Other specified diseases of liver: Secondary | ICD-10-CM | POA: Diagnosis not present

## 2015-06-30 DIAGNOSIS — K7469 Other cirrhosis of liver: Secondary | ICD-10-CM | POA: Insufficient documentation

## 2015-06-30 DIAGNOSIS — K7460 Unspecified cirrhosis of liver: Secondary | ICD-10-CM | POA: Diagnosis not present

## 2015-06-30 DIAGNOSIS — K766 Portal hypertension: Secondary | ICD-10-CM | POA: Insufficient documentation

## 2015-06-30 DIAGNOSIS — K769 Liver disease, unspecified: Secondary | ICD-10-CM

## 2015-06-30 DIAGNOSIS — R188 Other ascites: Secondary | ICD-10-CM | POA: Insufficient documentation

## 2015-06-30 LAB — CREATININE, SERUM: Creatinine, Ser: 0.67 mg/dL (ref 0.44–1.00)

## 2015-06-30 MED ORDER — GADOXETATE DISODIUM 0.25 MMOL/ML IV SOLN
9.0000 mL | Freq: Once | INTRAVENOUS | Status: AC | PRN
  Administered 2015-06-30: 9 mL via INTRAVENOUS

## 2015-07-08 NOTE — Progress Notes (Signed)
Patient was given an appointment for 07/16/15 at 4:00pm

## 2015-07-16 ENCOUNTER — Ambulatory Visit (INDEPENDENT_AMBULATORY_CARE_PROVIDER_SITE_OTHER): Payer: Medicare Other | Admitting: Internal Medicine

## 2015-07-16 ENCOUNTER — Encounter (INDEPENDENT_AMBULATORY_CARE_PROVIDER_SITE_OTHER): Payer: Self-pay | Admitting: Internal Medicine

## 2015-07-16 VITALS — BP 110/74 | HR 72 | Temp 98.2°F | Resp 18 | Ht 63.0 in | Wt 232.1 lb

## 2015-07-16 DIAGNOSIS — K7460 Unspecified cirrhosis of liver: Secondary | ICD-10-CM | POA: Diagnosis not present

## 2015-07-16 DIAGNOSIS — K7689 Other specified diseases of liver: Secondary | ICD-10-CM

## 2015-07-16 DIAGNOSIS — K769 Liver disease, unspecified: Secondary | ICD-10-CM

## 2015-07-16 NOTE — Patient Instructions (Signed)
AFP(blood test) to be done on 09/15/2015.

## 2015-07-16 NOTE — Progress Notes (Signed)
Presenting complaint;  Patient is fair to discuss results of recent MRI and next step in her management.  Database:  Patient is 75 year old Caucasian female who was cirrhosis secondary to an was last seen on 06/16/2015. Her AFP was noted to have almost doubled. She also has history of small lesion in segment 4A of the liver not meeting criterion for [facility]. Therefore MR was repeated and reveals 22 x 16 cm arterial enhancing lesion with delayed hypoenhancement. This lesion does not meet criterion for [place].  Subjective:  Patient has no complaints. She denies abdominal pain nausea vomiting weakness or pruritus. She had scant 4 pounds since her last visit. She is just not able to walk or exercise. She is on propranolol and not having any side effects. She is accompanied by her niece Vaughan Basta.    Current Medications: Outpatient Encounter Prescriptions as of 07/16/2015  Medication Sig  . albuterol (PROAIR HFA) 108 (90 BASE) MCG/ACT inhaler Inhale 2 puffs into the lungs every 6 (six) hours as needed for wheezing or shortness of breath.  . ALPRAZolam (XANAX) 0.5 MG tablet Take 0.5 mg by mouth 2 (two) times daily as needed for anxiety.   . Calcium Carbonate-Vitamin D (CALTRATE 600+D) 600-400 MG-UNIT per tablet Take 1 tablet by mouth daily.    . captopril (CAPOTEN) 25 MG tablet Take 25 mg by mouth 2 (two) times daily.    . furosemide (LASIX) 20 MG tablet Take 1 tablet by mouth daily as needed.   Marland Kitchen glipiZIDE (GLUCOTROL XL) 5 MG 24 hr tablet Take 5 mg by mouth daily.    . hydroxypropyl methylcellulose (ISOPTO TEARS) 2.5 % ophthalmic solution Place 1 drop into both eyes daily as needed (Dry Eyes).  . metFORMIN (GLUCOPHAGE-XR) 500 MG 24 hr tablet Take 1,000 mg by mouth 2 (two) times daily.   . Multiple Vitamins-Minerals (CENTRUM SILVER PO) Take 1 tablet by mouth daily.    . pantoprazole (PROTONIX) 40 MG tablet TAKE 1 TABLET BY MOUTH EVERY DAY  . propranolol (INDERAL) 20 MG tablet Take 20 mg by mouth 2 (two)  times daily.   . simvastatin (ZOCOR) 40 MG tablet Take 40 mg by mouth daily.    . vitamin C (ASCORBIC ACID) 500 MG tablet Take 500 mg by mouth daily.  . vitamin E 400 UNIT capsule Take 400 Units by mouth daily.   No facility-administered encounter medications on file as of 07/16/2015.     Objective: Blood pressure 110/74, pulse 72, temperature 98.2 F (36.8 C), temperature source Oral, resp. rate 18, height 5' 3"  (1.6 m), weight 232 lb 1.6 oz (105.28 kg). Patient is alert and in no acute distress.  Labs/studies Results:  AFP 22.7 on 06/16/2015  AFP 12.2 on 12/24/2014    MR abdomen without and with contrast on 06/30/2015 There is 22 x 16 mm hyperenhancing lesion in segment 4A. On prior study of 01/05/2015 this lesion measured 17 x 13 mm. There is subtle late central hypo-and enhancement. Echogenic cirrhotic liver splenomegaly and gallstones. Small amount of ascites and right renal cysts.  MR images were reviewed with patient and her niece Vaughan Basta.  Assessment:  #1. Slowly enlarging hepatic lesion in segment 4A. This lesion does not meet MR criterion for HCC. Nevertheless [place] is high on the list of differential diagnosis. Patient is asymptomatic and not sure if she wants to proceed with biopsy to confirm diagnosis before treatment options considered. #2. Cirrhosis secondary to NASH. Patient has been unable to lose weight. So far she has well  preserved hepatic function.   Plan:  Patient will have AFP on 09/15/2015 admission, determine whether or not she should proceed with biopsy and ablation at the same time. Office visit in 6 months.

## 2015-08-26 ENCOUNTER — Other Ambulatory Visit (INDEPENDENT_AMBULATORY_CARE_PROVIDER_SITE_OTHER): Payer: Self-pay | Admitting: Internal Medicine

## 2015-09-07 DIAGNOSIS — C44311 Basal cell carcinoma of skin of nose: Secondary | ICD-10-CM | POA: Diagnosis not present

## 2015-09-07 DIAGNOSIS — D485 Neoplasm of uncertain behavior of skin: Secondary | ICD-10-CM | POA: Diagnosis not present

## 2015-09-07 DIAGNOSIS — Z85828 Personal history of other malignant neoplasm of skin: Secondary | ICD-10-CM | POA: Diagnosis not present

## 2015-09-07 DIAGNOSIS — L570 Actinic keratosis: Secondary | ICD-10-CM | POA: Diagnosis not present

## 2015-09-09 ENCOUNTER — Encounter (HOSPITAL_COMMUNITY): Payer: Medicare Other | Attending: Hematology & Oncology | Admitting: Hematology & Oncology

## 2015-09-09 ENCOUNTER — Encounter (HOSPITAL_COMMUNITY): Payer: Medicare Other

## 2015-09-09 ENCOUNTER — Encounter (HOSPITAL_COMMUNITY): Payer: Self-pay | Admitting: Hematology & Oncology

## 2015-09-09 VITALS — BP 138/58 | HR 75 | Temp 98.1°F | Resp 20 | Ht 61.0 in | Wt 240.1 lb

## 2015-09-09 DIAGNOSIS — D696 Thrombocytopenia, unspecified: Secondary | ICD-10-CM

## 2015-09-09 DIAGNOSIS — Z853 Personal history of malignant neoplasm of breast: Secondary | ICD-10-CM | POA: Diagnosis not present

## 2015-09-09 DIAGNOSIS — K7469 Other cirrhosis of liver: Secondary | ICD-10-CM | POA: Diagnosis not present

## 2015-09-09 DIAGNOSIS — E538 Deficiency of other specified B group vitamins: Secondary | ICD-10-CM

## 2015-09-09 DIAGNOSIS — C50411 Malignant neoplasm of upper-outer quadrant of right female breast: Secondary | ICD-10-CM

## 2015-09-09 DIAGNOSIS — D500 Iron deficiency anemia secondary to blood loss (chronic): Secondary | ICD-10-CM

## 2015-09-09 DIAGNOSIS — D72819 Decreased white blood cell count, unspecified: Secondary | ICD-10-CM | POA: Diagnosis not present

## 2015-09-09 LAB — COMPREHENSIVE METABOLIC PANEL
ALBUMIN: 3.5 g/dL (ref 3.5–5.0)
ALK PHOS: 72 U/L (ref 38–126)
ALT: 25 U/L (ref 14–54)
AST: 35 U/L (ref 15–41)
Anion gap: 8 (ref 5–15)
BUN: 8 mg/dL (ref 6–20)
CALCIUM: 8.5 mg/dL — AB (ref 8.9–10.3)
CHLORIDE: 103 mmol/L (ref 101–111)
CO2: 25 mmol/L (ref 22–32)
CREATININE: 0.75 mg/dL (ref 0.44–1.00)
GFR calc Af Amer: >60 mL/min (ref >60)
GFR calc non Af Amer: >60 mL/min (ref >60)
GLUCOSE: 341 mg/dL — AB (ref 65–99)
Potassium: 4.3 mmol/L (ref 3.5–5.1)
SODIUM: 136 mmol/L (ref 135–145)
Total Bilirubin: 1.1 mg/dL (ref 0.3–1.2)
Total Protein: 7.2 g/dL (ref 6.5–8.1)

## 2015-09-09 LAB — CBC WITH DIFFERENTIAL/PLATELET
BASOS ABS: 0 10*3/uL (ref 0.0–0.1)
Basophils Relative: 0 %
EOS ABS: 0.1 10*3/uL (ref 0.0–0.7)
Eosinophils Relative: 2 %
HCT: 35.5 % — ABNORMAL LOW (ref 36.0–46.0)
HEMOGLOBIN: 12.2 g/dL (ref 12.0–15.0)
Lymphocytes Relative: 27 %
Lymphs Abs: 0.9 10*3/uL (ref 0.7–4.0)
MCH: 31 pg (ref 26.0–34.0)
MCHC: 34.4 g/dL (ref 30.0–36.0)
MCV: 90.1 fL (ref 78.0–100.0)
Monocytes Absolute: 0.4 10*3/uL (ref 0.1–1.0)
Monocytes Relative: 11 %
NEUTROS PCT: 61 %
Neutro Abs: 2 10*3/uL (ref 1.7–7.7)
PLATELETS: 69 10*3/uL — AB (ref 150–400)
RBC: 3.94 MIL/uL (ref 3.87–5.11)
RDW: 14.3 % (ref 11.5–15.5)
WBC: 3.3 10*3/uL — AB (ref 4.0–10.5)

## 2015-09-09 LAB — VITAMIN B12: VITAMIN B 12: 449 pg/mL (ref 180–914)

## 2015-09-09 LAB — FOLATE: Folate: 31.3 ng/mL (ref >5.9)

## 2015-09-09 LAB — FERRITIN: Ferritin: 33 ng/mL (ref 11–307)

## 2015-09-09 NOTE — Progress Notes (Signed)
[place]  CONSULT NOTE  Patient Care Team: [name], MD as PCP - General (Unknown Physician Specialty)  CHIEF COMPLAINTS/PURPOSE OF CONSULTATION:  Hx of Stage I carcinoma of the UOQ of the Right breast Breast cancer treated with surgery, XRT and 5 years arimidex Hx of iron deficiency anemia Hx of cirrhosis with thrombocytopenia and leukopenia, followed by Dr. Laural Golden    Breast cancer of upper-outer quadrant of right female breast ([place])   12/12/2003 Initial Biopsy    T1b tumor, ER+ greater than 90%, PR + 20%, HER 2/neu negative.      12/19/2003 Surgery    Lumpectomy, sentinel node. No metastatic carcinoma in 4 LN. No residual cancer in the biopsy specimen      01/06/2004 - 01/06/2009 Anti-estrogen oral therapy    Arimidex X 5 years      01/19/2004 - 03/22/2004 Radiation Therapy    5040 cGY in 28 fractions delivered with boost to tumor bed totaling 6040 cGY        HISTORY OF PRESENTING ILLNESS:  Cheryl Velasquez 75 y.o. female is here to establish ongoing care for a history of stage I breast cancer, leukopenia and thrombocytopenia from cirrhosis and splenomegaly.  Cheryl Velasquez is unaccompanied and uses a cane "to help steady me".   She receives annual mammograms around the first of the year, usually February, at [facility]. She performs self-breast exams. She does not believe she has had a bone density scan since Dr. Tressie Stalker sent her for one back when she was taking arimidex. She takes calcium and Vitamin D supplements. She took her breast cancer pill for 5 years. She no longer follows with a gynecologist.  Her surgeon was Dr. Anthony Sar and performed breast surgery in 2005 after a hernia surgery in 2004. She received radiation treatment with Dr. Sondra Come. She worked throughout treatment.  She sees Dr. Laural Golden for her liver. She received a colonoscopy with Dr. Anthony Sar many years ago. She has "had a light down her throat" with Dr. Laural Golden a couple of times. She did  not have any bleeding in her throat.  The last time her iron was checked it was alright. She has received B12 shots before. When asked when she received the last B12 shot, patient states "It has been a while". She last saw Dr. Tressie Stalker in December 2016.   She recently had a lesion of skin cancer removed from her nose. There is a band-aid in place. She is not sure what type of skin cancer it was.   She sees Dr. Carlyle Dolly in [place] for cardiology. She sees him again in September 2017.  She lives alone. She is able to mow her yard most of the time. She helps take care of her sister who has Alzheimer's and Parkinson's disease. Her appetite is good, "If I cook". She enjoys cooking breakfast and making a sandwich. She attends church groups when she has the time, but she mostly helps take care of her sister.   She denies issues with arm swelling. She denies blood in stool. She denies chest pain or trouble breathing. She denies abdominal pain. She has no complaints or concerns at this time.   She reports she gets a little light-headed at times. She fell once when she tripped over a bump in the parking lot.   She becomes anxious while undergoing MRI scans.   The patient is here to establish care in the setting of closure of [facility].    MEDICAL HISTORY:  Past Medical History:  Diagnosis Date  . Arthritis   . Cirrhosis ([place])   . Diabetes mellitus   . History of breast cancer   . History of chicken pox   . Hyperlipidemia   . Hypertension   . Thyroid disease     SURGICAL HISTORY: Past Surgical History:  Procedure Laterality Date  . ABDOMINAL HYSTERECTOMY  1987  . BREAST BIOPSY  2005  . ESOPHAGEAL BANDING N/A 12/13/2012   Procedure: ESOPHAGEAL BANDING;  Surgeon: Rogene Houston, MD;  [facility];  Service: Endoscopy;  Laterality: N/A;  . ESOPHAGOGASTRODUODENOSCOPY N/A 09/20/2012   Procedure: ESOPHAGOGASTRODUODENOSCOPY (EGD);  Surgeon: Rogene Houston, MD;  Location: [facility]  SU[facility];  Service: Endoscopy;  Laterality: N/A;  125-moved to 12:00 Ann notified pt  . ESOPHAGOGASTRODUODENOSCOPY N/A 12/13/2012   Procedure: ESOPHAGOGASTRODUODENOSCOPY (EGD);  Surgeon: Rogene Houston, MD;  Location: [facility];  Service: Endoscopy;  Laterality: N/A;  155-moved to 220 Ann to notify pt  . HERNIA REPAIR  2004    SOCIAL HISTORY: Social History   Social History  . Marital status: Single    Spouse name: N/A  . Number of children: 0  . Years of education: 10   Occupational History  . Textiles    Social History Main Topics  . Smoking status: Never Smoker  . Smokeless tobacco: Never Used  . Alcohol use No  . Drug use: No  . Sexual activity: No   Other Topics Concern  . Not on file   Social History Narrative   Regular Exercise-no   Single She lives by herself. 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 Dementia Sister   . Dementia Sister   . Alzheimer's disease Sister   . Liver disease Brother   . Cancer Other     Colon Cancer, Prostate Cancer-Other Blood Relative  . Hyperlipidemia Other     Other Blood Relative  . Stroke Other     Parent, Other Blood Relative  . Diabetes Other     Parent, Other Blood Relative  . Hypertension Other     Parent  . Prostate cancer Maternal Aunt   . Dementia Maternal Aunt    Mother deceased in her 32s of congestive heart failure Father deceased in his 72s of a heart attack. He was a smoker and ate what he liked, "grease and coffee". 8 siblings total.  1 sister living who has Alzheimer's and Parkinson's disease No family history of breast cancer she is aware of  ALLERGIES:  has No Known Allergies.  MEDICATIONS:  Current Outpatient Prescriptions  Medication Sig Dispense Refill  . albuterol (PROAIR HFA)  108 (90 BASE) MCG/ACT inhaler Inhale 2 puffs into the lungs every 6 (six) hours as needed for wheezing or shortness of breath.    . ALPRAZolam (XANAX) 0.5 MG tablet Take 0.5 mg by mouth 2 (two) times daily as needed for anxiety.     . Calcium Carbonate-Vitamin D (CALTRATE 600+D) 600-400 MG-UNIT per tablet Take 1 tablet by mouth daily.      . captopril (CAPOTEN) 25 MG tablet Take 25 mg by mouth 2 (two) times daily.      . fluconazole (DIFLUCAN) 150 MG tablet     . furosemide (LASIX) 20  MG tablet Take 1 tablet by mouth daily as needed.     Marland Kitchen glipiZIDE (GLUCOTROL XL) 5 MG 24 hr tablet Take 5 mg by mouth daily.      . hydroxypropyl methylcellulose (ISOPTO TEARS) 2.5 % ophthalmic solution Place 1 drop into both eyes daily as needed (Dry Eyes).    . metFORMIN (GLUCOPHAGE-XR) 500 MG 24 hr tablet Take 1,000 mg by mouth 2 (two) times daily.     . Multiple Vitamins-Minerals (CENTRUM SILVER PO) Take 1 tablet by mouth daily.      . pantoprazole (PROTONIX) 40 MG tablet TAKE 1 TABLET BY MOUTH EVERY DAY 30 tablet 5  . propranolol (INDERAL) 20 MG tablet TAKE 1 TABLET BY MOUTH TWICE DAILY. TO REPLACE NADOLOL IN JANUARY 2017 DUE TO INSURANCE FORMULARY. 60 tablet 5  . simvastatin (ZOCOR) 40 MG tablet Take 40 mg by mouth daily.      . vitamin C (ASCORBIC ACID) 500 MG tablet Take 500 mg by mouth daily.    . vitamin E 400 UNIT capsule Take 400 Units by mouth daily.     No current facility-administered medications for this visit.     Review of Systems  Constitutional: Negative.   HENT: Negative.   Eyes: Negative.   Respiratory: Negative.  Negative for shortness of breath.   Cardiovascular: Negative.  Negative for chest pain.  Gastrointestinal: Negative.  Negative for blood in stool.  Genitourinary: Negative.   Musculoskeletal: Negative.   Skin: Negative.   Neurological: Positive for dizziness.       Intermittent lightheadedness  Endo/Heme/Allergies: Negative.   Psychiatric/Behavioral: Negative.   All other  systems reviewed and are negative. 14 point ROS was done and is otherwise as detailed above or in HPI   PHYSICAL EXAMINATION: ECOG PERFORMANCE STATUS: 1 - Symptomatic but completely ambulatory  Vitals:   09/09/15 1400  BP: (!) 138/58  Pulse: 75  Resp: 20  Temp: 98.1 F (36.7 C)   Filed Weights   09/09/15 1400  Weight: 240 lb 1.6 oz (108.9 kg)    Physical Exam  Constitutional: She is oriented to person, place, and time and well-developed, well-nourished, and in no distress.  Obese  HENT:  Head: Normocephalic and atraumatic.  Nose: Nose normal.  Mouth/Throat: Oropharynx is clear and moist. No oropharyngeal exudate.  Band-aid on nose after having a skin cancer lesion removed  Eyes: Conjunctivae and EOM are normal. Pupils are equal, round, and reactive to light. Right eye exhibits no discharge. Left eye exhibits no discharge. No scleral icterus.  Neck: Normal range of motion. Neck supple. No tracheal deviation present. No thyromegaly present.  Cardiovascular: Normal rate, regular rhythm and normal heart sounds.  Exam reveals no gallop and no friction rub.   No murmur heard. Pulmonary/Chest: Effort normal and breath sounds normal. She has no wheezes. She has no rales.    Abdominal: Soft. Bowel sounds are normal. She exhibits no distension and no mass. There is no tenderness. There is no rebound and no guarding.  Musculoskeletal: Normal range of motion. She exhibits no edema.  Lymphadenopathy:    She has no cervical adenopathy.    She has no axillary adenopathy.       Right: No supraclavicular adenopathy present.       Left: No supraclavicular adenopathy present.  Neurological: She is alert and oriented to person, place, and time. She has normal reflexes. No cranial nerve deficit. Gait normal. Coordination normal.  Skin: Skin is warm and dry. No rash noted.  Psychiatric:  Mood, memory, affect and judgment normal.  Nursing note and vitals reviewed.   LABORATORY DATA:  I have  reviewed the data as listed Lab Results  Component Value Date   WBC 2.2 (L) 12/24/2014   HGB 12.6 12/24/2014   HCT 38.5 12/24/2014   MCV 93.0 12/24/2014   PLT 70 (L) 12/24/2014   CMP     Component Value Date/Time   CREATININE 0.67 06/30/2015 1352   PROT 6.6 12/24/2014 1126   ALBUMIN 3.3 (L) 12/24/2014 1126   AST 31 12/24/2014 1126   ALT 19 12/24/2014 1126   ALKPHOS 65 12/24/2014 1126   BILITOT 1.1 12/24/2014 1126   GFRNONAA >60 06/30/2015 1352   GFRAA >60 06/30/2015 1352       RADIOGRAPHIC STUDIES: I have personally reviewed the radiological images as listed and agreed with the findings in the report. CLINICAL DATA:  Cirrhosis. Elevated AFP. Follow-up of hepatic lesion.  EXAM: MRI ABDOMEN WITHOUT AND WITH CONTRAST  TECHNIQUE: Multiplanar multisequence MR imaging of the abdomen was performed both before and after the administration of intravenous contrast.  CONTRAST:  9.0 ml Eovist, a mixed extracellular and hepatocyte specific contrast agent.  COMPARISON:  01/05/2015.  FINDINGS: Portions of exam are mildly motion degraded.  Lower chest: Mild cardiomegaly. Small left pleural effusion is similar.  Hepatobiliary: Cirrhosis. Segment 4A liver lesion is again identified. This demonstrates arterial hyper enhancement, measuring 2.2 x 1.6 cm on image 14/ series 1001. When remeasured at the same level on the prior exam, 1.7 x 1.3 cm. Subtle late central hypoenhancement suggested on image 14/ series 103. Hypoenhancing on hepatobiliary phase imaging. Restricted diffusion, including on image 87/series 15.  No new liver lesions. Tiny gallstone or stones, more apparent on the prior exam.  Pancreas: Mild pancreatic atrophy, without duct dilatation.  Spleen: Splenomegaly, 14.9 cm craniocaudal.  Adrenals/Urinary Tract: Normal adrenal glands. Right renal lesions involving the lower pole. These are too small to characterize. More medial lesion demonstrates a 7  mm focus of T1 hyperintensity on image 44/ series 1000 and is likely a complex cyst. Normal left kidney, without hydronephrosis.  Stomach/Bowel: Normal stomach and abdominal bowel loops.  Vascular/Lymphatic: Separate origins of the splenic and common hepatic arteries. No splenic artery aneurysm. Normal aortic caliber. Patent hepatic and portal veins. Portal venous hypertension, including a recannulized paraumbilical vein.  Other: Similar small volume perihepatic ascites.  Musculoskeletal: No acute osseous abnormality.  IMPRESSION: 1. Segment 4A liver lesion demonstrates mild interval enlargement and is suspicious for a hepatocellular carcinoma. Given equivocal delayed hypo enhancement, this lesion is not diagnostic of hepatocellular carcinoma by consensus criteria. Biopsy should be considered. 2. Cirrhosis and portal venous hypertension. 3. Small volume ascites with similar left pleural effusion. References: Bruix Maury Dus; American Association for the Study of Liver Diseases. Management of hepatocellular carcinoma: an update. Hepatology 2011;53:1020-1022, and [place] for Liver Transplant Allocation: Standardization of Liver Imaging, Diagnosis, Classification, and Reporting of HepatocellularsecondaryCarcinoma1 Radiology: Volume 266: Number 2-February 2013   Electronically Signed   By: Abigail Miyamoto M.D.   On: 07/01/2015 09:25    ASSESSMENT & PLAN:  Hx of Stage I carcinoma of the UOQ of the Right breast Hx of iron deficiency anemia Hx of B12 deficiency Cirrhosis secondary to NASH with thrombocytopenia and leukopenia Liver lesion followed by Dr. Laural Golden Elevated AFP Esophageal varices Portal gastropathy  In regards to her breast cancer she continues to obtain yearly mammograms. She does these at [facility]. Breast exam today was performed and without  obvious recurrence. She will need ongoing yearly follow-up.  The patient will have labs drawn after  our visit today, including B12 and iron. We will call with these results. We will continue to follow her counts.   She will continue to follow with Dr. Laural Golden for her liver disease and liver mass.   She will return for follow up in 6 months.  Orders Placed This Encounter  Procedures  . CBC with Differential    Standing Status:   Future    Number of Occurrences:   1    Standing Expiration Date:   09/09/2016  . Comprehensive metabolic panel    Standing Status:   Future    Number of Occurrences:   1    Standing Expiration Date:   09/09/2016  . Ferritin    Standing Status:   Future    Number of Occurrences:   1    Standing Expiration Date:   09/09/2016  . Vitamin B12    Standing Status:   Future    Number of Occurrences:   1    Standing Expiration Date:   09/09/2016  . Folate    Standing Status:   Future    Number of Occurrences:   1    Standing Expiration Date:   09/09/2016    All questions were answered. The patient knows to call the clinic with any problems, questions or concerns.  This document serves as a record of services personally performed by Ancil Linsey, MD. It was created on her behalf by Arlyce Harman, a trained medical scribe. The creation of this record is based on the scribe's personal observations and the provider's statements to them. This document has been checked and approved by the attending provider.  I have reviewed the above documentation for accuracy and completeness, and I agree with the above.  This note was electronically signed.    Molli Hazard, MD  09/09/2015 3:04 PM

## 2015-09-09 NOTE — Patient Instructions (Signed)
[place] at [facility] Discharge Instructions  RECOMMENDATIONS MADE BY THE CONSULTANT AND ANY TEST RESULTS WILL BE SENT TO YOUR REFERRING PHYSICIAN.  You saw Dr. Whitney Muse today. Labs drawn today Return in 6 months for labs and follow up.  Thank you for choosing [place] at [facility] to provide your oncology and hematology care.  To afford each patient quality time with our provider, please arrive at least 15 minutes before your scheduled appointment time.   Beginning January 23rd 2017 lab work for the [facility] will be done in the  Main lab at [facility] on 1st floor. If you have a lab appointment with the [facility] please come in thru the  Main Entrance and check in at the main information desk  You need to re-schedule your appointment should you arrive 10 or more minutes late.  We strive to give you quality time with our providers, and arriving late affects you and other patients whose appointments are after yours.  Also, if you no show three or more times for appointments you may be dismissed from the clinic at the providers discretion.     Again, thank you for choosing [facility].  Our hope is that these requests will decrease the amount of time that you wait before being seen by our physicians.       _____________________________________________________________  Should you have questions after your visit to [facility], please contact our office at [phone] between the hours of 8:30 a.m. and 4:30 p.m.  Voicemails left after 4:30 p.m. will not be returned until the following business day.  For prescription refill requests, have your pharmacy contact our office.         Resources For Cancer Patients and their Caregivers ? American Cancer Society: Can assist with transportation, wigs, general needs, runs Look Good Feel Better.        ([phone] ? Cancer Care: Provides financial  assistance, online support groups, medication/co-pay assistance.  1-800-813-HOPE 678-702-6552) ? [place] Assists [place] cancer patients and their families through emotional , educational and financial support.  904 579 5082 ? [place] DSS Where to apply for food stamps, Medicaid and utility assistance. (312) 783-0138 ? RCATS: Transportation to medical appointments. 647-859-9696 ? Social Security Administration: May apply for disability if have a Stage IV cancer. 580-220-2603 574-070-1963 ? [employer], Disability and Transit Services: Assists with nutrition, care and transit needs. [place] Support Programs: @10RELATIVEDAYS @ > Cancer Support Group  2nd Tuesday of the month 1pm-2pm, Journey Room  > Creative Journey  3rd Tuesday of the month 1130am-1pm, Journey Room  > Look Good Feel Better  1st Wednesday of the month 10am-12 noon, Journey Room (Call [facility] to register [phone])

## 2015-09-14 ENCOUNTER — Other Ambulatory Visit (INDEPENDENT_AMBULATORY_CARE_PROVIDER_SITE_OTHER): Payer: Self-pay | Admitting: *Deleted

## 2015-09-14 DIAGNOSIS — K769 Liver disease, unspecified: Secondary | ICD-10-CM

## 2015-09-14 DIAGNOSIS — K7689 Other specified diseases of liver: Secondary | ICD-10-CM | POA: Diagnosis not present

## 2015-09-14 DIAGNOSIS — K7460 Unspecified cirrhosis of liver: Secondary | ICD-10-CM | POA: Diagnosis not present

## 2015-09-16 LAB — AFP TUMOR MARKER: AFP-Tumor Marker: 22 ng/mL — ABNORMAL HIGH (ref <6.1)

## 2015-09-21 ENCOUNTER — Other Ambulatory Visit (INDEPENDENT_AMBULATORY_CARE_PROVIDER_SITE_OTHER): Payer: Self-pay | Admitting: *Deleted

## 2015-09-21 DIAGNOSIS — K769 Liver disease, unspecified: Secondary | ICD-10-CM

## 2015-09-21 DIAGNOSIS — K7460 Unspecified cirrhosis of liver: Secondary | ICD-10-CM

## 2015-09-28 ENCOUNTER — Telehealth (HOSPITAL_COMMUNITY): Payer: Self-pay | Admitting: *Deleted

## 2015-09-28 NOTE — Telephone Encounter (Signed)
After reviewing with PA, called patient to let her know labs had not changed. She verbalized understanding.

## 2015-10-01 DIAGNOSIS — C44311 Basal cell carcinoma of skin of nose: Secondary | ICD-10-CM | POA: Diagnosis not present

## 2015-10-02 ENCOUNTER — Encounter (HOSPITAL_COMMUNITY): Payer: Self-pay | Admitting: Hematology & Oncology

## 2015-10-02 DIAGNOSIS — C50411 Malignant neoplasm of upper-outer quadrant of right female breast: Secondary | ICD-10-CM | POA: Insufficient documentation

## 2015-10-04 ENCOUNTER — Other Ambulatory Visit (HOSPITAL_COMMUNITY): Payer: Self-pay | Admitting: Hematology & Oncology

## 2015-10-04 DIAGNOSIS — D500 Iron deficiency anemia secondary to blood loss (chronic): Secondary | ICD-10-CM

## 2015-10-05 ENCOUNTER — Other Ambulatory Visit (HOSPITAL_COMMUNITY): Payer: Self-pay

## 2015-10-05 DIAGNOSIS — D500 Iron deficiency anemia secondary to blood loss (chronic): Secondary | ICD-10-CM

## 2015-10-06 ENCOUNTER — Other Ambulatory Visit (HOSPITAL_COMMUNITY): Payer: Self-pay

## 2015-10-09 ENCOUNTER — Other Ambulatory Visit: Payer: Self-pay

## 2015-10-13 ENCOUNTER — Encounter (HOSPITAL_COMMUNITY): Payer: Medicare Other | Attending: Hematology & Oncology

## 2015-10-13 ENCOUNTER — Encounter (HOSPITAL_COMMUNITY): Payer: Self-pay

## 2015-10-13 VITALS — BP 147/63 | HR 70 | Temp 98.3°F | Resp 18

## 2015-10-13 DIAGNOSIS — K7469 Other cirrhosis of liver: Secondary | ICD-10-CM | POA: Insufficient documentation

## 2015-10-13 DIAGNOSIS — I8511 Secondary esophageal varices with bleeding: Secondary | ICD-10-CM

## 2015-10-13 DIAGNOSIS — D500 Iron deficiency anemia secondary to blood loss (chronic): Secondary | ICD-10-CM | POA: Diagnosis present

## 2015-10-13 MED ORDER — SODIUM CHLORIDE 0.9 % IV SOLN
INTRAVENOUS | Status: DC
  Administered 2015-10-13: 14:00:00 via INTRAVENOUS

## 2015-10-13 MED ORDER — SODIUM CHLORIDE 0.9 % IV SOLN
510.0000 mg | Freq: Once | INTRAVENOUS | Status: AC
  Administered 2015-10-13: 510 mg via INTRAVENOUS
  Filled 2015-10-13: qty 17

## 2015-10-13 NOTE — Patient Instructions (Signed)
[place] at [facility] Discharge Instructions  RECOMMENDATIONS MADE BY THE CONSULTANT AND ANY TEST RESULTS WILL BE SENT TO YOUR REFERRING PHYSICIAN.  Iron infusion today. Return as scheduled for lab work and office visit.   Thank you for choosing [place] at [facility] to provide your oncology and hematology care.  To afford each patient quality time with our provider, please arrive at least 15 minutes before your scheduled appointment time.   Beginning January 23rd 2017 lab work for the [facility] will be done in the  Main lab at [facility] on 1st floor. If you have a lab appointment with the [facility] please come in thru the  Main Entrance and check in at the main information desk  You need to re-schedule your appointment should you arrive 10 or more minutes late.  We strive to give you quality time with our providers, and arriving late affects you and other patients whose appointments are after yours.  Also, if you no show three or more times for appointments you may be dismissed from the clinic at the providers discretion.     Again, thank you for [facility].  Our hope is that these requests will decrease the amount of time that you wait before being seen by our physicians.       _____________________________________________________________  Should you have questions after your visit to [facility], please contact our office at [phone] between the hours of 8:30 a.m. and 4:30 p.m.  Voicemails left after 4:30 p.m. will not be returned until the following business day.  For prescription refill requests, have your pharmacy contact our office.         Resources For Cancer Patients and their Caregivers ? American Cancer Society: Can assist with transportation, wigs, general needs, runs Look Good Feel Better.        320-614-2298 ? Cancer Care: Provides financial assistance, online  support groups, medication/co-pay assistance.  1-800-813-HOPE 714-650-8952) ? [place] Assists [place] cancer patients and their families through emotional , educational and financial support.  (562)185-1480 ? [place] DSS Where to apply for food stamps, Medicaid and utility assistance. 731-363-6648 ? RCATS: Transportation to medical appointments. 754 100 4511 ? Social Security Administration: May apply for disability if have a Stage IV cancer. (585) 659-2091 301-473-8179 ? [employer], Disability and Transit Services: Assists with nutrition, care and transit needs. [place] Support Programs: @10RELATIVEDAYS @ > Cancer Support Group  2nd Tuesday of the month 1pm-2pm, Journey Room  > Creative Journey  3rd Tuesday of the month 1130am-1pm, Journey Room  > Look Good Feel Better  1st Wednesday of the month 10am-12 noon, Journey Room (Call [place] to register [phone])

## 2015-10-13 NOTE — Progress Notes (Signed)
Tolerated infusion w/o adverse reaction.  Alert, in no distress.  VSS.  Discharged ambulatory.  

## 2015-10-26 DIAGNOSIS — E039 Hypothyroidism, unspecified: Secondary | ICD-10-CM | POA: Diagnosis not present

## 2015-10-26 DIAGNOSIS — D649 Anemia, unspecified: Secondary | ICD-10-CM | POA: Diagnosis not present

## 2015-10-26 DIAGNOSIS — K219 Gastro-esophageal reflux disease without esophagitis: Secondary | ICD-10-CM | POA: Diagnosis not present

## 2015-10-26 DIAGNOSIS — E782 Mixed hyperlipidemia: Secondary | ICD-10-CM | POA: Diagnosis not present

## 2015-10-26 DIAGNOSIS — I10 Essential (primary) hypertension: Secondary | ICD-10-CM | POA: Diagnosis not present

## 2015-10-26 DIAGNOSIS — E1165 Type 2 diabetes mellitus with hyperglycemia: Secondary | ICD-10-CM | POA: Diagnosis not present

## 2015-10-29 DIAGNOSIS — I10 Essential (primary) hypertension: Secondary | ICD-10-CM | POA: Diagnosis not present

## 2015-10-29 DIAGNOSIS — Z6841 Body Mass Index (BMI) 40.0 and over, adult: Secondary | ICD-10-CM | POA: Diagnosis not present

## 2015-10-29 DIAGNOSIS — E1165 Type 2 diabetes mellitus with hyperglycemia: Secondary | ICD-10-CM | POA: Diagnosis not present

## 2015-10-29 DIAGNOSIS — E039 Hypothyroidism, unspecified: Secondary | ICD-10-CM | POA: Diagnosis not present

## 2015-10-29 DIAGNOSIS — K219 Gastro-esophageal reflux disease without esophagitis: Secondary | ICD-10-CM | POA: Diagnosis not present

## 2015-10-29 DIAGNOSIS — F419 Anxiety disorder, unspecified: Secondary | ICD-10-CM | POA: Diagnosis not present

## 2015-10-29 DIAGNOSIS — E782 Mixed hyperlipidemia: Secondary | ICD-10-CM | POA: Diagnosis not present

## 2015-10-29 DIAGNOSIS — I5030 Unspecified diastolic (congestive) heart failure: Secondary | ICD-10-CM | POA: Diagnosis not present

## 2015-11-12 ENCOUNTER — Encounter: Payer: Self-pay | Admitting: Cardiology

## 2015-11-12 ENCOUNTER — Ambulatory Visit (INDEPENDENT_AMBULATORY_CARE_PROVIDER_SITE_OTHER): Payer: Medicare Other | Admitting: Cardiology

## 2015-11-12 VITALS — BP 111/72 | HR 90 | Ht 62.0 in | Wt 240.4 lb

## 2015-11-12 DIAGNOSIS — I5032 Chronic diastolic (congestive) heart failure: Secondary | ICD-10-CM | POA: Diagnosis not present

## 2015-11-12 DIAGNOSIS — I10 Essential (primary) hypertension: Secondary | ICD-10-CM | POA: Diagnosis not present

## 2015-11-12 NOTE — Patient Instructions (Signed)
Your physician wants you to follow-up in: 1 year with Dr. Bryna Colander will receive a reminder letter in the mail two months in advance. If you don't receive a letter, please call our office to schedule the follow-up appointment.  Your physician recommends that you continue on your current medications as directed. Please refer to the Current Medication list given to you today.  Thank you for choosing [facility]!!

## 2015-11-12 NOTE — Progress Notes (Signed)
Clinical Summary Cheryl Velasquez is a 75 y.o.female seen today for follow up of the following medical problems.   1. Chronic diastolic heart failure.  - echo 10/30/13 LVEF 55-60%, grade II diastolic dysfunction  - no SOB or DOE. Occasional LE edema. Taking lasix as needed, takes about 1-2 times a week.   2. HTN - does not check at home - compliant with meds  3. Liver cirrhosis - followed by GI. Secondary to NASH with thrombocytopenia and leukopenia. +esophageal varices.  - liver mass being monitored  4. History of breast CA Past Medical History:  Diagnosis Date  . Arthritis   . Cirrhosis ([place])   . Diabetes mellitus   . History of breast cancer   . History of chicken pox   . Hyperlipidemia   . Hypertension   . Thyroid disease      No Known Allergies   Current Outpatient Prescriptions  Medication Sig Dispense Refill  . albuterol (PROAIR HFA) 108 (90 BASE) MCG/ACT inhaler Inhale 2 puffs into the lungs every 6 (six) hours as needed for wheezing or shortness of breath.    . ALPRAZolam (XANAX) 0.5 MG tablet Take 0.5 mg by mouth 2 (two) times daily as needed for anxiety.     . Calcium Carbonate-Vitamin D (CALTRATE 600+D) 600-400 MG-UNIT per tablet Take 1 tablet by mouth daily.      . captopril (CAPOTEN) 25 MG tablet Take 25 mg by mouth 2 (two) times daily.      . fluconazole (DIFLUCAN) 150 MG tablet     . furosemide (LASIX) 20 MG tablet Take 1 tablet by mouth daily as needed.     Marland Kitchen glipiZIDE (GLUCOTROL XL) 5 MG 24 hr tablet Take 5 mg by mouth daily.      . hydroxypropyl methylcellulose (ISOPTO TEARS) 2.5 % ophthalmic solution Place 1 drop into both eyes daily as needed (Dry Eyes).    . metFORMIN (GLUCOPHAGE-XR) 500 MG 24 hr tablet Take 1,000 mg by mouth 2 (two) times daily.     . Multiple Vitamins-Minerals (CENTRUM SILVER PO) Take 1 tablet by mouth daily.      . pantoprazole (PROTONIX) 40 MG tablet TAKE 1 TABLET BY MOUTH EVERY DAY 30 tablet 5  . propranolol (INDERAL) 20 MG  tablet TAKE 1 TABLET BY MOUTH TWICE DAILY. TO REPLACE NADOLOL IN JANUARY 2017 DUE TO INSURANCE FORMULARY. 60 tablet 5  . simvastatin (ZOCOR) 40 MG tablet Take 40 mg by mouth daily.      . vitamin C (ASCORBIC ACID) 500 MG tablet Take 500 mg by mouth daily.    . vitamin E 400 UNIT capsule Take 400 Units by mouth daily.     No current facility-administered medications for this visit.      Past Surgical History:  Procedure Laterality Date  . ABDOMINAL HYSTERECTOMY  1987  . BREAST BIOPSY  2005  . ESOPHAGEAL BANDING N/A 12/13/2012   Procedure: ESOPHAGEAL BANDING;  Surgeon: Rogene Houston, MD;  [facility];  Service: Endoscopy;  Laterality: N/A;  . ESOPHAGOGASTRODUODENOSCOPY N/A 09/20/2012   Procedure: ESOPHAGOGASTRODUODENOSCOPY (EGD);  Surgeon: Rogene Houston, MD;  [facility];  Service: Endoscopy;  Laterality: N/A;  125-moved to 12:00 Ann notified pt  . ESOPHAGOGASTRODUODENOSCOPY N/A 12/13/2012   Procedure: ESOPHAGOGASTRODUODENOSCOPY (EGD);  Surgeon: Rogene Houston, MD;  [facility];  Service: Endoscopy;  Laterality: N/A;  155-moved to 220 Ann to notify pt  . HERNIA REPAIR  2004  No Known Allergies    Family History  Problem Relation Age of Onset  . Dementia Sister   . Dementia Sister   . Alzheimer's disease Sister   . Liver disease Brother   . Cancer Other     Colon Cancer, Prostate Cancer-Other Blood Relative  . Hyperlipidemia Other     Other Blood Relative  . Stroke Other     Parent, Other Blood Relative  . Diabetes Other     Parent, Other Blood Relative  . Hypertension Other     Parent  . Prostate cancer Maternal Aunt   . Dementia Maternal Aunt      Social History Ms. Giel reports that she has never smoked. She has never used smokeless tobacco. Ms. Nam reports that she does not drink alcohol.   Review of Systems CONSTITUTIONAL: No weight loss, fever, chills, weakness or fatigue.  HEENT: Eyes: No visual loss, blurred  vision, double vision or yellow sclerae.No hearing loss, sneezing, congestion, runny nose or sore throat.  SKIN: No rash or itching.  CARDIOVASCULAR: per HPI RESPIRATORY: No shortness of breath, cough or sputum.  GASTROINTESTINAL: No anorexia, nausea, vomiting or diarrhea. No abdominal pain or blood.  GENITOURINARY: No burning on urination, no polyuria NEUROLOGICAL: No headache, dizziness, syncope, paralysis, ataxia, numbness or tingling in the extremities. No change in bowel or bladder control.  MUSCULOSKELETAL: No muscle, back pain, joint pain or stiffness.  LYMPHATICS: No enlarged nodes. No history of splenectomy.  PSYCHIATRIC: No history of depression or anxiety.  ENDOCRINOLOGIC: No reports of sweating, cold or heat intolerance. No polyuria or polydipsia.  Marland Kitchen   Physical Examination Vitals:   11/12/15 1121  BP: 111/72  Pulse: 90   Vitals:   11/12/15 1121  Weight: 240 lb 6.4 oz (109 kg)  Height: 5' 2"  (1.575 m)    Gen: resting comfortably, no acute distress HEENT: no scleral icterus, pupils equal round and reactive, no palptable cervical adenopathy,  CV: RRR, no m/r/g, no jvd Resp: Clear to auscultation bilaterally GI: abdomen is soft, non-tender, non-distended, normal bowel sounds, no hepatosplenomegaly MSK: extremities are warm, no edema.  Skin: warm, no rash Neuro:  no focal deficits Psych: appropriate affect   Diagnostic Studies  10/30/13 Echo Study Conclusions  - Left ventricle: The cavity size was normal. Wall thickness was normal. Systolic function was normal. The estimated ejection fraction was in the range of 55% to 60%. Wall motion was normal; there were no regional wall motion abnormalities. Features are consistent with a pseudonormal left ventricular filling pattern, with concomitant abnormal relaxation and increased filling pressure (grade 2 diastolic dysfunction). - Aortic valve: Mildly to moderately calcified annulus. Trileaflet; mildly calcified  leaflets. There was no significant regurgitation. - Mitral valve: Moderately calcified annulus. There was mild regurgitation. - Left atrium: The atrium was moderately to severely dilated. - Right ventricle: The cavity size was mildly dilated. - Right atrium: The atrium was moderately dilated. - Tricuspid valve: There was trivial regurgitation. - Pulmonary arteries: Systolic pressure could not be accurately estimated. - Inferior vena cava: Not visualized. Unable to estimate CVP. - Pericardium, extracardiac: There was no pericardial effusion.  Impressions:  - Overall normal LV wall thickness and chamber size with LVEF 55-60%. Grade 2 diastolic dysfunction. Moderate to severe left atrial enlargement. Moderately calcified mitral annulus with mild mitral regurgitation. Mildly sclerotic aortic valve. Mildly dilated RV with normal contraction. Moderate right atrial enlargement. Trivial tricuspid regurgitation, unable to assess PASP or CVP.   10/2013 PFTs: moderate obstruction  Assessment and Plan   1. Chronic diastolic heart failure - appears euvolemic today - continue current meds - EKG in clinic shows SR with sinus arrhythmia  2. HTN - at goal, she will continue current meds    F/u 1 year     Arnoldo Lenis, M.D.

## 2015-11-24 ENCOUNTER — Other Ambulatory Visit (INDEPENDENT_AMBULATORY_CARE_PROVIDER_SITE_OTHER): Payer: Self-pay | Admitting: Internal Medicine

## 2015-11-26 DIAGNOSIS — Z23 Encounter for immunization: Secondary | ICD-10-CM | POA: Diagnosis not present

## 2015-12-10 ENCOUNTER — Other Ambulatory Visit (INDEPENDENT_AMBULATORY_CARE_PROVIDER_SITE_OTHER): Payer: Self-pay | Admitting: *Deleted

## 2015-12-10 ENCOUNTER — Encounter (INDEPENDENT_AMBULATORY_CARE_PROVIDER_SITE_OTHER): Payer: Self-pay | Admitting: *Deleted

## 2015-12-10 DIAGNOSIS — K7460 Unspecified cirrhosis of liver: Secondary | ICD-10-CM

## 2015-12-10 DIAGNOSIS — K769 Liver disease, unspecified: Secondary | ICD-10-CM

## 2015-12-18 DIAGNOSIS — K769 Liver disease, unspecified: Secondary | ICD-10-CM | POA: Diagnosis not present

## 2015-12-18 DIAGNOSIS — K7460 Unspecified cirrhosis of liver: Secondary | ICD-10-CM | POA: Diagnosis not present

## 2015-12-19 LAB — HEPATIC FUNCTION PANEL
ALT: 22 U/L (ref 6–29)
AST: 32 U/L (ref 10–35)
Albumin: 3.5 g/dL — ABNORMAL LOW (ref 3.6–5.1)
Alkaline Phosphatase: 59 U/L (ref 33–130)
BILIRUBIN DIRECT: 0.3 mg/dL — AB (ref <=0.2)
BILIRUBIN INDIRECT: 1 mg/dL (ref 0.2–1.2)
BILIRUBIN TOTAL: 1.3 mg/dL — AB (ref 0.2–1.2)
Total Protein: 6.6 g/dL (ref 6.1–8.1)

## 2015-12-19 LAB — AFP TUMOR MARKER: AFP TUMOR MARKER: 28.5 ng/mL — AB (ref <6.1)

## 2015-12-22 ENCOUNTER — Other Ambulatory Visit (INDEPENDENT_AMBULATORY_CARE_PROVIDER_SITE_OTHER): Payer: Self-pay | Admitting: Internal Medicine

## 2015-12-22 ENCOUNTER — Other Ambulatory Visit (HOSPITAL_COMMUNITY): Payer: Self-pay | Admitting: Interventional Radiology

## 2015-12-22 ENCOUNTER — Ambulatory Visit (INDEPENDENT_AMBULATORY_CARE_PROVIDER_SITE_OTHER): Payer: Medicare Other | Admitting: Internal Medicine

## 2015-12-22 ENCOUNTER — Encounter (INDEPENDENT_AMBULATORY_CARE_PROVIDER_SITE_OTHER): Payer: Self-pay | Admitting: Internal Medicine

## 2015-12-22 VITALS — BP 138/78 | HR 74 | Temp 98.2°F | Resp 16 | Ht 63.0 in | Wt 236.1 lb

## 2015-12-22 DIAGNOSIS — K7460 Unspecified cirrhosis of liver: Secondary | ICD-10-CM | POA: Diagnosis not present

## 2015-12-22 DIAGNOSIS — K769 Liver disease, unspecified: Secondary | ICD-10-CM

## 2015-12-22 DIAGNOSIS — R772 Abnormality of alphafetoprotein: Secondary | ICD-10-CM

## 2015-12-22 DIAGNOSIS — K7469 Other cirrhosis of liver: Secondary | ICD-10-CM | POA: Insufficient documentation

## 2015-12-22 DIAGNOSIS — I8510 Secondary esophageal varices without bleeding: Secondary | ICD-10-CM | POA: Insufficient documentation

## 2015-12-22 NOTE — Patient Instructions (Signed)
Esophagogastroduodenoscopy and possible esophageal variceal banding to be scheduled. Consultation with Dr. Kathlene Cote.

## 2015-12-22 NOTE — Progress Notes (Addendum)
Presenting complaint;  Follow-up for cirrhosis and liver lesion.  Database and Subjective:  Patient is 75 year old Caucasian female who was diagnosed with stage IV cirrhosis secondary to NASH over 3 years ago. She has undergone esophageal variceal banding for primary prophylaxis in August and October 2014 and not thereafter. She has had mildly elevated AFP dating back to April 2014 when it was 11. Her alpha-fetoprotein has been gradually increasing. She has had periodic ultrasounds. Ultrasound in May 2016 revealed 14 mm indeterminant left hepatic lobe lesion. MRI on 07/23/2014 revealed 14 mm subcapsular hypovascular lesion in segment 4A suspicious for HCC. Ultrasound-guided biopsy not performed in June 2016 because this lesion was not visible. It was decided to follow patient with imaging studies. MRI in November 2016 revealed 11 mm arterially enhancing subcapsular lesion in segment 4A felt to be stable. MRI on 06/30/2015 revealed 22 x 16 mm arterial enhancing lesion in segment 4A. AFP on 06/16/2015 was 22.7 Patient was not interested in further diagnostic studies and was decided to monitor with AFP. AFP on 09/14/2015 was 22. She had AFP last week and is now here for scheduled visit. He has no complaints. She has gained 4 pounds since her last visit. She states she has left knee arthritis and unable to do walking or exercise. Heartburn is well controlled with therapy. She denies abdominal pain melena or rectal bleeding or dysphagia. She remains with good appetite. She does not drive.  Her niece Vaughan Basta did not come with her today.   Current Medications: Outpatient Encounter Prescriptions as of 12/22/2015  Medication Sig  . albuterol (PROAIR HFA) 108 (90 BASE) MCG/ACT inhaler Inhale 2 puffs into the lungs every 6 (six) hours as needed for wheezing or shortness of breath.  . ALPRAZolam (XANAX) 0.5 MG tablet Take 0.5 mg by mouth 2 (two) times daily as needed for anxiety.   . Calcium  Carbonate-Vitamin D (CALTRATE 600+D) 600-400 MG-UNIT per tablet Take 1 tablet by mouth daily.    . captopril (CAPOTEN) 25 MG tablet Take 25 mg by mouth 2 (two) times daily.    . furosemide (LASIX) 20 MG tablet Take 1 tablet by mouth daily as needed.   Marland Kitchen glipiZIDE (GLUCOTROL XL) 5 MG 24 hr tablet Take 5 mg by mouth daily.    . hydroxypropyl methylcellulose (ISOPTO TEARS) 2.5 % ophthalmic solution Place 1 drop into both eyes daily as needed (Dry Eyes).  . metFORMIN (GLUCOPHAGE-XR) 500 MG 24 hr tablet Take 1,000 mg by mouth 2 (two) times daily.   . Multiple Vitamins-Minerals (CENTRUM SILVER PO) Take 1 tablet by mouth daily.    . pantoprazole (PROTONIX) 40 MG tablet TAKE 1 TABLET BY MOUTH EVERY DAY  . propranolol (INDERAL) 20 MG tablet TAKE 1 TABLET BY MOUTH TWICE DAILY. TO REPLACE NADOLOL IN JANUARY 2017 DUE TO INSURANCE FORMULARY.  . simvastatin (ZOCOR) 40 MG tablet Take 40 mg by mouth daily.    . vitamin C (ASCORBIC ACID) 500 MG tablet Take 500 mg by mouth daily.  . vitamin E 400 UNIT capsule Take 400 Units by mouth daily.   No facility-administered encounter medications on file as of 12/22/2015.      Objective: Blood pressure 138/78, pulse 74, temperature 98.2 F (36.8 C), temperature source Oral, resp. rate 16, height 5' 3"  (1.6 m), weight 236 lb 1.6 oz (107.1 kg). Patient is alert and does not have asterixis. Conjunctiva is pink. Sclera is nonicteric Oropharyngeal mucosa is normal. No neck masses or thyromegaly noted. Cardiac exam with regular rhythm  normal S1 and S2. Faint systolic murmur noted at left sternal border. Lungs are clear to auscultation. Abdomen is obese but soft and nontender. Difficult to palpate liver or spleen. Shifting dullness absent.  No LE edema or clubbing noted.  Labs/studies Results: AFP 28.5 on 12/18/2015  AFP 22.0  on 09/14/2015  AFP 22.7  on 06/16/2015  AFP 12.2  on 12/24/2014   Lab data from 10/26/2015 WBC 3.0, H&H 12.3 and 37.2 and platelet count  51K. BUN 10 and creatinine 0.81 Bilirubin 1.1, AP 66, AST 28, ALT 20, total protein 6.6 and albumin 3.4. Serum calcium 9.0.   MRI results as above.  Assessment:  #1. Enlarging lesion in segment 4A of liver suspicious for HCC. AFP has been gradually increasing from a value of 11 on 06/11/2012(first test0. Attempt at ultrasound-guided biopsy last year was not possible because the lesion could not be found. She needs to be reevaluated. If this lesion needs criterion for [facility] she would simply proceed with ablation without biopsy. Given her body habitus approach this lesion may be difficult if not impossible. She has thrombocytopenia and may need platelet transfusion at the time of intervention. #2. Stage IV cirrhosis secondary to NASH. #3. History of esophageal varices. Last banding was 3 years ago and she is long overdue for repeat evaluation.   Plan:  Patient referred to Dr. Aletta Edouard for further evaluation of hepatic lesion. She will need repeat imaging because last one was 6 months ago. EGD with esophageal variceal banding if indicated. Office visit in 3 months.

## 2015-12-25 ENCOUNTER — Encounter (INDEPENDENT_AMBULATORY_CARE_PROVIDER_SITE_OTHER): Payer: Self-pay

## 2016-01-04 DIAGNOSIS — L03311 Cellulitis of abdominal wall: Secondary | ICD-10-CM | POA: Diagnosis not present

## 2016-01-04 DIAGNOSIS — L820 Inflamed seborrheic keratosis: Secondary | ICD-10-CM | POA: Diagnosis not present

## 2016-01-04 DIAGNOSIS — Z6841 Body Mass Index (BMI) 40.0 and over, adult: Secondary | ICD-10-CM | POA: Diagnosis not present

## 2016-01-11 ENCOUNTER — Encounter (HOSPITAL_COMMUNITY): Payer: Self-pay | Admitting: *Deleted

## 2016-01-11 ENCOUNTER — Encounter (HOSPITAL_COMMUNITY)
Admission: RE | Disposition: A | Discharge status: Home / Self Care | Payer: Self-pay | Source: Ambulatory Visit | Attending: Internal Medicine

## 2016-01-11 ENCOUNTER — Ambulatory Visit (HOSPITAL_COMMUNITY)
Admission: RE | Admit: 2016-01-11 | Discharge: 2016-01-11 | Disposition: A | Discharge status: Home / Self Care | Payer: Medicare Other | Source: Ambulatory Visit | Attending: Internal Medicine | Admitting: Internal Medicine

## 2016-01-11 DIAGNOSIS — Z79899 Other long term (current) drug therapy: Secondary | ICD-10-CM | POA: Insufficient documentation

## 2016-01-11 DIAGNOSIS — K766 Portal hypertension: Secondary | ICD-10-CM | POA: Diagnosis not present

## 2016-01-11 DIAGNOSIS — K228 Other specified diseases of esophagus: Secondary | ICD-10-CM | POA: Diagnosis not present

## 2016-01-11 DIAGNOSIS — Z853 Personal history of malignant neoplasm of breast: Secondary | ICD-10-CM | POA: Insufficient documentation

## 2016-01-11 DIAGNOSIS — K7460 Unspecified cirrhosis of liver: Secondary | ICD-10-CM | POA: Insufficient documentation

## 2016-01-11 DIAGNOSIS — E785 Hyperlipidemia, unspecified: Secondary | ICD-10-CM | POA: Diagnosis not present

## 2016-01-11 DIAGNOSIS — I8510 Secondary esophageal varices without bleeding: Secondary | ICD-10-CM | POA: Diagnosis not present

## 2016-01-11 DIAGNOSIS — I10 Essential (primary) hypertension: Secondary | ICD-10-CM | POA: Insufficient documentation

## 2016-01-11 DIAGNOSIS — K7469 Other cirrhosis of liver: Secondary | ICD-10-CM | POA: Insufficient documentation

## 2016-01-11 DIAGNOSIS — E119 Type 2 diabetes mellitus without complications: Secondary | ICD-10-CM | POA: Insufficient documentation

## 2016-01-11 DIAGNOSIS — K3189 Other diseases of stomach and duodenum: Secondary | ICD-10-CM | POA: Insufficient documentation

## 2016-01-11 DIAGNOSIS — Z7984 Long term (current) use of oral hypoglycemic drugs: Secondary | ICD-10-CM | POA: Diagnosis not present

## 2016-01-11 HISTORY — PX: ESOPHAGOGASTRODUODENOSCOPY: SHX5428

## 2016-01-11 LAB — GLUCOSE, CAPILLARY: Glucose-Capillary: 148 mg/dL — ABNORMAL HIGH (ref 65–99)

## 2016-01-11 SURGERY — EGD (ESOPHAGOGASTRODUODENOSCOPY)
Anesthesia: Moderate Sedation

## 2016-01-11 MED ORDER — MIDAZOLAM HCL 5 MG/5ML IJ SOLN
INTRAMUSCULAR | Status: AC
  Filled 2016-01-11: qty 10

## 2016-01-11 MED ORDER — STERILE WATER FOR IRRIGATION IR SOLN
Status: DC | PRN
  Administered 2016-01-11: 08:00:00

## 2016-01-11 MED ORDER — MEPERIDINE HCL 50 MG/ML IJ SOLN
INTRAMUSCULAR | Status: AC
  Filled 2016-01-11: qty 1

## 2016-01-11 MED ORDER — SODIUM CHLORIDE 0.9 % IV SOLN
INTRAVENOUS | Status: DC
  Administered 2016-01-11: 07:00:00 via INTRAVENOUS

## 2016-01-11 MED ORDER — BUTAMBEN-TETRACAINE-BENZOCAINE 2-2-14 % EX AERO
INHALATION_SPRAY | CUTANEOUS | Status: DC | PRN
Start: 2016-01-11 — End: 2016-01-11
  Administered 2016-01-11: 2 via TOPICAL

## 2016-01-11 MED ORDER — MEPERIDINE HCL 50 MG/ML IJ SOLN
INTRAMUSCULAR | Status: DC | PRN
  Administered 2016-01-11 (×2): 25 mg via INTRAVENOUS

## 2016-01-11 MED ORDER — MIDAZOLAM HCL 5 MG/5ML IJ SOLN
INTRAMUSCULAR | Status: DC | PRN
  Administered 2016-01-11 (×3): 2 mg via INTRAVENOUS

## 2016-01-11 NOTE — Op Note (Signed)
[facility] Patient Name: Cheryl Velasquez Procedure Date: 01/11/2016 7:43 AM MRN: [id number] Date of Birth: Jun 24, 1940 Attending MD: Hildred Laser , MD CSN: [id number] Age: 75 Admit Type: Outpatient Procedure:                Upper GI endoscopy Indications:              Follow-up of esophageal varices, For therapy of                            esophageal varices Providers:                Hildred Laser, MD, Otis Peak B. Sharon Seller, RN, Randa Spike, Technician Referring MD:             Mitzie Na. Quillian Quince, MD Medicines:                Cetacaine spray, Meperidine 50 mg IV, Midazolam 6                            mg IV Complications:            No immediate complications. Estimated Blood Loss:     Estimated blood loss: none. Procedure:                Pre-Anesthesia Assessment:                           - Prior to the procedure, a History and Physical                            was performed, and patient medications and                            allergies were reviewed. The patient's tolerance of                            previous anesthesia was also reviewed. The risks                            and benefits of the procedure and the sedation                            options and risks were discussed with the patient.                            All questions were answered, and informed consent                            was obtained. Prior Anticoagulants: The patient has                            taken no previous anticoagulant or antiplatelet  agents. ASA Grade Assessment: III - A patient with                            severe systemic disease. After reviewing the risks                            and benefits, the patient was deemed in                            satisfactory condition to undergo the procedure.                           After obtaining informed consent, the endoscope was                            passed under direct  vision. Throughout the                            procedure, the patient's blood pressure, pulse, and                            oxygen saturations were monitored continuously. The                            [id number] ([id number]) was introduced through the                            mouth, and advanced to the second part of duodenum.                            The upper GI endoscopy was accomplished without                            difficulty. The patient tolerated the procedure                            well. Scope In: 8:25:17 AM Scope Out: 8:29:01 AM Total Procedure Duration: 0 hours 3 minutes 44 seconds  Findings:      Grade II varices were found in the middle third of the esophagus and in       the lower third of the esophagus.      A post variceal banding scar was found in the distal esophagus. The scar       tissue was healthy in appearance.      The Z-line was regular and was found 38 cm from the incisors.      Portal hypertensive gastropathy was found in the gastric fundus.      Nodular mucosa was found in the gastric antrum.      The exam of the stomach was otherwise normal.      The duodenal bulb and second portion of the duodenum were normal. Impression:               - Grade II two columns of esophageal varices not  large enough to be banded.                           - Scarring in the distal esophagus from previous                            banding.                           - Z-line regular, 38 cm from the incisors.                           - Portal hypertensive gastropathy.                           - Nodular mucosa in the gastric antrum.                           - Normal duodenal bulb and second portion of the                            duodenum.                           - No specimens collected. Moderate Sedation:      Moderate (conscious) sedation was administered by the endoscopy nurse       and supervised by the endoscopist. The  following parameters were       monitored: oxygen saturation, heart rate, blood pressure, CO2       capnography and response to care. Total physician intraservice time was       11 minutes. Recommendation:           - Patient has a contact number available for                            emergencies. The signs and symptoms of potential                            delayed complications were discussed with the                            patient. Return to normal activities tomorrow.                            Written discharge instructions were provided to the                            patient.                           - Resume previous diet today.                           - Continue present medications.                           - Repeat upper endoscopy in  1 year. Procedure Code(s):        --- Professional ---                           [phone], Esophagogastroduodenoscopy, flexible,                            transoral; diagnostic, including collection of                            specimen(s) by brushing or washing, when performed                            (separate procedure)                           99152, Moderate sedation services provided by the                            same physician or other qualified health care                            professional performing the diagnostic or                            therapeutic service that the sedation supports,                            requiring the presence of an independent trained                            observer to assist in the monitoring of the                            patient's level of consciousness and physiological                            status; initial 15 minutes of intraservice time,                            patient age 49 years or older Diagnosis Code(s):        --- Professional ---                           I85.00, Esophageal varices without bleeding                           K22.8, Other specified diseases of  esophagus                           K76.6, Portal hypertension                           K31.89, Other diseases of stomach and duodenum CPT copyright 2016 American [employer] Association. All rights reserved. The codes documented in this report are preliminary and upon coder review may  be revised to meet current compliance requirements. Hildred Laser, MD Hildred Laser, MD 01/11/2016 8:42:32 AM This report has been signed electronically. Number of Addenda: 0

## 2016-01-11 NOTE — H&P (Signed)
Cheryl Velasquez is an 75 y.o. female.   Chief Complaint: Patient is here for EGD and possible esophageal variceal banding. HPI: Patient is 75 year old Caucasian female was cirrhosis secondary to NASH who has undergone esophageal variceal banding in the past for primary prophylaxis. Her last exam was in October 2014. She has not experienced melena or rectal bleeding and hematemesis or abdominal pain. She has rising AFP and is to be reevaluated by Dr. Aletta Edouard in near future.  Past Medical History:  Diagnosis Date  . Arthritis   . Cirrhosis ([place])   . Diabetes mellitus   . History of breast cancer   . History of chicken pox   . Hyperlipidemia   . Hypertension   . Thyroid disease     Past Surgical History:  Procedure Laterality Date  . ABDOMINAL HYSTERECTOMY  1987  . BREAST BIOPSY  2005  . ESOPHAGEAL BANDING N/A 12/13/2012   Procedure: ESOPHAGEAL BANDING;  Surgeon: Rogene Houston, MD;  [facility];  Service: Endoscopy;  Laterality: N/A;  . ESOPHAGOGASTRODUODENOSCOPY N/A 09/20/2012   Procedure: ESOPHAGOGASTRODUODENOSCOPY (EGD);  Surgeon: Rogene Houston, MD;  [facility];  Service: Endoscopy;  Laterality: N/A;  125-moved to 12:00 Ann notified pt  . ESOPHAGOGASTRODUODENOSCOPY N/A 12/13/2012   Procedure: ESOPHAGOGASTRODUODENOSCOPY (EGD);  Surgeon: Rogene Houston, MD;  [facility];  Service: Endoscopy;  Laterality: N/A;  155-moved to 220 Ann to notify pt  . HERNIA REPAIR  2004    Family History  Problem Relation Age of Onset  . Dementia Sister   . Dementia Sister   . Alzheimer's disease Sister   . Liver disease Brother   . Cancer Other     Colon Cancer, Prostate Cancer-Other Blood Relative  . Hyperlipidemia Other     Other Blood Relative  . Stroke Other     Parent, Other Blood Relative  . Diabetes Other     Parent, Other Blood Relative  . Hypertension Other     Parent  . Prostate cancer Maternal Aunt   . Dementia Maternal Aunt     Social History:  reports that she has never smoked. She has never used smokeless tobacco. She reports that she does not drink alcohol or use drugs.  Allergies: No Known Allergies  Medications Prior to Admission  Medication Sig Dispense Refill  . ALPRAZolam (XANAX) 0.5 MG tablet Take 0.5 mg by mouth 2 (two) times daily as needed for anxiety.     . Calcium Carbonate-Vitamin D (CALTRATE 600+D) 600-400 MG-UNIT per tablet Take 1 tablet by mouth daily.      . captopril (CAPOTEN) 25 MG tablet Take 25 mg by mouth 2 (two) times daily.      . furosemide (LASIX) 20 MG tablet Take 1 tablet by mouth daily as needed.     Marland Kitchen glipiZIDE (GLUCOTROL XL) 5 MG 24 hr tablet Take 5 mg by mouth daily.      . hydroxypropyl methylcellulose (ISOPTO TEARS) 2.5 % ophthalmic solution Place 1 drop into both eyes daily as needed (Dry Eyes).    . metFORMIN (GLUCOPHAGE-XR) 500 MG 24 hr tablet Take 1,000 mg by mouth 2 (two) times daily.     . Multiple Vitamins-Minerals (CENTRUM SILVER PO) Take 1 tablet by mouth daily.      . pantoprazole (PROTONIX) 40 MG tablet TAKE 1 TABLET BY MOUTH EVERY DAY 30 tablet 5  . propranolol (INDERAL) 20 MG tablet TAKE 1 TABLET BY MOUTH TWICE DAILY. TO REPLACE NADOLOL IN  JANUARY 2017 DUE TO INSURANCE FORMULARY. 60 tablet 5  . simvastatin (ZOCOR) 40 MG tablet Take 40 mg by mouth daily.      . vitamin C (ASCORBIC ACID) 500 MG tablet Take 500 mg by mouth daily.    Marland Kitchen albuterol (PROAIR HFA) 108 (90 BASE) MCG/ACT inhaler Inhale 2 puffs into the lungs every 6 (six) hours as needed for wheezing or shortness of breath.      Results for orders placed or performed during the hospital encounter of 01/11/16 (from the past 48 hour(s))  Glucose, capillary     Status: Abnormal   Collection Time: 01/11/16  6:51 AM  Result Value Ref Range   Glucose-Capillary 148 (H) 65 - 99 mg/dL   No results found.  ROS  Blood pressure (!) 155/69, pulse 66, temperature 98.2 F (36.8 C), temperature source Oral, resp.  rate (!) 26, height 5' 3"  (1.6 m), weight 236 lb (107 kg), SpO2 100 %. Physical Exam  Constitutional: She appears well-developed and well-nourished.  HENT:  Mouth/Throat: Oropharynx is clear and moist.  Eyes: Conjunctivae are normal. No scleral icterus.  Neck: No thyromegaly present.  Cardiovascular: Normal rate, regular rhythm and normal heart sounds.   No murmur heard. Respiratory: Effort normal and breath sounds normal.  GI:  Abdomen is full but soft and nontender without organomegaly or masses.  Lymphadenopathy:    She has no cervical adenopathy.     Assessment/Plan History of esophageal varices secondary to cirrhosis. EGD with possible esophageal variceal banding.   Hildred Laser, MD 01/11/2016, 8:12 AM

## 2016-01-11 NOTE — Discharge Instructions (Signed)
Resume usual medications and diet. No driving for 24 hours. Office visit as planned.  Esophagogastroduodenoscopy, Care After Introduction Refer to this sheet in the next few weeks. These instructions provide you with information about caring for yourself after your procedure. Your health care provider may also give you more specific instructions. Your treatment has been planned according to current medical practices, but problems sometimes occur. Call your health care provider if you have any problems or questions after your procedure. What can I expect after the procedure? After the procedure, it is common to have:  A sore throat.  Nausea.  Bloating.  Dizziness.  Fatigue. Follow these instructions at home:  Do not eat or drink anything until the numbing medicine (local anesthetic) has worn off and your gag reflex has returned. You will know that the local anesthetic has worn off when you can swallow comfortably.  Do not drive for 24 hours if you received a medicine to help you relax (sedative).  If your health care provider took a tissue sample for testing during the procedure, make sure to get your test results. This is your responsibility. Ask your health care provider or the department performing the test when your results will be ready.  Keep all follow-up visits as told by your health care provider. This is important. Contact a health care provider if:  You cannot stop coughing.  You are not urinating.  You are urinating less than usual. Get help right away if:  You have trouble swallowing.  You cannot eat or drink.  You have throat or chest pain that gets worse.  You are dizzy or light-headed.  You faint.  You have nausea or vomiting.  You have chills.  You have a fever.  You have severe abdominal pain.  You have black, tarry, or bloody stools. This information is not intended to replace advice given to you by your health care provider. Make sure you  discuss any questions you have with your health care provider. Document Released: 01/18/2012 Document Revised: 07/09/2015 Document Reviewed: 12/25/2014  2017 Elsevier

## 2016-01-12 ENCOUNTER — Ambulatory Visit (HOSPITAL_COMMUNITY): Admission: RE | Admit: 2016-01-12 | Payer: Medicare Other | Source: Ambulatory Visit

## 2016-01-18 ENCOUNTER — Ambulatory Visit (HOSPITAL_COMMUNITY)
Admission: RE | Admit: 2016-01-18 | Discharge: 2016-01-18 | Disposition: A | Discharge status: Home / Self Care | Payer: Medicare Other | Source: Ambulatory Visit | Attending: Interventional Radiology | Admitting: Interventional Radiology

## 2016-01-18 DIAGNOSIS — I8510 Secondary esophageal varices without bleeding: Secondary | ICD-10-CM | POA: Diagnosis not present

## 2016-01-18 DIAGNOSIS — K3189 Other diseases of stomach and duodenum: Secondary | ICD-10-CM | POA: Diagnosis not present

## 2016-01-18 DIAGNOSIS — E119 Type 2 diabetes mellitus without complications: Secondary | ICD-10-CM | POA: Diagnosis not present

## 2016-01-18 DIAGNOSIS — I10 Essential (primary) hypertension: Secondary | ICD-10-CM | POA: Diagnosis not present

## 2016-01-18 DIAGNOSIS — K7460 Unspecified cirrhosis of liver: Secondary | ICD-10-CM | POA: Diagnosis not present

## 2016-01-18 DIAGNOSIS — K769 Liver disease, unspecified: Secondary | ICD-10-CM

## 2016-01-18 DIAGNOSIS — K766 Portal hypertension: Secondary | ICD-10-CM | POA: Diagnosis not present

## 2016-01-18 LAB — CREATININE, SERUM: CREATININE: 0.79 mg/dL (ref 0.44–1.00)

## 2016-01-18 MED ORDER — GADOXETATE DISODIUM 0.25 MMOL/ML IV SOLN
10.0000 mL | Freq: Once | INTRAVENOUS | Status: AC | PRN
  Administered 2016-01-18: 10 mL via INTRAVENOUS

## 2016-01-19 ENCOUNTER — Ambulatory Visit
Admission: RE | Admit: 2016-01-19 | Discharge: 2016-01-19 | Disposition: A | Discharge status: Home / Self Care | Payer: Medicare Other | Source: Ambulatory Visit | Attending: Internal Medicine | Admitting: Internal Medicine

## 2016-01-19 DIAGNOSIS — K769 Liver disease, unspecified: Secondary | ICD-10-CM

## 2016-01-19 HISTORY — PX: IR GENERIC HISTORICAL: IMG1180011

## 2016-01-19 NOTE — Consult Note (Signed)
Chief Complaint: Patient was seen in consultation today for enlarging liver mass at the request of Rehman,Najeeb U  Referring Physician(s): Rehman,Najeeb U  History of Present Illness: Cheryl Velasquez is a 75 y.o. female with a history of cirrhosis secondary to NASH.  She has had development of a mass in segment 4a in the left lobe of the liver which has been followed by imaging and shows enlargement, now measuring 2.1 x 3 cm and consistent with hepatocellular carcinoma by LI-RADS criteria on MRI.  She has a history of splenomegaly, thrombocytopenia and prior banding of esophageal varices.  She has no history of recent bleeding, encephalopathy or significant ascites.    Past Medical History:  Diagnosis Date  . Arthritis   . Cirrhosis ([address])   . Diabetes mellitus   . History of breast cancer   . History of chicken pox   . Hyperlipidemia   . Hypertension   . Thyroid disease     Past Surgical History:  Procedure Laterality Date  . ABDOMINAL HYSTERECTOMY  1987  . BREAST BIOPSY  2005  . ESOPHAGEAL BANDING N/A 12/13/2012   Procedure: ESOPHAGEAL BANDING;  Surgeon: Rogene Houston, MD;  Location: [facility];  Service: Endoscopy;  Laterality: N/A;  . ESOPHAGOGASTRODUODENOSCOPY N/A 09/20/2012   Procedure: ESOPHAGOGASTRODUODENOSCOPY (EGD);  Surgeon: Rogene Houston, MD;  Location: [facility];  Service: Endoscopy;  Laterality: N/A;  125-moved to 12:00 Ann notified pt  . ESOPHAGOGASTRODUODENOSCOPY N/A 12/13/2012   Procedure: ESOPHAGOGASTRODUODENOSCOPY (EGD);  Surgeon: Rogene Houston, MD;  Location: [facility];  Service: Endoscopy;  Laterality: N/A;  155-moved to 220 Ann to notify pt  . HERNIA REPAIR  2004    Allergies: Patient has no known allergies.  Medications: Prior to Admission medications   Medication Sig Start Date End Date Taking? Authorizing Provider  ALPRAZolam Duanne Moron) 0.5 MG tablet Take 0.5 mg by mouth 2 (two) times daily as needed for anxiety.    Yes Historical  Provider, MD  Calcium Carbonate-Vitamin D (CALTRATE 600+D) 600-400 MG-UNIT per tablet Take 1 tablet by mouth daily.     Yes Historical Provider, MD  captopril (CAPOTEN) 25 MG tablet Take 25 mg by mouth 2 (two) times daily.     Yes Historical Provider, MD  furosemide (LASIX) 20 MG tablet Take 1 tablet by mouth daily as needed.  10/07/13  Yes Historical Provider, MD  glipiZIDE (GLUCOTROL XL) 5 MG 24 hr tablet Take 5 mg by mouth daily.     Yes Historical Provider, MD  hydroxypropyl methylcellulose (ISOPTO TEARS) 2.5 % ophthalmic solution Place 1 drop into both eyes daily as needed (Dry Eyes).   Yes Historical Provider, MD  metFORMIN (GLUCOPHAGE-XR) 500 MG 24 hr tablet Take 1,000 mg by mouth 2 (two) times daily.    Yes Historical Provider, MD  Multiple Vitamins-Minerals (CENTRUM SILVER PO) Take 1 tablet by mouth daily.     Yes Historical Provider, MD  pantoprazole (PROTONIX) 40 MG tablet TAKE 1 TABLET BY MOUTH EVERY DAY 11/24/15  Yes Butch Penny, NP  propranolol (INDERAL) 20 MG tablet TAKE 1 TABLET BY MOUTH TWICE DAILY. TO REPLACE NADOLOL IN JANUARY 2017 DUE TO INSURANCE FORMULARY. 08/31/15  Yes Rogene Houston, MD  simvastatin (ZOCOR) 40 MG tablet Take 40 mg by mouth daily.     Yes Historical Provider, MD  vitamin C (ASCORBIC ACID) 500 MG tablet Take 500 mg by mouth daily.   Yes Historical Provider, MD  albuterol (PROAIR HFA) 108 (90  BASE) MCG/ACT inhaler Inhale 2 puffs into the lungs every 6 (six) hours as needed for wheezing or shortness of breath.    Historical Provider, MD     Family History  Problem Relation Age of Onset  . Dementia Sister   . Dementia Sister   . Alzheimer's disease Sister   . Liver disease Brother   . Cancer Other     Colon Cancer, Prostate Cancer-Other Blood Relative  . Hyperlipidemia Other     Other Blood Relative  . Stroke Other     Parent, Other Blood Relative  . Diabetes Other     Parent, Other Blood Relative  . Hypertension Other     Parent  . Prostate cancer  Maternal Aunt   . Dementia Maternal Aunt     Social History   Social History  . Marital status: Single    Spouse name: N/A  . Number of children: 0  . Years of education: 10   Occupational History  . Textiles    Social History Main Topics  . Smoking status: Never Smoker  . Smokeless tobacco: Never Used  . Alcohol use No  . Drug use: No  . Sexual activity: No   Other Topics Concern  . Not on file   Social History Narrative   Regular Exercise-no    ECOG Status: 0 - Asymptomatic  Review of Systems: A 12 point ROS discussed and pertinent positives are indicated in the HPI above.  All other systems are negative.  Review of Systems  Constitutional: Negative.   HENT: Negative.   Respiratory: Negative.   Cardiovascular: Negative.   Gastrointestinal: Negative.   Genitourinary: Negative.   Musculoskeletal: Negative.   Neurological: Negative.   Hematological: Bruises/bleeds easily.    Vital Signs: BP (!) 171/65 (BP Location: Left Arm, Patient Position: Sitting, Cuff Size: Large)   Pulse 70   Temp 98.1 F (36.7 C) (Oral)   Resp 17   Ht 5' 2" (1.575 m)   Wt 233 lb (105.7 kg)   SpO2 96%   BMI 42.62 kg/m   Physical Exam  Constitutional: She is oriented to person, place, and time. She appears well-nourished. No distress.  HENT:  Head: Normocephalic and atraumatic.  Neck: Neck supple. No JVD present.  Cardiovascular: Normal rate, regular rhythm and normal heart sounds.  Exam reveals no gallop and no friction rub.   No murmur heard. Pulmonary/Chest: Effort normal. No stridor. No respiratory distress. She has no wheezes. She has no rales.  Abdominal: Soft. She exhibits no distension. There is no tenderness. There is no guarding.  Musculoskeletal: She exhibits no edema.  Neurological: She is alert and oriented to person, place, and time.  Skin: She is not diaphoretic.  Nursing note and vitals reviewed.   Imaging: Mr Abdomen Wwo Contrast  Result Date:  01/19/2016 CLINICAL DATA:  Cirrhosis, prior esophageal there still banding. Rising AFP. Follow up of a liver lesion. EXAM: MRI ABDOMEN WITHOUT AND WITH CONTRAST TECHNIQUE: Multiplanar multisequence MR imaging of the abdomen was performed both before and after the administration of intravenous contrast. CONTRAST:  10 cc Eovist COMPARISON:  Multiple exams, including 06/30/2015 FINDINGS: Lower chest: Similar to prior there is mild stranding and several small lymph nodes in the lower thoracic paraesophageal region. Hepatobiliary: Segment 4a nodule anteriorly measures 3.0 by 2.1 cm (previously 2.2 by 1.6 cm) and has washout but no capsule appearance. The size has significantly increased compatible with interval growth. This lesion is LI-RADS category LR-5 (definitely hepatocellular carcinoma) by  imaging characteristics. Nodular contour of the liver. Mildly heterogeneous enhancement liver diffusely likely due to fibrosis and architectural distortion. Pancreas:  Unremarkable Spleen: Spleen measures 18.0 by 5.4 by 14.3 cm (volume = 730 cm^3). Adrenals/Urinary Tract: Several tiny nonenhancing lesions in both kidneys are present and likely cysts. 4 mm T1 hyperintense lesion of the right kidney lower pole on image 91/1000 is likely a complex cyst but technically too small to characterize. Similar 2-3 mm lesion in the right kidney upper pole, image 59/1000. Stomach/Bowel: Unremarkable Vascular/Lymphatic: Patent portal vein and splenic vein. Separate origins of the hepatic artery and celiac trunk with some atherosclerotic plaque in the vicinity of the origins. Reactive lymph nodes in the porta hepatis. Other:  No supplemental non-categorized findings. Musculoskeletal: There is evidence of lumbar spondylosis and degenerative disc disease. IMPRESSION: 1. Interval growth and capsule appearance of the segment 4a nodule anteriorly, placing it has LI-RADS category LR -5 (definitely hepatocellular carcinoma). 2. Hepatic cirrhosis. 3.  Several tiny cysts in the kidneys, there 2 hyperintense precontrast T1 lesions in the right kidney which are tiny but likely complex cysts. Electronically Signed   By: Van Clines M.D.   On: 01/19/2016 09:27    Labs:  CBC:  Recent Labs  09/09/15 1600  WBC 3.3*  HGB 12.2  HCT 35.5*  PLT 69*    COAGS: No results for input(s): INR, APTT in the last 8760 hours.  BMP:  Recent Labs  06/30/15 1352 09/09/15 1600 01/18/16 1635  NA  --  136  --   K  --  4.3  --   CL  --  103  --   CO2  --  25  --   GLUCOSE  --  341*  --   BUN  --  8  --   CALCIUM  --  8.5*  --   CREATININE 0.67 0.75 0.79  GFRNONAA >60 >60 >60  GFRAA >60 >60 >60    LIVER FUNCTION TESTS:  Recent Labs  09/09/15 1600 12/18/15 1129  BILITOT 1.1 1.3*  AST 35 32  ALT 25 22  ALKPHOS 72 59  PROT 7.2 6.6  ALBUMIN 3.5 3.5*    TUMOR MARKERS:  Recent Labs  06/16/15 1609 09/14/15 0831 12/18/15 1130  AFPTM 22.7* 22.0* 28.5*    Assessment and Plan:  I met with Cheryl Velasquez and her niece Cheryl Velasquez. We reviewed prior imaging including the more recent MRI studies dated 06/30/2015 and 01/18/2016.  The enhancing mass in segment 4A of the liver has increased from 1.6 x 2.1 cm to 2.1 x 3 cm in just under 7 months and is a LI-RADS LR5 lesion by imaging, consistent with hepatocellular carcinoma.  AFP level also continues to rise.  There are no other suspicious liver lesions.  Based on size and location, Cheryl Velasquez would be a good candidate for percutaneous thermal ablation of this lesion.  She would be a very borderline candidate for surgical resection and is at risk of developing other foci of [place] in the liver in the future.  Details of ablation were discussed with Cheryl Velasquez and her niece, including risks.  She would likely need platelet transfusion just prior to the procedure given chronic platelet count of around 70K.  The procedure would be performed under general anesthesia and involve overnight observation.   After discussion, Cheryl Velasquez would like to proceed with scheduling ablation of the hepatocellular carcinoma.  Thank you for this interesting consult.  I greatly enjoyed meeting Cheryl Velasquez and  look forward to participating in their care.  A copy of this report was sent to the requesting provider on this date.  Electronically SignedAletta Edouard T 01/19/2016, 5:31 PM   I spent a total of 40 Minutes  in face to face in clinical consultation, greater than 50% of which was counseling/coordinating care for ablation of hepatocellular carcinoma.

## 2016-01-20 ENCOUNTER — Encounter (HOSPITAL_COMMUNITY): Payer: Self-pay | Admitting: Internal Medicine

## 2016-01-29 ENCOUNTER — Other Ambulatory Visit: Payer: Self-pay | Admitting: Interventional Radiology

## 2016-01-29 DIAGNOSIS — C220 Liver cell carcinoma: Secondary | ICD-10-CM

## 2016-02-12 NOTE — Progress Notes (Signed)
Scheduling pre op---please place  SURGICAL/PROCEDURAL ORDERS IN EPIC

## 2016-02-19 ENCOUNTER — Encounter: Payer: Self-pay | Admitting: Interventional Radiology

## 2016-02-22 ENCOUNTER — Other Ambulatory Visit (INDEPENDENT_AMBULATORY_CARE_PROVIDER_SITE_OTHER): Payer: Self-pay | Admitting: Internal Medicine

## 2016-03-08 ENCOUNTER — Other Ambulatory Visit: Payer: Self-pay | Admitting: Radiology

## 2016-03-09 NOTE — Progress Notes (Signed)
Do you have a Hematology Clearance or Medical Clearance for this patient as Platelet count was 51 on 10/26/2015 at [place]? If you have anything , please fax to [facility] at [phone] or [phone]! Thank you!

## 2016-03-09 NOTE — Patient Instructions (Addendum)
Cheryl Velasquez  03/09/2016   Your procedure is scheduled on: Friday 03/11/2016  Report to [facility] Main  Entrance and report to Radiology  at  0830 AM.  Call this number if you have problems the morning of surgery [phone]   Remember: ONLY 1 PERSON MAY GO WITH YOU TO SHORT STAY TO GET  READY MORNING OF [place].   Do not eat food or drink liquids :After Midnight.    How to Manage Your Diabetes            Before and After Surgery  Why is it important to control my blood sugar before and after surgery? . Improving blood sugar levels before and after surgery helps healing and can limit problems. . A way of improving blood sugar control is eating a healthy diet by: o  Eating less sugar and carbohydrates o  Increasing activity/exercise o  Talking with your doctor about reaching your blood sugar goals . High blood sugars (greater than 180 mg/dL) can raise your risk of infections and slow your recovery, so you will need to focus on controlling your diabetes during the weeks before surgery. . Make sure that the doctor who takes care of your diabetes knows about your planned surgery including the date and location.  How do I manage my blood sugar before surgery? . Check your blood sugar at least 4 times a day, starting 2 days before surgery, to make sure that the level is not too high or low. o Check your blood sugar the morning of your surgery when you wake up and every 2 hours until you get to the Short Stay unit. . If your blood sugar is less than 70 mg/dL, you will need to treat for low blood sugar: o Do not take insulin. o Treat a low blood sugar (less than 70 mg/dL) with  cup of clear juice (cranberry or apple), 4 glucose tablets, OR glucose gel. o Recheck blood sugar in 15 minutes after treatment (to make sure it is greater than 70 mg/dL). If your blood sugar is not greater than 70 mg/dL on recheck, call [phone] for further  instructions. . Report your blood sugar to the short stay nurse when you get to Short Stay.  . If you are admitted to the hospital after surgery: o Your blood sugar will be checked by the staff and you will probably be given insulin after surgery (instead of oral diabetes medicines) to make sure you have good blood sugar levels. o The goal for blood sugar control after surgery is 80-180 mg/dL.   WHAT DO I DO ABOUT MY DIABETES MEDICATION?  Marland Kitchen Do not take oral diabetes medicines (pills) the morning of surgery.!       Take these medicines the morning of surgery with A SIP OF WATER: Propanolol (Inderal), Isopto tears eye drops if needed, Pantoprazole (Protonix), use Albuterol inhaler if needed                                 You may not have any metal on your body including hair pins and              piercings  Do not wear jewelry, make-up, lotions, powders or perfumes, deodorant             Do not wear nail polish.  Do not shave  48 hours prior to surgery.              Men may shave face and neck.   Do not bring valuables to the hospital. [place].  Contacts, dentures or bridgework may not be worn into surgery.  Leave suitcase in the car. After surgery it may be brought to your room.                   Please read over the following fact sheets you were given: _____________________________________________________________________             [facility] - Preparing for Surgery Before surgery, you can play an important role.  Because skin is not sterile, your skin needs to be as free of germs as possible.  You can reduce the number of germs on your skin by washing with CHG (chlorahexidine gluconate) soap before surgery.  CHG is an antiseptic cleaner which kills germs and bonds with the skin to continue killing germs even after washing. Please DO NOT use if you have an allergy to CHG or antibacterial soaps.  If your skin becomes  reddened/irritated stop using the CHG and inform your nurse when you arrive at Short Stay. Do not shave (including legs and underarms) for at least 48 hours prior to the first CHG shower.  You may shave your face/neck. Please follow these instructions carefully:  1.  Shower with CHG Soap the night before surgery and the  morning of Surgery.  2.  If you choose to wash your hair, wash your hair first as usual with your  normal  shampoo.  3.  After you shampoo, rinse your hair and body thoroughly to remove the  shampoo.                           4.  Use CHG as you would any other liquid soap.  You can apply chg directly  to the skin and wash                       Gently with a scrungie or clean washcloth.  5.  Apply the CHG Soap to your body ONLY FROM THE NECK DOWN.   Do not use on face/ open                           Wound or open sores. Avoid contact with eyes, ears mouth and genitals (private parts).                       Wash face,  Genitals (private parts) with your normal soap.             6.  Wash thoroughly, paying special attention to the area where your surgery  will be performed.  7.  Thoroughly rinse your body with warm water from the neck down.  8.  DO NOT shower/wash with your normal soap after using and rinsing off  the CHG Soap.                9.  Pat yourself dry with a clean towel.            10.  Wear clean pajamas.  11.  Place clean sheets on your bed the night of your first shower and do not  sleep with pets. Day of Surgery : Do not apply any lotions/deodorants the morning of surgery.  Please wear clean clothes to the hospital/surgery center.  FAILURE TO FOLLOW THESE INSTRUCTIONS MAY RESULT IN THE CANCELLATION OF YOUR SURGERY PATIENT SIGNATURE_________________________________  NURSE SIGNATURE__________________________________  ________________________________________________________________________

## 2016-03-10 ENCOUNTER — Encounter (HOSPITAL_COMMUNITY): Payer: Self-pay

## 2016-03-10 ENCOUNTER — Ambulatory Visit (HOSPITAL_COMMUNITY)
Admission: RE | Admit: 2016-03-10 | Discharge: 2016-03-10 | Disposition: A | Discharge status: Home / Self Care | Payer: Medicare Other | Source: Ambulatory Visit | Attending: Radiology | Admitting: Radiology

## 2016-03-10 ENCOUNTER — Other Ambulatory Visit: Payer: Self-pay | Admitting: Radiology

## 2016-03-10 ENCOUNTER — Ambulatory Visit (HOSPITAL_COMMUNITY): Payer: Medicare Other | Admitting: Hematology & Oncology

## 2016-03-10 ENCOUNTER — Other Ambulatory Visit (HOSPITAL_COMMUNITY): Payer: Medicare Other

## 2016-03-10 ENCOUNTER — Encounter (HOSPITAL_COMMUNITY)
Admission: RE | Admit: 2016-03-10 | Discharge: 2016-03-10 | Disposition: A | Discharge status: Home / Self Care | Payer: Medicare Other | Source: Ambulatory Visit | Attending: Interventional Radiology | Admitting: Interventional Radiology

## 2016-03-10 DIAGNOSIS — Z01812 Encounter for preprocedural laboratory examination: Secondary | ICD-10-CM | POA: Diagnosis not present

## 2016-03-10 DIAGNOSIS — Z01818 Encounter for other preprocedural examination: Secondary | ICD-10-CM | POA: Diagnosis not present

## 2016-03-10 DIAGNOSIS — Z01810 Encounter for preprocedural cardiovascular examination: Secondary | ICD-10-CM | POA: Insufficient documentation

## 2016-03-10 HISTORY — DX: Dyspnea, unspecified: R06.00

## 2016-03-10 LAB — CBC WITH DIFFERENTIAL/PLATELET
BASOS PCT: 0 %
Basophils Absolute: 0 10*3/uL (ref 0.0–0.1)
EOS PCT: 3 %
Eosinophils Absolute: 0.1 10*3/uL (ref 0.0–0.7)
HEMATOCRIT: 35.6 % — AB (ref 36.0–46.0)
Hemoglobin: 12.1 g/dL (ref 12.0–15.0)
LYMPHS ABS: 0.7 10*3/uL (ref 0.7–4.0)
Lymphocytes Relative: 29 %
MCH: 31.7 pg (ref 26.0–34.0)
MCHC: 34 g/dL (ref 30.0–36.0)
MCV: 93.2 fL (ref 78.0–100.0)
MONOS PCT: 13 %
Monocytes Absolute: 0.3 10*3/uL (ref 0.1–1.0)
NEUTROS ABS: 1.2 10*3/uL — AB (ref 1.7–7.7)
Neutrophils Relative %: 55 %
PLATELETS: 54 10*3/uL — AB (ref 150–400)
RBC: 3.82 MIL/uL — ABNORMAL LOW (ref 3.87–5.11)
RDW: 14.7 % (ref 11.5–15.5)
WBC: 2.3 10*3/uL — ABNORMAL LOW (ref 4.0–10.5)

## 2016-03-10 LAB — GLUCOSE, CAPILLARY: Glucose-Capillary: 206 mg/dL — ABNORMAL HIGH (ref 65–99)

## 2016-03-10 LAB — COMPREHENSIVE METABOLIC PANEL
ALT: 24 U/L (ref 14–54)
AST: 37 U/L (ref 15–41)
Albumin: 3.3 g/dL — ABNORMAL LOW (ref 3.5–5.0)
Alkaline Phosphatase: 57 U/L (ref 38–126)
Anion gap: 6 (ref 5–15)
BILIRUBIN TOTAL: 0.9 mg/dL (ref 0.3–1.2)
BUN: 9 mg/dL (ref 6–20)
CHLORIDE: 106 mmol/L (ref 101–111)
CO2: 25 mmol/L (ref 22–32)
Calcium: 9.1 mg/dL (ref 8.9–10.3)
Creatinine, Ser: 0.58 mg/dL (ref 0.44–1.00)
Glucose, Bld: 220 mg/dL — ABNORMAL HIGH (ref 65–99)
POTASSIUM: 4.2 mmol/L (ref 3.5–5.1)
Sodium: 137 mmol/L (ref 135–145)
TOTAL PROTEIN: 6.5 g/dL (ref 6.5–8.1)

## 2016-03-10 LAB — ABO/RH: ABO/RH(D): B POS

## 2016-03-10 LAB — PROTIME-INR
INR: 1.24
PROTHROMBIN TIME: 15.7 s — AB (ref 11.4–15.2)

## 2016-03-10 MED ORDER — PIPERACILLIN SOD-TAZOBACTAM SO 2.25 (2-0.25) G IV SOLR: 3.3750 g | Freq: Once | INTRAVENOUS | Status: DC

## 2016-03-10 NOTE — Progress Notes (Signed)
   03/10/16 1129  OBSTRUCTIVE SLEEP APNEA  Have you ever been diagnosed with sleep apnea through a sleep study? No  Do you snore loudly (loud enough to be heard through closed doors)?  0  Do you often feel tired, fatigued, or sleepy during the daytime (such as falling asleep during driving or talking to someone)? 1  Has anyone observed you stop breathing during your sleep? 0  Do you have, or are you being treated for high blood pressure? 1  BMI more than 35 kg/m2? 1  Age > 50 (1-yes) 1  Neck circumference greater than:Female 16 inches or larger, Female 17inches or larger? 1  Female Gender (Yes=1) 0  Obstructive Sleep Apnea Score 5  Score 5 or greater  Results sent to PCP

## 2016-03-11 ENCOUNTER — Ambulatory Visit (HOSPITAL_COMMUNITY): Payer: Medicare Other | Admitting: Anesthesiology

## 2016-03-11 ENCOUNTER — Ambulatory Visit (HOSPITAL_COMMUNITY): Payer: Medicare Other | Admitting: Hematology & Oncology

## 2016-03-11 ENCOUNTER — Observation Stay (HOSPITAL_COMMUNITY)
Admission: RE | Admit: 2016-03-11 | Discharge: 2016-03-12 | Disposition: A | Discharge status: Home / Self Care | Payer: Medicare Other | Source: Ambulatory Visit | Attending: Interventional Radiology | Admitting: Interventional Radiology

## 2016-03-11 ENCOUNTER — Ambulatory Visit (HOSPITAL_COMMUNITY)
Admission: RE | Admit: 2016-03-11 | Discharge: 2016-03-11 | Disposition: A | Discharge status: Home / Self Care | Payer: Medicare Other | Source: Ambulatory Visit | Attending: Interventional Radiology | Admitting: Interventional Radiology

## 2016-03-11 ENCOUNTER — Encounter (HOSPITAL_COMMUNITY)
Admission: RE | Disposition: A | Discharge status: Home / Self Care | Payer: Self-pay | Source: Ambulatory Visit | Attending: Interventional Radiology

## 2016-03-11 ENCOUNTER — Encounter (HOSPITAL_COMMUNITY): Payer: Self-pay

## 2016-03-11 ENCOUNTER — Other Ambulatory Visit (HOSPITAL_COMMUNITY): Payer: Medicare Other

## 2016-03-11 DIAGNOSIS — Z79899 Other long term (current) drug therapy: Secondary | ICD-10-CM | POA: Diagnosis not present

## 2016-03-11 DIAGNOSIS — E039 Hypothyroidism, unspecified: Secondary | ICD-10-CM | POA: Diagnosis not present

## 2016-03-11 DIAGNOSIS — E785 Hyperlipidemia, unspecified: Secondary | ICD-10-CM | POA: Insufficient documentation

## 2016-03-11 DIAGNOSIS — K7689 Other specified diseases of liver: Secondary | ICD-10-CM | POA: Diagnosis not present

## 2016-03-11 DIAGNOSIS — Z7984 Long term (current) use of oral hypoglycemic drugs: Secondary | ICD-10-CM | POA: Diagnosis not present

## 2016-03-11 DIAGNOSIS — K7460 Unspecified cirrhosis of liver: Secondary | ICD-10-CM | POA: Diagnosis not present

## 2016-03-11 DIAGNOSIS — E119 Type 2 diabetes mellitus without complications: Secondary | ICD-10-CM | POA: Insufficient documentation

## 2016-03-11 DIAGNOSIS — C220 Liver cell carcinoma: Secondary | ICD-10-CM

## 2016-03-11 DIAGNOSIS — K7581 Nonalcoholic steatohepatitis (NASH): Secondary | ICD-10-CM | POA: Insufficient documentation

## 2016-03-11 DIAGNOSIS — M1990 Unspecified osteoarthritis, unspecified site: Secondary | ICD-10-CM | POA: Diagnosis not present

## 2016-03-11 DIAGNOSIS — I10 Essential (primary) hypertension: Secondary | ICD-10-CM | POA: Diagnosis not present

## 2016-03-11 DIAGNOSIS — Z853 Personal history of malignant neoplasm of breast: Secondary | ICD-10-CM | POA: Insufficient documentation

## 2016-03-11 DIAGNOSIS — D490 Neoplasm of unspecified behavior of digestive system: Secondary | ICD-10-CM | POA: Diagnosis not present

## 2016-03-11 HISTORY — PX: RADIOFREQUENCY ABLATION: SHX2290

## 2016-03-11 LAB — TYPE AND SCREEN
ABO/RH(D): B POS
Antibody Screen: NEGATIVE

## 2016-03-11 LAB — CBC WITH DIFFERENTIAL/PLATELET
Basophils Absolute: 0 10*3/uL (ref 0.0–0.1)
Basophils Relative: 1 %
EOS PCT: 1 %
Eosinophils Absolute: 0 10*3/uL (ref 0.0–0.7)
HCT: 36.6 % (ref 36.0–46.0)
Hemoglobin: 12.6 g/dL (ref 12.0–15.0)
LYMPHS ABS: 0.9 10*3/uL (ref 0.7–4.0)
LYMPHS PCT: 24 %
MCH: 31.7 pg (ref 26.0–34.0)
MCHC: 34.4 g/dL (ref 30.0–36.0)
MCV: 92.2 fL (ref 78.0–100.0)
MONO ABS: 0.3 10*3/uL (ref 0.1–1.0)
MONOS PCT: 9 %
Neutro Abs: 2.4 10*3/uL (ref 1.7–7.7)
Neutrophils Relative %: 66 %
PLATELETS: 63 10*3/uL — AB (ref 150–400)
RBC: 3.97 MIL/uL (ref 3.87–5.11)
RDW: 14.4 % (ref 11.5–15.5)
WBC: 3.6 10*3/uL — ABNORMAL LOW (ref 4.0–10.5)

## 2016-03-11 LAB — GLUCOSE, CAPILLARY
GLUCOSE-CAPILLARY: 159 mg/dL — AB (ref 65–99)
GLUCOSE-CAPILLARY: 200 mg/dL — AB (ref 65–99)
Glucose-Capillary: 201 mg/dL — ABNORMAL HIGH (ref 65–99)
Glucose-Capillary: 172 mg/dL — ABNORMAL HIGH (ref 65–99)
Glucose-Capillary: 306 mg/dL — ABNORMAL HIGH (ref 65–99)

## 2016-03-11 LAB — HEMOGLOBIN A1C
HEMOGLOBIN A1C: 6.3 % — AB (ref 4.8–5.6)
MEAN PLASMA GLUCOSE: 134 mg/dL

## 2016-03-11 SURGERY — RADIO FREQUENCY ABLATION
Anesthesia: General

## 2016-03-11 MED ORDER — FUROSEMIDE 20 MG PO TABS
20.0000 mg | ORAL_TABLET | Freq: Every day | ORAL | Status: DC | PRN
  Administered 2016-03-11: 20 mg via ORAL
  Filled 2016-03-11: qty 1

## 2016-03-11 MED ORDER — HYDROMORPHONE HCL 1 MG/ML IJ SOLN
INTRAMUSCULAR | Status: AC
  Administered 2016-03-11: 0.5 mg via INTRAVENOUS
  Filled 2016-03-11: qty 1

## 2016-03-11 MED ORDER — HYDROCODONE-ACETAMINOPHEN 5-325 MG PO TABS
1.0000 | ORAL_TABLET | ORAL | Status: DC | PRN
Start: 2016-03-11 — End: 2016-03-12
  Administered 2016-03-11: 2 via ORAL
  Filled 2016-03-11: qty 2

## 2016-03-11 MED ORDER — LIDOCAINE 2% (20 MG/ML) 5 ML SYRINGE
INTRAMUSCULAR | Status: DC | PRN
  Administered 2016-03-11: 60 mg via INTRAVENOUS

## 2016-03-11 MED ORDER — SODIUM CHLORIDE 0.9 % IV SOLN: Freq: Once | INTRAVENOUS | Status: DC

## 2016-03-11 MED ORDER — IOPAMIDOL (ISOVUE-300) INJECTION 61%
INTRAVENOUS | Status: AC
  Filled 2016-03-11: qty 100

## 2016-03-11 MED ORDER — SODIUM CHLORIDE 0.9 % IV SOLN
INTRAVENOUS | Status: DC
  Administered 2016-03-11: 18:00:00 via INTRAVENOUS

## 2016-03-11 MED ORDER — SIMVASTATIN 40 MG PO TABS
40.0000 mg | ORAL_TABLET | Freq: Every day | ORAL | Status: DC
  Administered 2016-03-12: 40 mg via ORAL
  Filled 2016-03-11: qty 1

## 2016-03-11 MED ORDER — MIDAZOLAM HCL 2 MG/2ML IJ SOLN
INTRAMUSCULAR | Status: AC
  Filled 2016-03-11: qty 2

## 2016-03-11 MED ORDER — HYDROMORPHONE HCL 1 MG/ML IJ SOLN
0.2500 mg | INTRAMUSCULAR | Status: DC | PRN
  Administered 2016-03-11 (×2): 0.5 mg via INTRAVENOUS
  Administered 2016-03-11 (×2): 0.25 mg via INTRAVENOUS

## 2016-03-11 MED ORDER — DEXAMETHASONE SODIUM PHOSPHATE 10 MG/ML IJ SOLN
INTRAMUSCULAR | Status: DC | PRN
  Administered 2016-03-11: 10 mg via INTRAVENOUS

## 2016-03-11 MED ORDER — PANTOPRAZOLE SODIUM 40 MG PO TBEC
40.0000 mg | DELAYED_RELEASE_TABLET | Freq: Every day | ORAL | Status: DC
  Administered 2016-03-12: 40 mg via ORAL
  Filled 2016-03-11: qty 1

## 2016-03-11 MED ORDER — EPHEDRINE SULFATE 50 MG/ML IJ SOLN
INTRAMUSCULAR | Status: DC | PRN
  Administered 2016-03-11: 10 mg via INTRAVENOUS
  Administered 2016-03-11: 5 mg via INTRAVENOUS
  Administered 2016-03-11: 10 mg via INTRAVENOUS

## 2016-03-11 MED ORDER — SUGAMMADEX SODIUM 200 MG/2ML IV SOLN
INTRAVENOUS | Status: DC | PRN
  Administered 2016-03-11: 250 mg via INTRAVENOUS

## 2016-03-11 MED ORDER — CAPTOPRIL 25 MG PO TABS
25.0000 mg | ORAL_TABLET | Freq: Two times a day (BID) | ORAL | Status: DC
  Administered 2016-03-11 – 2016-03-12 (×2): 25 mg via ORAL
  Filled 2016-03-11 (×3): qty 1

## 2016-03-11 MED ORDER — DOCUSATE SODIUM 100 MG PO CAPS
100.0000 mg | ORAL_CAPSULE | Freq: Two times a day (BID) | ORAL | Status: DC
  Administered 2016-03-11 – 2016-03-12 (×2): 100 mg via ORAL
  Filled 2016-03-11 (×5): qty 1

## 2016-03-11 MED ORDER — HYDROMORPHONE HCL 1 MG/ML IJ SOLN
INTRAMUSCULAR | Status: AC
  Administered 2016-03-11: 0.25 mg via INTRAVENOUS
  Filled 2016-03-11: qty 1

## 2016-03-11 MED ORDER — SENNOSIDES-DOCUSATE SODIUM 8.6-50 MG PO TABS
1.0000 | ORAL_TABLET | Freq: Every day | ORAL | Status: DC | PRN
  Filled 2016-03-11: qty 1

## 2016-03-11 MED ORDER — ALBUTEROL SULFATE HFA 108 (90 BASE) MCG/ACT IN AERS
INHALATION_SPRAY | RESPIRATORY_TRACT | Status: AC
  Filled 2016-03-11: qty 6.7

## 2016-03-11 MED ORDER — HYDROMORPHONE HCL 2 MG/ML IJ SOLN: 1.0000 mg | INTRAMUSCULAR | Status: DC | PRN

## 2016-03-11 MED ORDER — INSULIN ASPART 100 UNIT/ML ~~LOC~~ SOLN
4.0000 [IU] | Freq: Once | SUBCUTANEOUS | Status: AC
  Administered 2016-03-11: 4 [IU] via SUBCUTANEOUS

## 2016-03-11 MED ORDER — INSULIN ASPART 100 UNIT/ML ~~LOC~~ SOLN
0.0000 [IU] | Freq: Three times a day (TID) | SUBCUTANEOUS | Status: DC
Start: 2016-03-11 — End: 2016-03-12
  Administered 2016-03-11 – 2016-03-12 (×2): 3 [IU] via SUBCUTANEOUS

## 2016-03-11 MED ORDER — IOPAMIDOL (ISOVUE-300) INJECTION 61%
100.0000 mL | Freq: Once | INTRAVENOUS | Status: AC | PRN
  Administered 2016-03-11: 100 mL via INTRAVENOUS

## 2016-03-11 MED ORDER — ALBUTEROL SULFATE (2.5 MG/3ML) 0.083% IN NEBU
3.0000 mL | INHALATION_SOLUTION | Freq: Four times a day (QID) | RESPIRATORY_TRACT | Status: DC | PRN
  Administered 2016-03-11: 3 mL via RESPIRATORY_TRACT
  Filled 2016-03-11: qty 3

## 2016-03-11 MED ORDER — LACTATED RINGERS IV SOLN
INTRAVENOUS | Status: DC
  Administered 2016-03-11: 09:00:00 via INTRAVENOUS

## 2016-03-11 MED ORDER — SUCCINYLCHOLINE CHLORIDE 200 MG/10ML IV SOSY
PREFILLED_SYRINGE | INTRAVENOUS | Status: DC | PRN
  Administered 2016-03-11: 100 mg via INTRAVENOUS
  Administered 2016-03-11: 20 mg via INTRAVENOUS

## 2016-03-11 MED ORDER — FENTANYL CITRATE (PF) 100 MCG/2ML IJ SOLN
INTRAMUSCULAR | Status: DC | PRN
  Administered 2016-03-11 (×6): 25 ug via INTRAVENOUS

## 2016-03-11 MED ORDER — POLYVINYL ALCOHOL 1.4 % OP SOLN
1.0000 [drp] | Freq: Every day | OPHTHALMIC | Status: DC | PRN
  Filled 2016-03-11: qty 15

## 2016-03-11 MED ORDER — PROPRANOLOL HCL 10 MG PO TABS
20.0000 mg | ORAL_TABLET | Freq: Two times a day (BID) | ORAL | Status: DC
  Administered 2016-03-11 – 2016-03-12 (×2): 20 mg via ORAL
  Filled 2016-03-11 (×2): qty 2

## 2016-03-11 MED ORDER — ALBUTEROL SULFATE HFA 108 (90 BASE) MCG/ACT IN AERS
INHALATION_SPRAY | RESPIRATORY_TRACT | Status: DC | PRN
Start: 2016-03-11 — End: 2016-03-11
  Administered 2016-03-11: 2 via RESPIRATORY_TRACT

## 2016-03-11 MED ORDER — PROPOFOL 10 MG/ML IV BOLUS
INTRAVENOUS | Status: DC | PRN
  Administered 2016-03-11: 120 mg via INTRAVENOUS

## 2016-03-11 MED ORDER — GLIPIZIDE ER 5 MG PO TB24
5.0000 mg | ORAL_TABLET | Freq: Every day | ORAL | Status: DC
  Administered 2016-03-12: 5 mg via ORAL
  Filled 2016-03-11: qty 1

## 2016-03-11 MED ORDER — PROMETHAZINE HCL 25 MG/ML IJ SOLN: 6.2500 mg | INTRAMUSCULAR | Status: DC | PRN

## 2016-03-11 MED ORDER — FENTANYL CITRATE (PF) 100 MCG/2ML IJ SOLN
INTRAMUSCULAR | Status: AC
  Filled 2016-03-11: qty 6

## 2016-03-11 MED ORDER — SODIUM CHLORIDE 0.9 % IV SOLN
Freq: Once | INTRAVENOUS | Status: AC
  Administered 2016-03-11: 10:00:00 via INTRAVENOUS

## 2016-03-11 MED ORDER — ONDANSETRON HCL 4 MG/2ML IJ SOLN: 4.0000 mg | Freq: Four times a day (QID) | INTRAMUSCULAR | Status: DC | PRN

## 2016-03-11 MED ORDER — ONDANSETRON HCL 4 MG/2ML IJ SOLN
INTRAMUSCULAR | Status: DC | PRN
  Administered 2016-03-11: 4 mg via INTRAVENOUS

## 2016-03-11 MED ORDER — PIPERACILLIN-TAZOBACTAM 3.375 G IVPB
3.3750 g | Freq: Three times a day (TID) | INTRAVENOUS | Status: DC
  Administered 2016-03-11: 3.375 g via INTRAVENOUS
  Filled 2016-03-11 (×6): qty 50

## 2016-03-11 MED ORDER — ROCURONIUM BROMIDE 50 MG/5ML IV SOSY
PREFILLED_SYRINGE | INTRAVENOUS | Status: DC | PRN
  Administered 2016-03-11: 20 mg via INTRAVENOUS
  Administered 2016-03-11 (×3): 10 mg via INTRAVENOUS
  Administered 2016-03-11: 30 mg via INTRAVENOUS

## 2016-03-11 MED ORDER — ALPRAZOLAM 0.5 MG PO TABS
0.5000 mg | ORAL_TABLET | Freq: Every day | ORAL | Status: DC | PRN
Start: 2016-03-11 — End: 2016-03-12

## 2016-03-11 NOTE — Anesthesia Postprocedure Evaluation (Addendum)
Anesthesia Post Note  Patient: Cheryl Velasquez  Procedure(s) Performed: Procedure(s) (LRB): LIVER THERMAL ABLATION (N/A)  Patient location during evaluation: PACU Anesthesia Type: General Level of consciousness: awake and alert Pain management: pain level controlled Vital Signs Assessment: post-procedure vital signs reviewed and stable Respiratory status: spontaneous breathing, nonlabored ventilation, respiratory function stable and patient connected to nasal cannula oxygen Cardiovascular status: blood pressure returned to baseline and stable Postop Assessment: no signs of nausea or vomiting Anesthetic complications: no       Last Vitals:  Vitals:   03/11/16 1800 03/11/16 1835  BP: (!) 167/63 (!) 165/72  Pulse: 68 89  Resp: 20 20  Temp: 36.9 C 37.3 C    Last Pain:  Vitals:   03/11/16 1835  TempSrc: Oral  PainSc:                  Idamae Coccia S

## 2016-03-11 NOTE — Procedures (Signed)
Interventional Radiology Procedure Note  Procedure:  CT guided thermal ablation of left lobe hepatocellular carcinoma  Anesthesia:  General  Contrast: 100 mL Isovue 409 IV  Complications: None  Estimated Blood Loss: 10 mL  Anterior left lobe HCC treated with 2 separate NeuWave PR probes for 5 min at 35 W.  Plan:  PACU followed by overnight observation.  Venetia Night. Kathlene Cote, M.D Pager:  [phone]

## 2016-03-11 NOTE — Anesthesia Procedure Notes (Addendum)
Procedure Name: Intubation Date/Time: 03/11/2016 12:25 PM Performed by: West Pugh Pre-anesthesia Checklist: Patient identified, Emergency Drugs available, Suction available, Patient being monitored and Timeout performed Patient Re-evaluated:Patient Re-evaluated prior to inductionOxygen Delivery Method: Circle system utilized Preoxygenation: Pre-oxygenation with 100% oxygen Intubation Type: IV induction Ventilation: Mask ventilation without difficulty and Oral airway inserted - appropriate to patient size Laryngoscope Size: Mac and 4 Grade View: Grade II Tube type: Subglottic suction tube Tube size: 7.5 mm Number of attempts: 1 Airway Equipment and Method: Stylet Placement Confirmation: ETT inserted through vocal cords under direct vision,  positive ETCO2,  CO2 detector and breath sounds checked- equal and bilateral Secured at: 22 cm Tube secured with: Tape Dental Injury: Teeth and Oropharynx as per pre-operative assessment

## 2016-03-11 NOTE — Progress Notes (Signed)
1 unit Platelet pheresis began at 126m/hr x 15 minutes with no reaction then increased to 2035mhr per protocol.

## 2016-03-11 NOTE — Anesthesia Preprocedure Evaluation (Addendum)
Anesthesia Evaluation  Patient identified by MRN, date of birth, ID band Patient awake    Reviewed: Allergy & Precautions, NPO status , Patient's Chart, lab work & pertinent test results  Airway Mallampati: III  TM Distance: <3 FB Neck ROM: Full    Dental no notable dental hx.    Pulmonary shortness of breath, with exertion and lying,    Pulmonary exam normal breath sounds clear to auscultation       Cardiovascular hypertension, Normal cardiovascular exam Rhythm:Regular Rate:Normal     Neuro/Psych negative neurological ROS  negative psych ROS   GI/Hepatic negative GI ROS, (+) Cirrhosis       ,   Endo/Other  diabetesMorbid obesity  Renal/GU negative Renal ROS  negative genitourinary   Musculoskeletal negative musculoskeletal ROS (+)   Abdominal   Peds negative pediatric ROS (+)  Hematology thrombocytopenia   Anesthesia Other Findings   Reproductive/Obstetrics negative OB ROS                            Anesthesia Physical Anesthesia Plan  ASA: III  Anesthesia Plan: General   Post-op Pain Management:    Induction: Intravenous  Airway Management Planned: Oral ETT  Additional Equipment:   Intra-op Plan:   Post-operative Plan: Possible Post-op intubation/ventilation  Informed Consent: I have reviewed the patients History and Physical, chart, labs and discussed the procedure including the risks, benefits and alternatives for the proposed anesthesia with the patient or authorized representative who has indicated his/her understanding and acceptance.   Dental advisory given  Plan Discussed with: CRNA and Surgeon  Anesthesia Plan Comments:        Anesthesia Quick Evaluation

## 2016-03-11 NOTE — Transfer of Care (Signed)
Immediate Anesthesia Transfer of Care Note  Patient: Cheryl Velasquez  Procedure(s) Performed: Procedure(s): LIVER THERMAL ABLATION (N/A)  Patient Location: PACU  Anesthesia Type:General  Level of Consciousness:  sedated, patient cooperative and responds to stimulation  Airway & Oxygen Therapy:Patient Spontanous Breathing and Patient connected to face mask oxgen  Post-op Assessment:  Report given to PACU RN and Post -op Vital signs reviewed and stable  Post vital signs:  Reviewed and stable  Last Vitals:  Vitals:   03/11/16 1054 03/11/16 1500  BP: (!) 187/73 (!) (P) 178/79  Pulse: 73   Resp: 20   Temp: 05.6 C     Complications: No apparent anesthesia complications

## 2016-03-11 NOTE — Progress Notes (Signed)
Patient ID: Cheryl Velasquez, female   DOB: 15-Dec-1940, 76 y.o.   MRN: [id number] Pt doing fairly well; had some RUQ discomfort earlier but currently ok; she does have some wheezing; denies N/V VSS; temp 99.3; O2 sats 95% 2 liters Puncture site RUQ clean and dry,NT, abd soft A/P: s/p thermal ablation of left lobe liver HCC earlier today; for overnight obs; albuterol tx now; cont lasix; IVF at [facility]; check am labs; f/u in IR clinic in 4 weeks  [facility]

## 2016-03-11 NOTE — H&P (Signed)
Referring Physician(s): Rehman,N  Supervising Physician: Aletta Edouard  Patient Status:  [facility] OP TBA  Chief Complaint: Left hepatic lobe mass   Subjective:  Cheryl Velasquez is a 76 y.o. female with a history of cirrhosis secondary to NASH. She is familiar to IR service from prior random liver core biopsy in 2012. She also has remote history of breast cancer in 2005. She has had development of a mass in segment 4a in the left lobe of the liver which has been followed by imaging and shows enlargement, now measuring 2.1 x 3 cm and consistent with hepatocellular carcinoma by LI-RADS criteria on MRI.  AFP is also elevated.She has a history of splenomegaly, thrombocytopenia and prior banding of esophageal varices.  She has no history of recent bleeding, encephalopathy or significant ascites.  She was seen in consultation by Dr. Kathlene Cote on 01/19/16 and deemed an appropriate candidate for thermal ablation of the left hepatic lobe mass. She presents today for the procedure.She currently denies fever, headache, chest pain, cough, abdominal pain, nausea, vomiting or abnormal bleeding. She does have some dyspnea with exertion, occasional back pain. Past Medical History:  Diagnosis Date  . Arthritis   . Cirrhosis ([place])   . Diabetes mellitus   . Dyspnea    shortness of breath with any exertion  . History of breast cancer   . History of chicken pox   . Hyperlipidemia   . Hypertension   . Thyroid disease    Past Surgical History:  Procedure Laterality Date  . ABDOMINAL HYSTERECTOMY  1987  . BREAST BIOPSY  2005  . ESOPHAGEAL BANDING N/A 12/13/2012   Procedure: ESOPHAGEAL BANDING;  Surgeon: Rogene Houston, MD;  Location: [facility];  Service: Endoscopy;  Laterality: N/A;  . ESOPHAGOGASTRODUODENOSCOPY N/A 09/20/2012   Procedure: ESOPHAGOGASTRODUODENOSCOPY (EGD);  Surgeon: Rogene Houston, MD;  Location: [facility];  Service: Endoscopy;  Laterality: N/A;  125-moved to 12:00 Ann notified pt  .  ESOPHAGOGASTRODUODENOSCOPY N/A 12/13/2012   Procedure: ESOPHAGOGASTRODUODENOSCOPY (EGD);  Surgeon: Rogene Houston, MD;  Location: [facility];  Service: Endoscopy;  Laterality: N/A;  155-moved to 220 Ann to notify pt  . ESOPHAGOGASTRODUODENOSCOPY N/A 01/11/2016   Procedure: ESOPHAGOGASTRODUODENOSCOPY (EGD);  Surgeon: Rogene Houston, MD;  Location: [facility];  Service: Endoscopy;  Laterality: N/A;  7:30  . EYE SURGERY     bilateral cataract surgery with lens implants  . HERNIA REPAIR  2004  . IR GENERIC HISTORICAL  01/19/2016   IR RADIOLOGIST EVAL & MGMT 01/19/2016 Aletta Edouard, MD [facility] INTERV RAD  . thyroid treatment     radioactive iodine treatment     Allergies: Patient has no known allergies.  Medications: Prior to Admission medications   Medication Sig Start Date End Date Taking? Authorizing Provider  albuterol (PROAIR HFA) 108 (90 BASE) MCG/ACT inhaler Inhale 2 puffs into the lungs every 6 (six) hours as needed for wheezing or shortness of breath.   Yes Historical Provider, MD  ALPRAZolam Duanne Moron) 0.5 MG tablet Take 0.5 mg by mouth daily as needed for anxiety.    Yes Historical Provider, MD  Calcium Carbonate-Vitamin D (CALTRATE 600+D) 600-400 MG-UNIT per tablet Take 1 tablet by mouth daily.     Yes Historical Provider, MD  captopril (CAPOTEN) 25 MG tablet Take 25 mg by mouth 2 (two) times daily.     Yes Historical Provider, MD  furosemide (LASIX) 20 MG tablet Take 20 mg by mouth daily as needed for edema.  10/07/13  Yes Historical Provider, MD  glipiZIDE (GLUCOTROL XL) 5 MG 24 hr tablet Take 5 mg by mouth daily.     Yes Historical Provider, MD  hydroxypropyl methylcellulose (ISOPTO TEARS) 2.5 % ophthalmic solution Place 1 drop into both eyes daily as needed (Dry Eyes).   Yes Historical Provider, MD  metFORMIN (GLUCOPHAGE-XR) 500 MG 24 hr tablet Take 1,000 mg by mouth 2 (two) times daily.    Yes Historical Provider, MD  Multiple Vitamins-Minerals (CENTRUM SILVER PO) Take 1  tablet by mouth daily.     Yes Historical Provider, MD  pantoprazole (PROTONIX) 40 MG tablet TAKE 1 TABLET BY MOUTH EVERY DAY Patient taking differently: Take 82ms daily 11/24/15  Yes TButch Penny NP  propranolol (INDERAL) 20 MG tablet TAKE 1 TABLET BY MOUTH TWO TIMES DAILY Patient taking differently: Take 20 mgs by mouth twice daily 02/22/16  Yes NRogene Houston MD  simvastatin (ZOCOR) 40 MG tablet Take 40 mg by mouth daily.     Yes Historical Provider, MD  vitamin C (ASCORBIC ACID) 500 MG tablet Take 500 mg by mouth daily.   Yes Historical Provider, MD     Vital Signs: Vitals:   03/11/16 1030  BP: (!) 144/59  Pulse: 69  Resp: 18  Temp: 98.7 F (37.1 C)     Physical Exam Patient awake, alert. Chest clear to auscultation bilaterally. Heart with normal rate, occasional ectopy noted. Abdomen obese, soft, slightly distended, few bowel sounds nontender; lower extremities with trace edema.  Imaging: Dg Chest 2 View  Result Date: 03/10/2016 CLINICAL DATA:  Preop. EXAM: CHEST  2 VIEW COMPARISON:  None. FINDINGS: Heart is upper limits normal in size. Lungs are clear. No effusions. No acute bony abnormality. IMPRESSION: No active cardiopulmonary disease. Electronically Signed   By: KRolm BaptiseM.D.   On: 03/10/2016 13:55    Labs:  CBC:  Recent Labs  09/09/15 1600 03/10/16 1100 03/11/16 0916  WBC 3.3* 2.3* 3.6*  HGB 12.2 12.1 12.6  HCT 35.5* 35.6* 36.6  PLT 69* 54* 63*    COAGS:  Recent Labs  03/10/16 1100  INR 1.24    BMP:  Recent Labs  06/30/15 1352 09/09/15 1600 01/18/16 1635 03/10/16 1100  NA  --  136  --  137  K  --  4.3  --  4.2  CL  --  103  --  106  CO2  --  25  --  25  GLUCOSE  --  341*  --  220*  BUN  --  8  --  9  CALCIUM  --  8.5*  --  9.1  CREATININE 0.67 0.75 0.79 0.58  GFRNONAA >60 >60 >60 >60  GFRAA >60 >60 >60 >60    LIVER FUNCTION TESTS:  Recent Labs  09/09/15 1600 12/18/15 1129 03/10/16 1100  BILITOT 1.1 1.3* 0.9  AST 35 32  37  ALT 25 22 24   ALKPHOS 72 59 57  PROT 7.2 6.6 6.5  ALBUMIN 3.5 3.5* 3.3*    Assessment and Plan:  76y.o. female with a history of cirrhosis secondary to NASH. She is familiar to IR service from prior random liver core biopsy in 2012. She also has remote history of breast cancer in 2005. She has had development of a mass in segment 4a in the left lobe of the liver which has been followed by imaging and shows enlargement, now measuring 2.1 x 3 cm and consistent with hepatocellular carcinoma by LI-RADS criteria on MRI.  AFP is also elevated.She has a history of splenomegaly, thrombocytopenia and prior banding of esophageal varices.  She has no history of recent bleeding, encephalopathy or significant ascites.  She was seen in consultation by Dr. Kathlene Cote on 01/19/16 and deemed an appropriate candidate for thermal ablation of the left hepatic lobe mass. She presents today for the procedure.Details/risks of procedure, including but not limited to, internal bleeding, infection, injury to adjacent structures, anesthesia-related complications, discussed with patient and niece with their understanding and consent. Post procedure she will be admitted for overnight observation. She will also be receiving platelet transfusion this a.m. prior to procedure.   Electronically Signed: D. Rowe Robert 03/11/2016, 10:26 AM   I spent a total of 30 minutes at the the patient's bedside AND on the patient's hospital floor or unit, greater than 50% of which was counseling/coordinating care for CT-guided thermal ablation of left hepatic lobe mass

## 2016-03-11 NOTE — Progress Notes (Signed)
Dr. Kathlene Cote paged in regards to [id number] an no HS coverage ordered. Awaiting return page.

## 2016-03-12 DIAGNOSIS — I10 Essential (primary) hypertension: Secondary | ICD-10-CM | POA: Diagnosis not present

## 2016-03-12 DIAGNOSIS — C220 Liver cell carcinoma: Secondary | ICD-10-CM | POA: Diagnosis not present

## 2016-03-12 DIAGNOSIS — K7581 Nonalcoholic steatohepatitis (NASH): Secondary | ICD-10-CM | POA: Diagnosis not present

## 2016-03-12 DIAGNOSIS — E785 Hyperlipidemia, unspecified: Secondary | ICD-10-CM | POA: Diagnosis not present

## 2016-03-12 DIAGNOSIS — E119 Type 2 diabetes mellitus without complications: Secondary | ICD-10-CM | POA: Diagnosis not present

## 2016-03-12 DIAGNOSIS — K7460 Unspecified cirrhosis of liver: Secondary | ICD-10-CM | POA: Diagnosis not present

## 2016-03-12 LAB — COMPREHENSIVE METABOLIC PANEL
ALBUMIN: 3.4 g/dL — AB (ref 3.5–5.0)
ALT: 50 U/L (ref 14–54)
ANION GAP: 8 (ref 5–15)
AST: 115 U/L — AB (ref 15–41)
Alkaline Phosphatase: 55 U/L (ref 38–126)
BUN: 12 mg/dL (ref 6–20)
CHLORIDE: 104 mmol/L (ref 101–111)
CO2: 25 mmol/L (ref 22–32)
Calcium: 8.4 mg/dL — ABNORMAL LOW (ref 8.9–10.3)
Creatinine, Ser: 0.75 mg/dL (ref 0.44–1.00)
GFR calc Af Amer: >60 mL/min (ref >60)
GFR calc non Af Amer: >60 mL/min (ref >60)
GLUCOSE: 202 mg/dL — AB (ref 65–99)
POTASSIUM: 3.7 mmol/L (ref 3.5–5.1)
Sodium: 137 mmol/L (ref 135–145)
TOTAL PROTEIN: 6.6 g/dL (ref 6.5–8.1)
Total Bilirubin: 1.1 mg/dL (ref 0.3–1.2)

## 2016-03-12 LAB — CBC
HEMATOCRIT: 37 % (ref 36.0–46.0)
HEMOGLOBIN: 12.7 g/dL (ref 12.0–15.0)
MCH: 32 pg (ref 26.0–34.0)
MCHC: 34.3 g/dL (ref 30.0–36.0)
MCV: 93.2 fL (ref 78.0–100.0)
Platelets: 78 10*3/uL — ABNORMAL LOW (ref 150–400)
RBC: 3.97 MIL/uL (ref 3.87–5.11)
RDW: 14.6 % (ref 11.5–15.5)
WBC: 4.4 10*3/uL (ref 4.0–10.5)

## 2016-03-12 NOTE — Progress Notes (Signed)
Patient discharged to home, all discharge medications and instructions reviewed and questions answered.  Patient to be assisted to vehicle by wheelchair.  

## 2016-03-12 NOTE — Discharge Instructions (Signed)
Radiofrequency Ablation of Liver Tumors, Care After Refer to this sheet in the next few weeks. These instructions provide you with information on caring for yourself after your procedure. Your health care provider may also give you more specific instructions. Your treatment has been planned according to current medical practices, but problems sometimes occur. Call your health care provider if you have any problems or questions after your procedure.  WHAT TO EXPECT AFTER THE PROCEDURE After your procedure, you may have pain and discomfort in the upper abdomen. You will be given pain medicines to control this.  HOME CARE INSTRUCTIONS  Take all medicines as directed by your health care provider.  Follow diet instructions as directed by your health care provider.  Follow instructions regarding rest and physical activity. SEEK MEDICAL CARE IF:  You cannot pass gas.   You cannot have a bowel movement within 2 days.   You have a skin rash.   You have a fever. SEEK IMMEDIATE MEDICAL CARE IF:  You have severe or lasting abdominal pain or pain in your shoulder or back.   You have trouble swallowing or breathing.   You have severe weakness or dizziness.   You have chest pain or shortness of breath. This information is not intended to replace advice given to you by your health care provider. Make sure you discuss any questions you have with your health care provider. Document Released: 11/21/2012 Document Revised: 02/05/2013 Document Reviewed: 11/21/2012 Elsevier Interactive Patient Education  2017 [employer].

## 2016-03-12 NOTE — Discharge Summary (Signed)
   Patient ID: AURELIE DICENZO MRN: [id number] DOB/AGE: [birth year] 76 y.o.  Admit date: 03/11/2016 Discharge date: 03/12/2016  Supervising Physician: Markus Daft  Admission Diagnoses: hepatocellular carcinoma  Discharge Diagnoses:  Active Problems:   Hepatocellular carcinoma [facility])   Discharged Condition: good  Hospital Course: the patient was admitted and underwent a thermal ablation of her [place].  She tolerated the procedure well.  The following day her pain was controlled, she was voiding well, she was tolerating a regular diet with no issues.  She was stable for dc home.    Consults: None  Discharge Exam: Blood pressure (!) 154/97, pulse (!) 54, temperature 97.7 F (36.5 C), temperature source Oral, resp. rate 16, SpO2 99 %. General appearance: alert, cooperative and no distress Resp: clear to auscultation bilaterally Cardio: regular rate and rhythm GI: soft, non-tender; bowel sounds normal; no masses,  no organomegaly  Puncture site is c/d/i with no evidence of bleeding of infection  Disposition: 01-Home or Self Care   Allergies as of 03/12/2016   No Known Allergies     Medication List    TAKE these medications   ALPRAZolam 0.5 MG tablet Commonly known as:  XANAX Take 0.5 mg by mouth daily as needed for anxiety.   CALTRATE 600+D 600-400 MG-UNIT tablet Generic drug:  Calcium Carbonate-Vitamin D Take 1 tablet by mouth daily.   captopril 25 MG tablet Commonly known as:  CAPOTEN Take 25 mg by mouth 2 (two) times daily.   CENTRUM SILVER PO Take 1 tablet by mouth daily.   furosemide 20 MG tablet Commonly known as:  LASIX Take 20 mg by mouth daily as needed for edema.   glipiZIDE 5 MG 24 hr tablet Commonly known as:  GLUCOTROL XL Take 5 mg by mouth daily.   hydroxypropyl methylcellulose / hypromellose 2.5 % ophthalmic solution Commonly known as:  ISOPTO TEARS / GONIOVISC Place 1 drop into both eyes daily as needed (Dry Eyes).   metFORMIN 500 MG 24 hr  tablet Commonly known as:  GLUCOPHAGE-XR Take 1,000 mg by mouth 2 (two) times daily.   pantoprazole 40 MG tablet Commonly known as:  PROTONIX TAKE 1 TABLET BY MOUTH EVERY DAY What changed:  See the new instructions.   PROAIR HFA 108 (90 Base) MCG/ACT inhaler Generic drug:  albuterol Inhale 2 puffs into the lungs every 6 (six) hours as needed for wheezing or shortness of breath.   propranolol 20 MG tablet Commonly known as:  INDERAL TAKE 1 TABLET BY MOUTH TWO TIMES DAILY What changed:  See the new instructions.   simvastatin 40 MG tablet Commonly known as:  ZOCOR Take 40 mg by mouth daily.   vitamin C 500 MG tablet Commonly known as:  ASCORBIC ACID Take 500 mg by mouth daily.      Follow-up Information    YAMAGATA,GLENN T, MD Follow up in 1 month(s).   Specialty:  Interventional Radiology Why:  our office will call you with appointment date and time Contact information: [address] [place] [address] [phone]            Electronically Signed: Henreitta Cea 03/12/2016, 9:34 AM   I have spent Less Than 30 Minutes discharging Darrin Luis.

## 2016-03-14 LAB — PREPARE PLATELET PHERESIS
Unit division: 0
Unit division: 0

## 2016-03-14 LAB — GLUCOSE, CAPILLARY: Glucose-Capillary: 198 mg/dL — ABNORMAL HIGH (ref 65–99)

## 2016-03-29 ENCOUNTER — Ambulatory Visit
Admission: RE | Admit: 2016-03-29 | Discharge: 2016-03-29 | Disposition: A | Discharge status: Home / Self Care | Payer: Medicare Other | Source: Ambulatory Visit | Attending: General Surgery | Admitting: General Surgery

## 2016-03-29 ENCOUNTER — Ambulatory Visit: Payer: Medicare Other | Admitting: Endocrinology

## 2016-03-29 ENCOUNTER — Encounter: Payer: Self-pay | Admitting: Radiology

## 2016-03-29 DIAGNOSIS — C220 Liver cell carcinoma: Secondary | ICD-10-CM | POA: Diagnosis not present

## 2016-03-29 HISTORY — PX: IR GENERIC HISTORICAL: IMG1180011

## 2016-03-29 NOTE — Progress Notes (Signed)
Chief Complaint: F/U after thermal ablation of [facility]  Referring Physician(s): Rehman,N  Supervising Physician: Aletta Edouard  History of Present Illness: Cheryl Velasquez is a 76 y.o. female with a history of cirrhosis secondary to NASH.   She is familiar to IR service from prior random liver core biopsy in 2012.   She also has remote history of breast cancer in 2005.  She had development of a mass in segment 4a in the left lobe of the liver which had been followed by imaging and showed enlargement to 2.1 x 3 cm and was consistent with hepatocellular carcinoma by LI-RADS criteria on MRI.   She has a history of splenomegaly, thrombocytopenia and prior banding of esophageal varices.   She has no history of recent bleeding, encephalopathy or significant ascites.   She was seen in consultation by Dr. Kathlene Cote on 01/19/16 and deemed an appropriate candidate for thermal ablation of the left hepatic lobe mass.   She underwent the procedure on 03/08/2016.  She did pretty well post-operatively except she did have some pain in the RUQ. This was due to the location of the mass (sub-capsular)  She continues to have some mild soreness there but she states it is improving.  She denies any recent fever/chills/flu symptoms.  Past Medical History:  Diagnosis Date  . Arthritis   . Cirrhosis ([place])   . Diabetes mellitus   . Dyspnea    shortness of breath with any exertion  . History of breast cancer   . History of chicken pox   . Hyperlipidemia   . Hypertension   . Thyroid disease     Past Surgical History:  Procedure Laterality Date  . ABDOMINAL HYSTERECTOMY  1987  . BREAST BIOPSY  2005  . ESOPHAGEAL BANDING N/A 12/13/2012   Procedure: ESOPHAGEAL BANDING;  Surgeon: Rogene Houston, MD;  [facility];  Service: Endoscopy;  Laterality: N/A;  . ESOPHAGOGASTRODUODENOSCOPY N/A 09/20/2012   Procedure: ESOPHAGOGASTRODUODENOSCOPY (EGD);  Surgeon: Rogene Houston, MD;   [facility];  Service: Endoscopy;  Laterality: N/A;  125-moved to 12:00 Ann notified pt  . ESOPHAGOGASTRODUODENOSCOPY N/A 12/13/2012   Procedure: ESOPHAGOGASTRODUODENOSCOPY (EGD);  Surgeon: Rogene Houston, MD;  [facility];  Service: Endoscopy;  Laterality: N/A;  155-moved to 220 Ann to notify pt  . ESOPHAGOGASTRODUODENOSCOPY N/A 01/11/2016   Procedure: ESOPHAGOGASTRODUODENOSCOPY (EGD);  Surgeon: Rogene Houston, MD;  [facility];  Service: Endoscopy;  Laterality: N/A;  7:30  . EYE SURGERY     bilateral cataract surgery with lens implants  . HERNIA REPAIR  2004  . IR GENERIC HISTORICAL  01/19/2016   IR RADIOLOGIST EVAL & MGMT 01/19/2016 Aletta Edouard, MD [facility] INTERV RAD  . thyroid treatment     radioactive iodine treatment    Allergies: Patient has no known allergies.  Medications: Prior to Admission medications   Medication Sig Start Date End Date Taking? Authorizing Provider  albuterol (PROAIR HFA) 108 (90 BASE) MCG/ACT inhaler Inhale 2 puffs into the lungs every 6 (six) hours as needed for wheezing or shortness of breath.    Historical Provider, MD  ALPRAZolam Duanne Moron) 0.5 MG tablet Take 0.5 mg by mouth daily as needed for anxiety.     Historical Provider, MD  Calcium Carbonate-Vitamin D (CALTRATE 600+D) 600-400 MG-UNIT per tablet Take 1 tablet by mouth daily.      Historical Provider, MD  captopril (CAPOTEN) 25 MG tablet Take 25 mg by mouth 2 (two) times  daily.      Historical Provider, MD  furosemide (LASIX) 20 MG tablet Take 20 mg by mouth daily as needed for edema.  10/07/13   Historical Provider, MD  glipiZIDE (GLUCOTROL XL) 5 MG 24 hr tablet Take 5 mg by mouth daily.      Historical Provider, MD  hydroxypropyl methylcellulose (ISOPTO TEARS) 2.5 % ophthalmic solution Place 1 drop into both eyes daily as needed (Dry Eyes).    Historical Provider, MD  metFORMIN (GLUCOPHAGE-XR) 500 MG 24 hr tablet Take 1,000 mg by mouth 2 (two) times daily.      Historical Provider, MD  Multiple Vitamins-Minerals (CENTRUM SILVER PO) Take 1 tablet by mouth daily.      Historical Provider, MD  pantoprazole (PROTONIX) 40 MG tablet TAKE 1 TABLET BY MOUTH EVERY DAY Patient taking differently: Take 66ms daily 11/24/15   TButch Penny NP  propranolol (INDERAL) 20 MG tablet TAKE 1 TABLET BY MOUTH TWO TIMES DAILY Patient taking differently: Take 20 mgs by mouth twice daily 02/22/16   NRogene Houston MD  simvastatin (ZOCOR) 40 MG tablet Take 40 mg by mouth daily.      Historical Provider, MD  vitamin C (ASCORBIC ACID) 500 MG tablet Take 500 mg by mouth daily.    Historical Provider, MD     Family History  Problem Relation Age of Onset  . Dementia Sister   . Dementia Sister   . Alzheimer's disease Sister   . Liver disease Brother   . Cancer Other     Colon Cancer, Prostate Cancer-Other Blood Relative  . Hyperlipidemia Other     Other Blood Relative  . Stroke Other     Parent, Other Blood Relative  . Diabetes Other     Parent, Other Blood Relative  . Hypertension Other     Parent  . Prostate cancer Maternal Aunt   . Dementia Maternal Aunt     Social History   Social History  . Marital status: Single    Spouse name: N/A  . Number of children: 0  . Years of education: 10   Occupational History  . Textiles    Social History Main Topics  . Smoking status: Never Smoker  . Smokeless tobacco: Never Used  . Alcohol use No  . Drug use: No  . Sexual activity: No   Other Topics Concern  . Not on file   Social History Narrative   Regular Exercise-no     Review of Systems: A 12 point ROS discussed  Review of Systems  Constitutional: Negative.   HENT: Negative.   Respiratory: Negative.   Cardiovascular: Negative.   Gastrointestinal: Negative.   Genitourinary: Negative.   Musculoskeletal: Negative.   Skin: Negative.   Neurological: Negative.   Hematological: Negative.   Psychiatric/Behavioral: Negative.     Vital Signs: BP (!)  158/67 (BP Location: Left Arm, Patient Position: Sitting, Cuff Size: Normal)   Pulse 74   Temp 97.8 F (36.6 C) (Oral)   Resp 17   Ht 5' 2"  (1.575 m)   Wt 235 lb (106.6 kg)   SpO2 98%   BMI 42.98 kg/m   Physical Exam  Constitutional: She is oriented to person, place, and time.  Obese, NAD  HENT:  Head: Normocephalic and atraumatic.  Eyes: EOM are normal.  Neck: Normal range of motion.  Cardiovascular: Normal rate, regular rhythm and normal heart sounds.   Pulmonary/Chest: Effort normal and breath sounds normal. No respiratory distress. She has no wheezes.  Abdominal: Soft. There is no tenderness.    Protuberant abdomen. Some mild ecchymosis in the RUQ which is green/yellowish and appears to be resolving.  Musculoskeletal: Normal range of motion.  Neurological: She is alert and oriented to person, place, and time.  Skin: Skin is warm and dry.  Psychiatric: She has a normal mood and affect. Her behavior is normal. Judgment and thought content normal.  Vitals reviewed.   Imaging: Dg Chest 2 View  Result Date: 03/10/2016 CLINICAL DATA:  Preop. EXAM: CHEST  2 VIEW COMPARISON:  None. FINDINGS: Heart is upper limits normal in size. Lungs are clear. No effusions. No acute bony abnormality. IMPRESSION: No active cardiopulmonary disease. Electronically Signed   By: Rolm Baptise M.D.   On: 03/10/2016 13:55   Ct Guide Tissue Ablation  Result Date: 03/11/2016 CLINICAL DATA:  History of cirrhosis with development of 3 cm mass in segment 4a of the left lobe of the liver. The mass is consistent with hepatocellular carcinoma by MRI criteria and AFP is elevated. She now presents for percutaneous thermal ablation of the hepatocellular carcinoma. EXAM: CT-GUIDED PERCUTANEOUS THERMAL ABLATION OF HEPATOCELLULAR CARCINOMA OF THE LIVER COMPARISON:  MRI of the abdomen on 01/18/2016 ANESTHESIA/SEDATION: Anesthesia:  General Medications: 3.375 g IV Zosyn The antibiotic was administered in an appropriate  time interval prior to needle puncture of the skin. CONTRAST:  100 mL Isovue-300 IV PROCEDURE: The procedure, risks, benefits, and alternatives were explained to the patient. Questions regarding the procedure were encouraged and answered. The patient understands and consents to the procedure. A time-out was performed prior to initiating the procedure. The patient was placed under general anesthesia. Initial unenhanced CT was performed in a supine position to localize the liver. The anterior abdominal wall was prepped with chlorhexidine in a sterile fashion, and a sterile drape was applied covering the operative field. A sterile gown and sterile gloves were used for the procedure. Under CT guidance, 2 separate NeuWave PR probes were advanced into the liver bracketing the subcapsular tumor. Probe positioning was confirmed by CT prior to ablation. Thermal ablation was performed through the 2 probes simultaneously. A 5 minute cycle of ablation was performed at 65 watts. Post-procedural CT was performed. Both probes were removed as the tracts were cauterized up to the surface of the liver. COMPLICATIONS: None FINDINGS: The subcapsular left lobe tumor in segment 4a was not visible by unenhanced CT. Ultrasound was also utilized which is unable to accurately localize the lesion. After contrast administration, arterial phase CT demonstrates the lesion which measures approximately 2.8 cm in transverse diameter. This allowed placement of ablation probes bracketing both sides of the tumor. IMPRESSION: CT guided percutaneous thermal ablation of hepatocellular carcinoma. The patient will be observed overnight. Initial follow-up will be performed in approximately 4 weeks. Electronically Signed   By: Aletta Edouard M.D.   On: 03/11/2016 16:02    Labs:  CBC:  Recent Labs  09/09/15 1600 03/10/16 1100 03/11/16 0916 03/12/16 0750  WBC 3.3* 2.3* 3.6* 4.4  HGB 12.2 12.1 12.6 12.7  HCT 35.5* 35.6* 36.6 37.0  PLT 69* 54*  63* 78*    COAGS:  Recent Labs  03/10/16 1100  INR 1.24    BMP:  Recent Labs  09/09/15 1600 01/18/16 1635 03/10/16 1100 03/12/16 0750  NA 136  --  137 137  K 4.3  --  4.2 3.7  CL 103  --  106 104  CO2 25  --  25 25  GLUCOSE 341*  --  220* 202*  BUN 8  --  9 12  CALCIUM 8.5*  --  9.1 8.4*  CREATININE 0.75 0.79 0.58 0.75  GFRNONAA >60 >60 >60 >60  GFRAA >60 >60 >60 >60    LIVER FUNCTION TESTS:  Recent Labs  09/09/15 1600 12/18/15 1129 03/10/16 1100 03/12/16 0750  BILITOT 1.1 1.3* 0.9 1.1  AST 35 32 37 115*  ALT 25 22 24  50  ALKPHOS 72 59 57 55  PROT 7.2 6.6 6.5 6.6  ALBUMIN 3.5 3.5* 3.3* 3.4*    TUMOR MARKERS:  Recent Labs  06/16/15 1609 09/14/15 0831 12/18/15 1130  AFPTM 22.7* 22.0* 28.5*    Assessment:  History of a mass in segment 4a in the left lobe of the liver which had been followed by imaging and showed enlargement to 2.1 x 3 cm and was consistent with hepatocellular carcinoma by LI-RADS criteria on MRI.  Doing well.  RUQ pain is resolving.  Since Ms Campanile has had some difficulties with MRI (motion artifact) and mass could be visualized on CT scan, will obtain CT with IV contrast on or around March 26 (2 months post procedure).  She inquired about having a mammogram and I assured her it was Ok to proceed, just to let the Mammography techs know that she may be a little sore in her RUQ rib area.   Electronically Signed: Murrell Redden PA-C 03/29/2016, 2:27 PM   Please refer to Dr. Margaretmary Dys attestation of this note for management and plan.

## 2016-04-05 ENCOUNTER — Ambulatory Visit (INDEPENDENT_AMBULATORY_CARE_PROVIDER_SITE_OTHER): Payer: Medicare Other | Admitting: Internal Medicine

## 2016-04-05 ENCOUNTER — Encounter (INDEPENDENT_AMBULATORY_CARE_PROVIDER_SITE_OTHER): Payer: Self-pay | Admitting: Internal Medicine

## 2016-04-05 VITALS — BP 138/68 | HR 74 | Temp 98.0°F | Resp 18 | Ht 63.0 in | Wt 227.9 lb

## 2016-04-05 DIAGNOSIS — K7460 Unspecified cirrhosis of liver: Secondary | ICD-10-CM | POA: Diagnosis not present

## 2016-04-05 DIAGNOSIS — C220 Liver cell carcinoma: Secondary | ICD-10-CM

## 2016-04-05 NOTE — Progress Notes (Signed)
Presenting complaint;  Follow-up for cirrhosis and HCC.  Database and Subjective:  Patient is 3 76-year-old Caucasian female who was cirrhosis secondary to NASH, gated by esophageal varices which have been banded in the past. Last EGD was on 01/11/2016 and she did not require banding. She was also found to have small [place] on routine screening. She underwent thermal ablation by  Dr. Aletta Edouard on 03/11/2016. She was given a platelet transfusion prior to the procedure. She was observed overnight and did well other than pain evening of procedure. She feels fine. She is very happy to tell you that she has lost 9 pounds since her last visit. He is trying to walk more. She denies nausea vomiting abdominal pain melena or rectal bleeding. She is scheduled for follow-up CT by Dr. Kathlene Cote next month.   Current Medications: Outpatient Encounter Prescriptions as of 04/05/2016  Medication Sig  . albuterol (PROAIR HFA) 108 (90 BASE) MCG/ACT inhaler Inhale 2 puffs into the lungs every 6 (six) hours as needed for wheezing or shortness of breath.  . ALPRAZolam (XANAX) 0.5 MG tablet Take 0.5 mg by mouth daily as needed for anxiety.   . Calcium Carbonate-Vitamin D (CALTRATE 600+D) 600-400 MG-UNIT per tablet Take 1 tablet by mouth daily.    . captopril (CAPOTEN) 25 MG tablet Take 25 mg by mouth 2 (two) times daily.    . furosemide (LASIX) 20 MG tablet Take 20 mg by mouth daily as needed for edema.   Marland Kitchen glipiZIDE (GLUCOTROL XL) 5 MG 24 hr tablet Take 5 mg by mouth daily.    . hydroxypropyl methylcellulose (ISOPTO TEARS) 2.5 % ophthalmic solution Place 1 drop into both eyes daily as needed (Dry Eyes).  . metFORMIN (GLUCOPHAGE-XR) 500 MG 24 hr tablet Take 1,000 mg by mouth 2 (two) times daily.   . Multiple Vitamins-Minerals (CENTRUM SILVER PO) Take 1 tablet by mouth daily.    . pantoprazole (PROTONIX) 40 MG tablet TAKE 1 TABLET BY MOUTH EVERY DAY (Patient taking differently: Take 21ms daily)  . propranolol  (INDERAL) 20 MG tablet TAKE 1 TABLET BY MOUTH TWO TIMES DAILY (Patient taking differently: Take 20 mgs by mouth twice daily)  . simvastatin (ZOCOR) 40 MG tablet Take 40 mg by mouth daily.    . vitamin C (ASCORBIC ACID) 500 MG tablet Take 500 mg by mouth daily.   No facility-administered encounter medications on file as of 04/05/2016.      Objective: Blood pressure 138/68, pulse 74, temperature 98 F (36.7 C), temperature source Oral, resp. rate 18, height 5' 3"  (1.6 m), weight 227 lb 14.4 oz (103.4 kg). Patient is alert and in no acute distress. Asterixis absent. Conjunctiva is pink. Sclera is nonicteric Oropharyngeal mucosa is normal. No neck masses or thyromegaly noted. Cardiac exam with regular rhythm normal S1 and S2. No murmur or gallop noted. Lungs are clear to auscultation. Abdomen is protuberant. Liver edge is indistinct below RCM. Spleen is not palpable. Shifting dullness is absent. No LE edema or clubbing noted.  Labs/studies Results: AFP was 28.5 on 1November 29, 2017    Assessment:  #1. Cirrhosis secondary to NASH complicated by: #2. Esophageal varices. He did not work banding on her last session in November 2017. #3. Small HCC. She underwent thermal ablation 4 weeks ago.   Plan:  Patient will go to the lab for AFP. Next CT in one month as recommended by Dr. YKathlene Cote Office visit in 6 months.

## 2016-04-05 NOTE — Patient Instructions (Signed)
Physician will call with results of blood test when completed.

## 2016-04-06 LAB — AFP TUMOR MARKER: AFP TUMOR MARKER: 25.4 ng/mL — AB (ref <6.1)

## 2016-04-07 ENCOUNTER — Other Ambulatory Visit (INDEPENDENT_AMBULATORY_CARE_PROVIDER_SITE_OTHER): Payer: Self-pay | Admitting: *Deleted

## 2016-04-07 DIAGNOSIS — Z1231 Encounter for screening mammogram for malignant neoplasm of breast: Secondary | ICD-10-CM | POA: Diagnosis not present

## 2016-04-07 DIAGNOSIS — C220 Liver cell carcinoma: Secondary | ICD-10-CM

## 2016-04-18 ENCOUNTER — Other Ambulatory Visit (HOSPITAL_COMMUNITY): Payer: Self-pay | Admitting: *Deleted

## 2016-04-18 DIAGNOSIS — C50411 Malignant neoplasm of upper-outer quadrant of right female breast: Secondary | ICD-10-CM

## 2016-04-18 DIAGNOSIS — D500 Iron deficiency anemia secondary to blood loss (chronic): Secondary | ICD-10-CM

## 2016-04-19 ENCOUNTER — Encounter (HOSPITAL_COMMUNITY): Payer: Medicare Other | Attending: Oncology | Admitting: Oncology

## 2016-04-19 ENCOUNTER — Encounter (HOSPITAL_COMMUNITY): Payer: Self-pay

## 2016-04-19 ENCOUNTER — Encounter (HOSPITAL_COMMUNITY): Payer: Medicare Other

## 2016-04-19 VITALS — BP 156/91 | HR 74 | Temp 98.2°F | Resp 24 | Wt 229.2 lb

## 2016-04-19 DIAGNOSIS — K7460 Unspecified cirrhosis of liver: Secondary | ICD-10-CM | POA: Diagnosis not present

## 2016-04-19 DIAGNOSIS — D500 Iron deficiency anemia secondary to blood loss (chronic): Secondary | ICD-10-CM

## 2016-04-19 DIAGNOSIS — D229 Melanocytic nevi, unspecified: Secondary | ICD-10-CM

## 2016-04-19 DIAGNOSIS — R161 Splenomegaly, not elsewhere classified: Secondary | ICD-10-CM

## 2016-04-19 DIAGNOSIS — I8500 Esophageal varices without bleeding: Secondary | ICD-10-CM | POA: Insufficient documentation

## 2016-04-19 DIAGNOSIS — K766 Portal hypertension: Secondary | ICD-10-CM | POA: Diagnosis not present

## 2016-04-19 DIAGNOSIS — D509 Iron deficiency anemia, unspecified: Secondary | ICD-10-CM | POA: Diagnosis not present

## 2016-04-19 DIAGNOSIS — D696 Thrombocytopenia, unspecified: Secondary | ICD-10-CM | POA: Diagnosis not present

## 2016-04-19 DIAGNOSIS — I10 Essential (primary) hypertension: Secondary | ICD-10-CM | POA: Insufficient documentation

## 2016-04-19 DIAGNOSIS — Z853 Personal history of malignant neoplasm of breast: Secondary | ICD-10-CM | POA: Diagnosis not present

## 2016-04-19 DIAGNOSIS — E119 Type 2 diabetes mellitus without complications: Secondary | ICD-10-CM | POA: Insufficient documentation

## 2016-04-19 DIAGNOSIS — Z808 Family history of malignant neoplasm of other organs or systems: Secondary | ICD-10-CM | POA: Diagnosis not present

## 2016-04-19 DIAGNOSIS — Z9889 Other specified postprocedural states: Secondary | ICD-10-CM | POA: Insufficient documentation

## 2016-04-19 DIAGNOSIS — Z7984 Long term (current) use of oral hypoglycemic drugs: Secondary | ICD-10-CM | POA: Insufficient documentation

## 2016-04-19 DIAGNOSIS — Z79899 Other long term (current) drug therapy: Secondary | ICD-10-CM | POA: Diagnosis not present

## 2016-04-19 DIAGNOSIS — K7581 Nonalcoholic steatohepatitis (NASH): Secondary | ICD-10-CM | POA: Insufficient documentation

## 2016-04-19 DIAGNOSIS — D6959 Other secondary thrombocytopenia: Secondary | ICD-10-CM | POA: Diagnosis not present

## 2016-04-19 DIAGNOSIS — Z923 Personal history of irradiation: Secondary | ICD-10-CM | POA: Diagnosis not present

## 2016-04-19 DIAGNOSIS — Z90710 Acquired absence of both cervix and uterus: Secondary | ICD-10-CM | POA: Insufficient documentation

## 2016-04-19 DIAGNOSIS — E785 Hyperlipidemia, unspecified: Secondary | ICD-10-CM | POA: Diagnosis not present

## 2016-04-19 DIAGNOSIS — C50411 Malignant neoplasm of upper-outer quadrant of right female breast: Secondary | ICD-10-CM

## 2016-04-19 DIAGNOSIS — D72819 Decreased white blood cell count, unspecified: Secondary | ICD-10-CM | POA: Diagnosis not present

## 2016-04-19 DIAGNOSIS — Z8042 Family history of malignant neoplasm of prostate: Secondary | ICD-10-CM | POA: Insufficient documentation

## 2016-04-19 DIAGNOSIS — K3189 Other diseases of stomach and duodenum: Secondary | ICD-10-CM | POA: Diagnosis not present

## 2016-04-19 DIAGNOSIS — Z823 Family history of stroke: Secondary | ICD-10-CM | POA: Diagnosis not present

## 2016-04-19 LAB — CBC WITH DIFFERENTIAL/PLATELET
BASOS ABS: 0 10*3/uL (ref 0.0–0.1)
Basophils Relative: 1 %
EOS PCT: 3 %
Eosinophils Absolute: 0.1 10*3/uL (ref 0.0–0.7)
HCT: 39.7 % (ref 36.0–46.0)
Hemoglobin: 13.5 g/dL (ref 12.0–15.0)
LYMPHS PCT: 25 %
Lymphs Abs: 0.9 10*3/uL (ref 0.7–4.0)
MCH: 31.8 pg (ref 26.0–34.0)
MCHC: 34 g/dL (ref 30.0–36.0)
MCV: 93.6 fL (ref 78.0–100.0)
Monocytes Absolute: 0.4 10*3/uL (ref 0.1–1.0)
Monocytes Relative: 11 %
NEUTROS ABS: 2.2 10*3/uL (ref 1.7–7.7)
NEUTROS PCT: 61 %
PLATELETS: 68 10*3/uL — AB (ref 150–400)
RBC: 4.24 MIL/uL (ref 3.87–5.11)
RDW: 14 % (ref 11.5–15.5)
WBC: 3.6 10*3/uL — AB (ref 4.0–10.5)

## 2016-04-19 LAB — COMPREHENSIVE METABOLIC PANEL
ALT: 19 U/L (ref 14–54)
AST: 32 U/L (ref 15–41)
Albumin: 3.3 g/dL — ABNORMAL LOW (ref 3.5–5.0)
Alkaline Phosphatase: 73 U/L (ref 38–126)
Anion gap: 6 (ref 5–15)
BUN: 7 mg/dL (ref 6–20)
CHLORIDE: 105 mmol/L (ref 101–111)
CO2: 27 mmol/L (ref 22–32)
CREATININE: 0.94 mg/dL (ref 0.44–1.00)
Calcium: 8.8 mg/dL — ABNORMAL LOW (ref 8.9–10.3)
GFR calc Af Amer: >60 mL/min (ref >60)
GFR, EST NON AFRICAN AMERICAN: 57 mL/min — AB (ref >60)
Glucose, Bld: 242 mg/dL — ABNORMAL HIGH (ref 65–99)
Potassium: 4 mmol/L (ref 3.5–5.1)
Sodium: 138 mmol/L (ref 135–145)
Total Bilirubin: 1.3 mg/dL — ABNORMAL HIGH (ref 0.3–1.2)
Total Protein: 7.2 g/dL (ref 6.5–8.1)

## 2016-04-19 LAB — IRON AND TIBC
IRON: 103 ug/dL (ref 28–170)
Saturation Ratios: 30 % (ref 10.4–31.8)
TIBC: 342 ug/dL (ref 250–450)
UIBC: 239 ug/dL

## 2016-04-19 LAB — VITAMIN B12: VITAMIN B 12: 332 pg/mL (ref 180–914)

## 2016-04-19 LAB — FERRITIN: FERRITIN: 106 ng/mL (ref 11–307)

## 2016-04-19 NOTE — Patient Instructions (Addendum)
[place] at [facility] Discharge Instructions  RECOMMENDATIONS MADE BY THE CONSULTANT AND ANY TEST RESULTS WILL BE SENT TO YOUR REFERRING PHYSICIAN.  You were seen today by Dr. Twana First Follow up in 1 year with blood work See Amy up front for appointments   Thank you for choosing [place] at [facility] to provide your oncology and hematology care.  To afford each patient quality time with our provider, please arrive at least 15 minutes before your scheduled appointment time.    If you have a lab appointment with the [facility] please come in thru the  Main Entrance and check in at the main information desk  You need to re-schedule your appointment should you arrive 10 or more minutes late.  We strive to give you quality time with our providers, and arriving late affects you and other patients whose appointments are after yours.  Also, if you no show three or more times for appointments you may be dismissed from the clinic at the providers discretion.     Again, thank you for choosing [facility].  Our hope is that these requests will decrease the amount of time that you wait before being seen by our physicians.       _____________________________________________________________  Should you have questions after your visit to [facility], please contact our office at [phone] between the hours of 8:30 a.m. and 4:30 p.m.  Voicemails left after 4:30 p.m. will not be returned until the following business day.  For prescription refill requests, have your pharmacy contact our office.       Resources For Cancer Patients and their Caregivers ? American Cancer Society: Can assist with transportation, wigs, general needs, runs Look Good Feel Better.        819-247-7386 ? Cancer Care: Provides financial assistance, online support groups, medication/co-pay assistance.  1-800-813-HOPE 726-786-6411) ? [place] Assists [place] cancer patients and their families through emotional , educational and financial support.  713-874-6676 ? [place] DSS Where to apply for food stamps, Medicaid and utility assistance. 873-011-4007 ? [facility]: Transportation to medical appointments. 867-412-3527 ? Social Security Administration: May apply for disability if have a Stage IV cancer. (321)617-3781 862 844 3365 ? [employer], Disability and Transit Services: Assists with nutrition, care and transit needs. [place] Support Programs: @10RELATIVEDAYS @ > Cancer Support Group  2nd Tuesday of the month 1pm-2pm, Journey Room  > Creative Journey  3rd Tuesday of the month 1130am-1pm, Journey Room  > Look Good Feel Better  1st Wednesday of the month 10am-12 noon, Journey Room (Call [place] to register [phone])

## 2016-04-19 NOTE — Progress Notes (Signed)
[place]  PROGRESS NOTE  Patient Care Team: Cheryl Bis, Velasquez as PCP - General (Unknown Physician Specialty)  CHIEF COMPLAINTS/PURPOSE OF CONSULTATION:  Hx of Stage I carcinoma of the UOQ of the Right breast Breast cancer treated with surgery, XRT and 5 years arimidex Hx of iron deficiency anemia Hx of cirrhosis with thrombocytopenia and leukopenia, followed by Cheryl Velasquez    Breast cancer of upper-outer quadrant of right female breast ([place])   12/12/2003 Initial Biopsy    T1b tumor, ER+ greater than 90%, PR + 20%, HER 2/neu negative.      12/19/2003 Surgery    Lumpectomy, sentinel node. No metastatic carcinoma in 4 LN. No residual cancer in the biopsy specimen      01/06/2004 - 01/06/2009 Anti-estrogen oral therapy    Arimidex X 5 years      01/19/2004 - 03/22/2004 Radiation Therapy    5040 cGY in 28 fractions delivered with boost to tumor bed totaling 6040 cGY        HISTORY OF PRESENTING ILLNESS:  Cheryl Velasquez 76 y.o. female is here to establish ongoing care for a history of stage I breast cancer, leukopenia and thrombocytopenia from cirrhosis and splenomegaly.  Cheryl Velasquez is unaccompanied and uses a cane to ambulate. She presents for continued follow up. I personally reviewed and went over labs with the patient.  She states she has been doing well since her last visit.   She recently underwent CT guided thermal ablation of left lobe HCC by Cheryl Velasquez on 03/11/16.   She states she had a mammogram done at [facility] this year.   She states she doesn't have any other health issues.  Denies sob, chest pain, breathing issues, abdominal pain, and swelling, bleeding complications.    MEDICAL HISTORY:  Past Medical History:  Diagnosis Date  . Arthritis   . Cirrhosis ([place])   . Diabetes mellitus   . Dyspnea    shortness of breath with any exertion  . History of breast cancer   . History of chicken pox   . Hyperlipidemia   . Hypertension   .  Thyroid disease     SURGICAL HISTORY: Past Surgical History:  Procedure Laterality Date  . ABDOMINAL HYSTERECTOMY  1987  . BREAST BIOPSY  2005  . ESOPHAGEAL BANDING N/A 12/13/2012   Procedure: ESOPHAGEAL BANDING;  Surgeon: Cheryl Velasquez;  [facility];  Service: Endoscopy;  Laterality: N/A;  . ESOPHAGOGASTRODUODENOSCOPY N/A 09/20/2012   Procedure: ESOPHAGOGASTRODUODENOSCOPY (EGD);  Surgeon: Cheryl Velasquez;  [facility];  Service: Endoscopy;  Laterality: N/A;  125-moved to 12:00 Ann notified pt  . ESOPHAGOGASTRODUODENOSCOPY N/A 12/13/2012   Procedure: ESOPHAGOGASTRODUODENOSCOPY (EGD);  Surgeon: Cheryl Velasquez;  [facility];  Service: Endoscopy;  Laterality: N/A;  155-moved to 220 Ann to notify pt  . ESOPHAGOGASTRODUODENOSCOPY N/A 01/11/2016   Procedure: ESOPHAGOGASTRODUODENOSCOPY (EGD);  Surgeon: Cheryl Velasquez;  [facility];  Service: Endoscopy;  Laterality: N/A;  7:30  . EYE SURGERY     bilateral cataract surgery with lens implants  . HERNIA REPAIR  2004  . IR GENERIC HISTORICAL  01/19/2016   IR RADIOLOGIST EVAL & MGMT 01/19/2016 Cheryl Velasquez [facility] INTERV RAD  . IR GENERIC HISTORICAL  03/29/2016   IR RADIOLOGIST EVAL & MGMT 03/29/2016 Cheryl Velasquez [facility] INTER[facility] RAD  . thyroid treatment     radioactive iodine treatment    SOCIAL HISTORY: Social  History   Social History  . Marital status: Single    Spouse name: N/A  . Number of children: 0  . Years of education: 10   Occupational History  . Textiles    Social History Main Topics  . Smoking status: Never Smoker  . Smokeless tobacco: Never Used  . Alcohol use No  . Drug use: No  . Sexual activity: No   Other Topics Concern  . Not on file   Social History Narrative   Regular Exercise-no   Single She lives by herself. 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 Dementia Sister   . Dementia Sister   . Alzheimer's disease Sister   . Liver disease Brother   . Cancer Other     Colon Cancer, Prostate Cancer-Other Blood Relative  . Hyperlipidemia Other     Other Blood Relative  . Stroke Other     Parent, Other Blood Relative  . Diabetes Other     Parent, Other Blood Relative  . Hypertension Other     Parent  . Prostate cancer Maternal Aunt   . Dementia Maternal Aunt    Mother deceased in her 68s of congestive heart failure Father deceased in his 20s of a heart attack. He was a smoker and ate what he liked, "grease and coffee". 8 siblings total.  1 sister living who has Alzheimer's and Parkinson's disease No family history of breast cancer she is aware of  ALLERGIES:  has No Known Allergies.  MEDICATIONS:  Current Outpatient Prescriptions  Medication Sig Dispense Refill  . albuterol (PROAIR HFA) 108 (90 BASE) MCG/ACT inhaler Inhale 2 puffs into the lungs every 6 (six) hours as needed for wheezing or shortness of breath.    . ALPRAZolam (XANAX) 0.5 MG tablet Take 0.5 mg by mouth daily as needed for anxiety.     . Calcium Carbonate-Vitamin D (CALTRATE 600+D) 600-400 MG-UNIT per tablet Take 1 tablet by mouth daily.      . captopril (CAPOTEN) 25 MG tablet Take 25 mg by mouth 2 (two) times daily.      . furosemide (LASIX) 20 MG tablet Take 20 mg by mouth daily as needed for edema.     Marland Kitchen glipiZIDE (GLUCOTROL XL) 5 MG 24 hr tablet Take 5 mg by mouth daily.      . hydroxypropyl methylcellulose (ISOPTO TEARS) 2.5 % ophthalmic solution Place 1 drop into both eyes daily as needed (Dry Eyes).    . metFORMIN (GLUCOPHAGE-XR) 500 MG 24 hr tablet Take 1,000 mg by mouth 2 (two) times daily.     . Multiple Vitamins-Minerals (CENTRUM SILVER PO) Take 1 tablet by mouth daily.      . pantoprazole (PROTONIX) 40 MG tablet TAKE 1  TABLET BY MOUTH EVERY DAY (Patient taking differently: Take 30ms daily) 30 tablet 5  . propranolol (INDERAL) 20 MG tablet TAKE 1 TABLET BY MOUTH TWO TIMES DAILY (Patient taking differently: Take 20 mgs by mouth twice daily) 60 tablet 5  . simvastatin (ZOCOR) 40 MG tablet Take 40 mg by mouth daily.      . vitamin C (ASCORBIC ACID) 500 MG tablet Take 500 mg by mouth daily.     No current facility-administered medications for this visit.  Review of Systems  Constitutional: Negative.        States doing well  HENT: Negative.   Eyes: Negative.   Respiratory: Negative.  Negative for shortness of breath and wheezing.   Cardiovascular: Negative.  Negative for chest pain.       Denies swelling  Gastrointestinal: Negative.  Negative for abdominal pain.  Genitourinary: Negative.   Musculoskeletal: Negative.   Skin: Negative.   Neurological: Negative.   Endo/Heme/Allergies: Negative.   Psychiatric/Behavioral: Negative.   All other systems reviewed and are negative. 14 point ROS was done and is otherwise as detailed above or in HPI   PHYSICAL EXAMINATION: ECOG PERFORMANCE STATUS: 1 - Symptomatic but completely ambulatory  Vitals:   04/19/16 1020  BP: (!) 156/91  Pulse: 74  Resp: (!) 24  Temp: 98.2 F (36.8 C)   Filed Weights   04/19/16 1020  Weight: 229 lb 3.2 oz (104 kg)    Physical Exam  Constitutional: She is oriented to person, place, and time and well-developed, well-nourished, and in no distress. No distress.  Obese  HENT:  Head: Normocephalic and atraumatic.  Nose: Nose normal.  Mouth/Throat: Oropharynx is clear and moist. No oropharyngeal exudate.  Eyes: Conjunctivae and EOM are normal. Pupils are equal, round, and reactive to light. Right eye exhibits no discharge. Left eye exhibits no discharge. No scleral icterus.  Neck: Normal range of motion. Neck supple. No JVD present. No tracheal deviation present. No thyromegaly present.  Cardiovascular: Normal rate, regular  rhythm and normal heart sounds.  Exam reveals no gallop and no friction rub.   No murmur heard. Pulmonary/Chest: Effort normal and breath sounds normal. No respiratory distress. She has no wheezes. She has no rales. Left breast exhibits no inverted nipple, no mass, no nipple discharge, no skin change and no tenderness.  Right Breast: well healed lumpectomy site and axillary LN biopsy site, no masses palpated, no axillary lymphadenopathy  Abdominal: Soft. Bowel sounds are normal. She exhibits no distension and no mass. There is no tenderness. There is no rebound and no guarding.  Musculoskeletal: Normal range of motion. She exhibits no edema or tenderness.  Lymphadenopathy:    She has no cervical adenopathy.    She has no axillary adenopathy.       Right: No supraclavicular adenopathy present.       Left: No supraclavicular adenopathy present.  Neurological: She is alert and oriented to person, place, and time. She has normal reflexes. No cranial nerve deficit. Gait normal. Coordination normal.  Skin: Skin is warm and dry. No rash noted. No erythema. No pallor.  Multiple moles throughout her body. She has an asymmetryical mole about 1.3 cm in the epigastrium region.   Psychiatric: Mood, memory, affect and judgment normal.  Nursing note and vitals reviewed.   LABORATORY DATA:  I have reviewed the data as listed Lab Results  Component Value Date   WBC 3.6 (L) 04/19/2016   HGB 13.5 04/19/2016   HCT 39.7 04/19/2016   MCV 93.6 04/19/2016   PLT 68 (L) 04/19/2016   CMP     Component Value Date/Time   NA 138 04/19/2016 0929   K 4.0 04/19/2016 0929   CL 105 04/19/2016 0929   CO2 27 04/19/2016 0929   GLUCOSE 242 (H) 04/19/2016 0929   BUN 7 04/19/2016 0929   CREATININE 0.94 04/19/2016 0929   CALCIUM 8.8 (L) 04/19/2016 0929   PROT 7.2 04/19/2016 0929   ALBUMIN 3.3 (L) 04/19/2016 0929   AST 32 04/19/2016  0929   ALT 19 04/19/2016 0929   ALKPHOS 73 04/19/2016 0929   BILITOT 1.3 (H)  04/19/2016 0929   GFRNONAA 57 (L) 04/19/2016 0929   GFRAA >60 04/19/2016 0929       RADIOGRAPHIC STUDIES: I have personally reviewed the radiological images as listed and agreed with the findings in the report. CLINICAL DATA:  Cirrhosis. Elevated AFP. Follow-up of hepatic lesion.  EXAM: MRI ABDOMEN WITHOUT AND WITH CONTRAST  TECHNIQUE: Multiplanar multisequence MR imaging of the abdomen was performed both before and after the administration of intravenous contrast.  CONTRAST:  9.0 ml Eovist, a mixed extracellular and hepatocyte specific contrast agent.  COMPARISON:  01/05/2015.  FINDINGS: Portions of exam are mildly motion degraded.  Lower chest: Mild cardiomegaly. Small left pleural effusion is similar.  Hepatobiliary: Cirrhosis. Segment 4A liver lesion is again identified. This demonstrates arterial hyper enhancement, measuring 2.2 x 1.6 cm on image 14/ series 1001. When remeasured at the same level on the prior exam, 1.7 x 1.3 cm. Subtle late central hypoenhancement suggested on image 14/ series 103. Hypoenhancing on hepatobiliary phase imaging. Restricted diffusion, including on image 87/series 15.  No new liver lesions. Tiny gallstone or stones, more apparent on the prior exam.  Pancreas: Mild pancreatic atrophy, without duct dilatation.  Spleen: Splenomegaly, 14.9 cm craniocaudal.  Adrenals/Urinary Tract: Normal adrenal glands. Right renal lesions involving the lower pole. These are too small to characterize. More medial lesion demonstrates a 7 mm focus of T1 hyperintensity on image 44/ series 1000 and is likely a complex cyst. Normal left kidney, without hydronephrosis.  Stomach/Bowel: Normal stomach and abdominal bowel loops.  Vascular/Lymphatic: Separate origins of the splenic and common hepatic arteries. No splenic artery aneurysm. Normal aortic caliber. Patent hepatic and portal veins. Portal venous hypertension, including a  recannulized paraumbilical vein.  Other: Similar small volume perihepatic ascites.  Musculoskeletal: No acute osseous abnormality.  IMPRESSION: 1. Segment 4A liver lesion demonstrates mild interval enlargement and is suspicious for a hepatocellular carcinoma. Given equivocal delayed hypo enhancement, this lesion is not diagnostic of hepatocellular carcinoma by consensus criteria. Biopsy should be considered. 2. Cirrhosis and portal venous hypertension. 3. Small volume ascites with similar left pleural effusion. References: Bruix Maury Dus; American Association for the Study of Liver Diseases. Management of hepatocellular carcinoma: an update. Hepatology 2011;53:1020-1022, and [place] for Liver Transplant Allocation: Standardization of Liver Imaging, Diagnosis, Classification, and Reporting of HepatocellularsecondaryCarcinoma1 Radiology: Volume 266: Number 2-February 2013   Electronically Signed   By: Abigail Miyamoto M.D.   On: 07/01/2015 09:25  CT-GUIDED PERCUTANEOUS THERMAL ABLATION OF HEPATOCELLULAR CARCINOMA OF THE LIVER 03/11/2016  IMPRESSION: CT guided percutaneous thermal ablation of hepatocellular carcinoma. The patient will be observed overnight. Initial follow-up will be performed in approximately 4 weeks.    ASSESSMENT & PLAN:  Hx of Stage I carcinoma of the UOQ of the Right breast Hx of iron deficiency anemia Hx of B12 deficiency Cirrhosis secondary to NASH with thrombocytopenia and leukopenia Liver lesion followed by Cheryl Velasquez Elevated AFP Esophageal varices Portal gastropathy  In regards to her breast cancer she continues to obtain yearly mammograms. She does these at [facility], I will get her most recent mammogram from this year which she states she has performed. Breast exam today was performed and without obvious recurrence. She will need ongoing yearly follow-up.  Thrombocytopenia due to underlying liver cirrhosis is stable.   She  will continue to follow with Cheryl Velasquez for her liver disease and liver mass.  She has an asymmetryical mole about 1.3 cm in the epigastrium region. I have advised her to go see her dermatologist asap to get it excised. She states she has an appt with him in the next few weeks.     RTC in 1 year for routine exam and follow up. I told her that if she feels any new breast masses prior to her next visit, she is to let us know. Also if her thrombocytopenia should worsen, she needs to come see [place] sooner, she verbalized understanding.  CBC, CMP on her next visit.   All questions were answered. The patient knows to call the clinic with any problems, questions or concerns.  This document serves as a record of services personally performed by Twana First, Velasquez. It was created on her behalf by Shirlean Mylar, a trained medical scribe. The creation of this record is based on the scribe's personal observations and the provider's statements to them. This document has been checked and approved by the attending provider.  I have reviewed the above documentation for accuracy and completeness, and I agree with the above.  This note was electronically signed.    [employer]  04/19/2016 10:31 AM

## 2016-05-02 DIAGNOSIS — K219 Gastro-esophageal reflux disease without esophagitis: Secondary | ICD-10-CM | POA: Diagnosis not present

## 2016-05-02 DIAGNOSIS — E1165 Type 2 diabetes mellitus with hyperglycemia: Secondary | ICD-10-CM | POA: Diagnosis not present

## 2016-05-02 DIAGNOSIS — E039 Hypothyroidism, unspecified: Secondary | ICD-10-CM | POA: Diagnosis not present

## 2016-05-02 DIAGNOSIS — D649 Anemia, unspecified: Secondary | ICD-10-CM | POA: Diagnosis not present

## 2016-05-02 DIAGNOSIS — I10 Essential (primary) hypertension: Secondary | ICD-10-CM | POA: Diagnosis not present

## 2016-05-02 DIAGNOSIS — E782 Mixed hyperlipidemia: Secondary | ICD-10-CM | POA: Diagnosis not present

## 2016-05-05 DIAGNOSIS — I5030 Unspecified diastolic (congestive) heart failure: Secondary | ICD-10-CM | POA: Diagnosis not present

## 2016-05-05 DIAGNOSIS — E782 Mixed hyperlipidemia: Secondary | ICD-10-CM | POA: Diagnosis not present

## 2016-05-05 DIAGNOSIS — K219 Gastro-esophageal reflux disease without esophagitis: Secondary | ICD-10-CM | POA: Diagnosis not present

## 2016-05-05 DIAGNOSIS — E039 Hypothyroidism, unspecified: Secondary | ICD-10-CM | POA: Diagnosis not present

## 2016-05-05 DIAGNOSIS — F419 Anxiety disorder, unspecified: Secondary | ICD-10-CM | POA: Diagnosis not present

## 2016-05-05 DIAGNOSIS — E1165 Type 2 diabetes mellitus with hyperglycemia: Secondary | ICD-10-CM | POA: Diagnosis not present

## 2016-05-05 DIAGNOSIS — I10 Essential (primary) hypertension: Secondary | ICD-10-CM | POA: Diagnosis not present

## 2016-05-05 DIAGNOSIS — H6122 Impacted cerumen, left ear: Secondary | ICD-10-CM | POA: Diagnosis not present

## 2016-05-09 DIAGNOSIS — L570 Actinic keratosis: Secondary | ICD-10-CM | POA: Diagnosis not present

## 2016-05-10 ENCOUNTER — Encounter (HOSPITAL_COMMUNITY): Payer: Self-pay | Admitting: Interventional Radiology

## 2016-05-18 ENCOUNTER — Other Ambulatory Visit (HOSPITAL_COMMUNITY): Payer: Self-pay | Admitting: Interventional Radiology

## 2016-05-18 DIAGNOSIS — C220 Liver cell carcinoma: Secondary | ICD-10-CM

## 2016-05-24 ENCOUNTER — Other Ambulatory Visit: Payer: Self-pay | Admitting: *Deleted

## 2016-05-24 DIAGNOSIS — C220 Liver cell carcinoma: Secondary | ICD-10-CM

## 2016-06-14 ENCOUNTER — Other Ambulatory Visit: Payer: Self-pay | Admitting: Radiology

## 2016-06-14 ENCOUNTER — Ambulatory Visit (INDEPENDENT_AMBULATORY_CARE_PROVIDER_SITE_OTHER): Payer: Medicare Other | Admitting: Endocrinology

## 2016-06-14 ENCOUNTER — Encounter: Payer: Self-pay | Admitting: Endocrinology

## 2016-06-14 ENCOUNTER — Ambulatory Visit
Admission: RE | Admit: 2016-06-14 | Discharge: 2016-06-14 | Disposition: A | Discharge status: Home / Self Care | Payer: Medicare Other | Source: Ambulatory Visit | Attending: Interventional Radiology | Admitting: Interventional Radiology

## 2016-06-14 ENCOUNTER — Ambulatory Visit (HOSPITAL_COMMUNITY)
Admission: RE | Admit: 2016-06-14 | Discharge: 2016-06-14 | Disposition: A | Discharge status: Home / Self Care | Payer: Medicare Other | Source: Ambulatory Visit | Attending: Interventional Radiology | Admitting: Interventional Radiology

## 2016-06-14 ENCOUNTER — Encounter (HOSPITAL_COMMUNITY): Payer: Self-pay

## 2016-06-14 VITALS — BP 148/80 | HR 92 | Ht 62.0 in | Wt 232.0 lb

## 2016-06-14 DIAGNOSIS — C220 Liver cell carcinoma: Secondary | ICD-10-CM

## 2016-06-14 DIAGNOSIS — Z9889 Other specified postprocedural states: Secondary | ICD-10-CM | POA: Insufficient documentation

## 2016-06-14 DIAGNOSIS — K8020 Calculus of gallbladder without cholecystitis without obstruction: Secondary | ICD-10-CM | POA: Diagnosis not present

## 2016-06-14 DIAGNOSIS — E042 Nontoxic multinodular goiter: Secondary | ICD-10-CM | POA: Diagnosis not present

## 2016-06-14 DIAGNOSIS — R932 Abnormal findings on diagnostic imaging of liver and biliary tract: Secondary | ICD-10-CM | POA: Diagnosis not present

## 2016-06-14 HISTORY — PX: IR RADIOLOGIST EVAL & MGMT: IMG5224

## 2016-06-14 LAB — POCT I-STAT CREATININE: Creatinine, Ser: 0.7 mg/dL (ref 0.44–1.00)

## 2016-06-14 LAB — TSH: TSH: 2.18 u[IU]/mL (ref 0.35–4.50)

## 2016-06-14 MED ORDER — IOPAMIDOL (ISOVUE-300) INJECTION 61%
100.0000 mL | Freq: Once | INTRAVENOUS | Status: AC | PRN
  Administered 2016-06-14: 100 mL via INTRAVENOUS

## 2016-06-14 NOTE — Progress Notes (Signed)
Subjective:    Patient ID: Cheryl Velasquez, female    DOB: 1940/06/16, 76 y.o.   MRN: [id number]  HPI Pt returns for f/u of hyperthyroidism (due to multinodular goiter; dx'ed March of 2013; she presented with a thyroid nodule, cold on scan; bx was benign; she then had RAI; TSH normalized, and has stayed normal since then).  pt states she feels well in general, except for slight tremor.  She does not notice the goiter.   Past Medical History:  Diagnosis Date  . Arthritis   . Cirrhosis ([place])   . Diabetes mellitus   . Dyspnea    shortness of breath with any exertion  . History of breast cancer   . History of chicken pox   . Hyperlipidemia   . Hypertension   . Thyroid disease     Past Surgical History:  Procedure Laterality Date  . ABDOMINAL HYSTERECTOMY  1987  . BREAST BIOPSY  2005  . ESOPHAGEAL BANDING N/A 12/13/2012   Procedure: ESOPHAGEAL BANDING;  Surgeon: Rogene Houston, MD;  [facility];  Service: Endoscopy;  Laterality: N/A;  . ESOPHAGOGASTRODUODENOSCOPY N/A 09/20/2012   Procedure: ESOPHAGOGASTRODUODENOSCOPY (EGD);  Surgeon: Rogene Houston, MD;  [facility];  Service: Endoscopy;  Laterality: N/A;  125-moved to 12:00 Ann notified pt  . ESOPHAGOGASTRODUODENOSCOPY N/A 12/13/2012   Procedure: ESOPHAGOGASTRODUODENOSCOPY (EGD);  Surgeon: Rogene Houston, MD;  [facility];  Service: Endoscopy;  Laterality: N/A;  155-moved to 220 Ann to notify pt  . ESOPHAGOGASTRODUODENOSCOPY N/A 01/11/2016   Procedure: ESOPHAGOGASTRODUODENOSCOPY (EGD);  Surgeon: Rogene Houston, MD;  [facility];  Service: Endoscopy;  Laterality: N/A;  7:30  . EYE SURGERY     bilateral cataract surgery with lens implants  . HERNIA REPAIR  2004  . IR GENERIC HISTORICAL  01/19/2016   IR RADIOLOGIST EVAL & MGMT 01/19/2016 Aletta Edouard, MD [facility] INTERV RAD  . IR GENERIC HISTORICAL  03/29/2016   IR RADIOLOGIST EVAL & MGMT 03/29/2016 Aletta Edouard, MD [facility] INTERV RAD  .  RADIOFREQUENCY ABLATION N/A 03/11/2016   Procedure: LIVER THERMAL ABLATION;  Surgeon: Aletta Edouard, MD;  Location: [facility];  Service: Anesthesiology;  Laterality: N/A;  . thyroid treatment     radioactive iodine treatment    Social History   Social History  . Marital status: Single    Spouse name: N/A  . Number of children: 0  . Years of education: 10   Occupational History  . Textiles    Social History Main Topics  . Smoking status: Never Smoker  . Smokeless tobacco: Never Used  . Alcohol use No  . Drug use: No  . Sexual activity: No   Other Topics Concern  . Not on file   Social History Narrative   Regular Exercise-no    Current Outpatient Prescriptions on File Prior to Visit  Medication Sig Dispense Refill  . albuterol (PROAIR HFA) 108 (90 BASE) MCG/ACT inhaler Inhale 2 puffs into the lungs every 6 (six) hours as needed for wheezing or shortness of breath.    . ALPRAZolam (XANAX) 0.5 MG tablet Take 0.5 mg by mouth daily as needed for anxiety.     . Calcium Carbonate-Vitamin D (CALTRATE 600+D) 600-400 MG-UNIT per tablet Take 1 tablet by mouth daily.      . captopril (CAPOTEN) 25 MG tablet Take 25 mg by mouth 2 (two) times daily.      . furosemide (LASIX) 20 MG tablet Take 20 mg  by mouth daily as needed for edema.     Marland Kitchen glipiZIDE (GLUCOTROL XL) 5 MG 24 hr tablet Take 5 mg by mouth daily.      . hydroxypropyl methylcellulose (ISOPTO TEARS) 2.5 % ophthalmic solution Place 1 drop into both eyes daily as needed (Dry Eyes).    . metFORMIN (GLUCOPHAGE-XR) 500 MG 24 hr tablet Take 1,000 mg by mouth 2 (two) times daily.     . Multiple Vitamins-Minerals (CENTRUM SILVER PO) Take 1 tablet by mouth daily.      . pantoprazole (PROTONIX) 40 MG tablet TAKE 1 TABLET BY MOUTH EVERY DAY (Patient taking differently: Take 52ms daily) 30 tablet 5  . propranolol (INDERAL) 20 MG tablet TAKE 1 TABLET BY MOUTH TWO TIMES DAILY (Patient taking differently: Take 20 mgs by mouth twice daily) 60  tablet 5  . simvastatin (ZOCOR) 40 MG tablet Take 40 mg by mouth daily.      . vitamin C (ASCORBIC ACID) 500 MG tablet Take 500 mg by mouth daily.     No current facility-administered medications on file prior to visit.     No Known Allergies  Family History  Problem Relation Age of Onset  . Dementia Sister   . Dementia Sister   . Alzheimer's disease Sister   . Liver disease Brother   . Cancer Other     Colon Cancer, Prostate Cancer-Other Blood Relative  . Hyperlipidemia Other     Other Blood Relative  . Stroke Other     Parent, Other Blood Relative  . Diabetes Other     Parent, Other Blood Relative  . Hypertension Other     Parent  . Prostate cancer Maternal Aunt   . Dementia Maternal Aunt     BP (!) 148/80   Pulse 92   Ht 5' 2"  (1.575 m)   Wt 232 lb (105.2 kg)   SpO2 97%   BMI 42.43 kg/m    Review of Systems Denies palpitations.     Objective:   Physical Exam VITAL SIGNS:  See vs page GENERAL: no distress NECK: There is no palpable thyroid enlargement.  No thyroid nodule is palpable.  No palpable lymphadenopathy at the anterior neck.  Lab Results  Component Value Date   TSH 2.18 06/14/2016      Assessment & Plan:  Hyperthyroidism: has not yet recurred since RAI.   Multinodular goiter: due for recheck. HTN: on rx, with borderline control.   Patient Instructions  A thyroid blood test is requested for you today.  We'll let you know about the results. Let's recheck the ultrasound.  you will receive a phone call, about a day and time for an appointment  Please come back for a follow-up appointment in 1 year.  most of the time, a "lumpy thyroid" will eventually become overactive again.  this is usually a slow process, happening over the span of many years.

## 2016-06-14 NOTE — Patient Instructions (Addendum)
A thyroid blood test is requested for you today.  We'll let you know about the results. Let's recheck the ultrasound.  you will receive a phone call, about a day and time for an appointment  Please come back for a follow-up appointment in 1 year.  most of the time, a "lumpy thyroid" will eventually become overactive again.  this is usually a slow process, happening over the span of many years.

## 2016-06-15 ENCOUNTER — Telehealth: Payer: Self-pay | Admitting: Endocrinology

## 2016-06-15 NOTE — Telephone Encounter (Signed)
Pt called in and was wondering if we had gotten her blood work back yet.  She said she is still a little shaky today but not as bad.  She said you may leave a message if she is not home.

## 2016-06-15 NOTE — Telephone Encounter (Signed)
The lab work has not been resulted by the doctor yet

## 2016-06-16 ENCOUNTER — Telehealth: Payer: Self-pay | Admitting: *Deleted

## 2016-06-16 NOTE — Telephone Encounter (Signed)
Patient called requesting her ultra sound needs to be scheduled in [facility]

## 2016-06-17 NOTE — Telephone Encounter (Signed)
Call on the status of the Ultrasound. Please advise

## 2016-06-20 DIAGNOSIS — C220 Liver cell carcinoma: Secondary | ICD-10-CM | POA: Diagnosis not present

## 2016-06-20 NOTE — Telephone Encounter (Signed)
[facility] is fine with me.

## 2016-06-20 NOTE — Telephone Encounter (Signed)
Pt called in and was trying to check the status of the Ultrasound, she said that she was trying to have it done at [facility] but someone called her and told her that she would be having it done in [place] and she said that she would not come to [place].

## 2016-06-20 NOTE — Telephone Encounter (Signed)
Patient is aware and she will be having [place] at [facility]

## 2016-06-20 NOTE — Telephone Encounter (Signed)
Sira Adsit Self [phone]  Cheryl Velasquez would like for someone to call her back and let her know the status of getting her [place], she would like for it to be scheduled next Thursday or Friday. Please call her with appointment. She stated this is her 3rd call to get this appointment.

## 2016-06-20 NOTE — Telephone Encounter (Signed)
Pt states the fax # for [place] is [phone]

## 2016-06-21 LAB — AFP TUMOR MARKER: AFP-Tumor Marker: 24.2 ng/mL — ABNORMAL HIGH (ref <6.1)

## 2016-06-28 ENCOUNTER — Telehealth: Payer: Self-pay | Admitting: Endocrinology

## 2016-06-28 NOTE — Telephone Encounter (Signed)
[facility] called and said they never received the order for the [place] and they need it refaxed to the [phone] ASAP

## 2016-06-29 NOTE — Telephone Encounter (Signed)
This has been faxed.

## 2016-06-30 ENCOUNTER — Encounter: Payer: Self-pay | Admitting: Endocrinology

## 2016-06-30 DIAGNOSIS — E042 Nontoxic multinodular goiter: Secondary | ICD-10-CM | POA: Diagnosis not present

## 2016-07-07 NOTE — Progress Notes (Signed)
Chief Complaint: Status post thermal ablation of hepatocellular carcinoma on 03/11/2016,  History of Present Illness: Cheryl Velasquez is a 76 y.o. female status post microwave ablation of a 3 cm segment 4a hepatocellular carcinoma on 03/11/16.  She has done well after ablation and presents today for a follow up CT scan.  She denies any abdominal complaints.  Past Medical History:  Diagnosis Date  . Arthritis   . Cirrhosis ([place])   . Diabetes mellitus   . Dyspnea    shortness of breath with any exertion  . History of breast cancer   . History of chicken pox   . Hyperlipidemia   . Hypertension   . Thyroid disease     Past Surgical History:  Procedure Laterality Date  . ABDOMINAL HYSTERECTOMY  1987  . BREAST BIOPSY  2005  . ESOPHAGEAL BANDING N/A 12/13/2012   Procedure: ESOPHAGEAL BANDING;  Surgeon: Rogene Houston, MD;  [facility];  Service: Endoscopy;  Laterality: N/A;  . ESOPHAGOGASTRODUODENOSCOPY N/A 09/20/2012   Procedure: ESOPHAGOGASTRODUODENOSCOPY (EGD);  Surgeon: Rogene Houston, MD;  [facility];  Service: Endoscopy;  Laterality: N/A;  125-moved to 12:00 Ann notified pt  . ESOPHAGOGASTRODUODENOSCOPY N/A 12/13/2012   Procedure: ESOPHAGOGASTRODUODENOSCOPY (EGD);  Surgeon: Rogene Houston, MD;  [facility];  Service: Endoscopy;  Laterality: N/A;  155-moved to 220 Ann to notify pt  . ESOPHAGOGASTRODUODENOSCOPY N/A 01/11/2016   Procedure: ESOPHAGOGASTRODUODENOSCOPY (EGD);  Surgeon: Rogene Houston, MD;  [facility];  Service: Endoscopy;  Laterality: N/A;  7:30  . EYE SURGERY     bilateral cataract surgery with lens implants  . HERNIA REPAIR  2004  . IR GENERIC HISTORICAL  01/19/2016   IR RADIOLOGIST EVAL & MGMT 01/19/2016 Aletta Edouard, MD [facility] INTER[facility] RAD  . IR GENERIC HISTORICAL  03/29/2016   IR RADIOLOGIST EVAL & MGMT 03/29/2016 Aletta Edouard, MD [facility]  . RADIOFREQUENCY ABLATION N/A 03/11/2016   Procedure:  LIVER THERMAL ABLATION;  Surgeon: Aletta Edouard, MD;  Location: [facility];  Service: Anesthesiology;  Laterality: N/A;  . thyroid treatment     radioactive iodine treatment    Allergies: Patient has no known allergies.  Medications: Prior to Admission medications   Medication Sig Start Date End Date Taking? Authorizing Provider  ALPRAZolam Duanne Moron) 0.5 MG tablet Take 0.5 mg by mouth daily as needed for anxiety.    Yes [provider]  Calcium Carbonate-Vitamin D (CALTRATE 600+D) 600-400 MG-UNIT per tablet Take 1 tablet by mouth daily.     Yes [provider]  captopril (CAPOTEN) 25 MG tablet Take 25 mg by mouth 2 (two) times daily.     Yes [provider]  furosemide (LASIX) 20 MG tablet Take 20 mg by mouth daily as needed for edema.  10/07/13  Yes [provider]  glipiZIDE (GLUCOTROL XL) 5 MG 24 hr tablet Take 5 mg by mouth daily.     Yes [provider]  hydroxypropyl methylcellulose (ISOPTO TEARS) 2.5 % ophthalmic solution Place 1 drop into both eyes daily as needed (Dry Eyes).   Yes [provider]  Multiple Vitamins-Minerals (CENTRUM SILVER PO) Take 1 tablet by mouth daily.     Yes [provider]  pantoprazole (PROTONIX) 40 MG tablet TAKE 1 TABLET BY MOUTH EVERY DAY Patient taking differently: Take 22ms daily 11/24/15  Yes Setzer, Terri L, NP  propranolol (INDERAL) 20 MG tablet TAKE 1 TABLET BY MOUTH TWO TIMES DAILY Patient  taking differently: Take 20 mgs by mouth twice daily 02/22/16  Yes Rehman, Mechele Dawley, MD  simvastatin (ZOCOR) 40 MG tablet Take 40 mg by mouth daily.     Yes [provider]  vitamin C (ASCORBIC ACID) 500 MG tablet Take 500 mg by mouth daily.   Yes [provider]  albuterol (PROAIR HFA) 108 (90 BASE) MCG/ACT inhaler Inhale 2 puffs into the lungs every 6 (six) hours as needed for wheezing or shortness of breath.    [provider]  metFORMIN (GLUCOPHAGE-XR) 500 MG 24 hr tablet Take  1,000 mg by mouth 2 (two) times daily.     [provider]     Family History  Problem Relation Age of Onset  . Dementia Sister   . Dementia Sister   . Alzheimer's disease Sister   . Liver disease Brother   . Cancer Other        Colon Cancer, Prostate Cancer-Other Blood Relative  . Hyperlipidemia Other        Other Blood Relative  . Stroke Other        Parent, Other Blood Relative  . Diabetes Other        Parent, Other Blood Relative  . Hypertension Other        Parent  . Prostate cancer Maternal Aunt   . Dementia Maternal Aunt     Social History   Social History  . Marital status: Single    Spouse name: N/A  . Number of children: 0  . Years of education: 10   Occupational History  . Textiles    Social History Main Topics  . Smoking status: Never Smoker  . Smokeless tobacco: Never Used  . Alcohol use No  . Drug use: No  . Sexual activity: No   Other Topics Concern  . Not on file   Social History Narrative   Regular Exercise-no    ECOG Status: 0 - Asymptomatic  Review of Systems: A 12 point ROS discussed and pertinent positives are indicated in the HPI above.  All other systems are negative.  Review of Systems  Constitutional: Negative.   Respiratory: Negative.   Cardiovascular: Negative.   Gastrointestinal: Negative.   Genitourinary: Negative.   Musculoskeletal: Negative.   Neurological: Negative.     Vital Signs: BP (!) 177/61 (BP Location: Left Arm, Patient Position: Sitting, Cuff Size: Large)   Pulse 77   Temp 97.8 F (36.6 C) (Oral)   Resp 14   Ht 5' 2"  (1.575 m)   Wt 230 lb (104.3 kg)   SpO2 98%   BMI 42.07 kg/m   Physical Exam  Constitutional: She is oriented to person, place, and time. No distress.  Abdominal: Soft. She exhibits no distension and no mass. There is no tenderness. There is no rebound and no guarding.  Neurological: She is alert and oriented to person, place, and time.  Skin: She is not diaphoretic.  Vitals  reviewed.    Imaging: Ct Abdomen W Wo Contrast  Result Date: 06/14/2016 CLINICAL DATA:  151m isovue300 Hepatocellular carcinoma. 3 months followup thermal ablation. Cirrhosis EXAM: CT ABDOMEN WITHOUT AND WITH CONTRAST TECHNIQUE: Multidetector CT imaging of the abdomen was performed following the standard protocol before and following the bolus administration of intravenous contrast. CONTRAST:  101mISOVUE-300 IOPAMIDOL (ISOVUE-300) INJECTION 61% COMPARISON:  CT 03/11/2016 FINDINGS: Lower chest:  Lung bases are clear. Hepatobiliary: The liver has a nodular contour with enlargement of the caudate lobe. Ablation site in the anterior subcapsular  LEFT hepatic lobe (segment 4A) measures 30 mm by 21 mm (image 12, series 2) compared with 30 mm x 21 mm on pretreatment MRI (01/18/2016). Postcontrast arterial imaging demonstrates mild post-contrast enhancement (HU 31 increasing to HU 42 on arterial phase imaging). The venous phase imaging is similar with HU equal 41. Lesion had avid arterial enhancement on comparison MRI with washout. No new enhancing hepatic lesions identified. Several gallstones within normal gallbladder otherwise. No biliary duct dilatation. Pancreas: Normal pancreatic parenchymal intensity. No ductal dilatation or inflammation. Spleen: Spleen is mildly enlarged. Adrenals/urinary tract: Adrenal glands and kidneys are normal. Stomach/Bowel: Stomach and limited of the small bowel is unremarkable Vascular/Lymphatic: Abdominal aortic normal caliber. No retroperitoneal periportal lymphadenopathy. Musculoskeletal: No aggressive osseous lesion IMPRESSION: 1. Stable subcapsular ablation site in the LEFT hepatic lobe with minimal post-contrast enhancement. 2. No new enhancing hepatic lesions present. 3. Morphologic changes consists with cirrhosis.  No ascites. 4. Cholelithiasis Electronically Signed   By: Suzy Bouchard M.D.   On: 06/14/2016 11:38    Labs:  CBC:  Recent Labs  03/10/16 1100  03/11/16 0916 03/12/16 0750 04/19/16 0929  WBC 2.3* 3.6* 4.4 3.6*  HGB 12.1 12.6 12.7 13.5  HCT 35.6* 36.6 37.0 39.7  PLT 54* 63* 78* 68*    COAGS:  Recent Labs  03/10/16 1100  INR 1.24    BMP:  Recent Labs  09/09/15 1600 01/18/16 1635 03/10/16 1100 03/12/16 0750 04/19/16 0929 06/14/16 1041  NA 136  --  137 137 138  --   K 4.3  --  4.2 3.7 4.0  --   CL 103  --  106 104 105  --   CO2 25  --  25 25 27   --   GLUCOSE 341*  --  220* 202* 242*  --   BUN 8  --  9 12 7   --   CALCIUM 8.5*  --  9.1 8.4* 8.8*  --   CREATININE 0.75 0.79 0.58 0.75 0.94 0.70  GFRNONAA >60 >60 >60 >60 57*  --   GFRAA >60 >60 >60 >60 >60  --     LIVER FUNCTION TESTS:  Recent Labs  12/18/15 1129 03/10/16 1100 03/12/16 0750 04/19/16 0929  BILITOT 1.3* 0.9 1.1 1.3*  AST 32 37 115* 32  ALT 22 24 50 19  ALKPHOS 59 57 55 73  PROT 6.6 6.5 6.6 7.2  ALBUMIN 3.5* 3.3* 3.4* 3.3*    TUMOR MARKERS:  Recent Labs  09/14/15 0831 12/18/15 1130 04/05/16 1317 06/20/16 0932  AFPTM 22.0* 28.5* 25.4* 24.2*    Assessment and Plan:  I reviewed the follow up CT with Mrs. Defilippo and her niece.  This demonstrates a well circumscribed ablation defect that appears to encompass the original enhancing tumor.  No abnormal enhancement is identified.  No new enhancing lesions are seen in the liver.  AFP level is down slightly.  I recommended a follow up MRI scan in 6 months with another AFP level when she follows up with Dr. Laural Golden in the fall.  Electronically SignedAletta Edouard T 07/07/2016, 10:02 AM   I spent a total of 15 Minutes in face to face in clinical consultation, greater than 50% of which was counseling/coordinating care post thermal ablation of a hepatocellular carcinoma.

## 2016-07-08 ENCOUNTER — Telehealth: Payer: Self-pay | Admitting: Endocrinology

## 2016-07-08 NOTE — Telephone Encounter (Signed)
Gave patient results and she stated an understanding

## 2016-07-08 NOTE — Telephone Encounter (Signed)
please call patient: Thyroid US: no change--good

## 2016-07-18 NOTE — Addendum Note (Signed)
Addendum  created 07/18/16 1028 by Myrtie Soman, MD   Sign clinical note

## 2016-07-29 ENCOUNTER — Encounter: Payer: Self-pay | Admitting: Interventional Radiology

## 2016-08-29 ENCOUNTER — Other Ambulatory Visit (INDEPENDENT_AMBULATORY_CARE_PROVIDER_SITE_OTHER): Payer: Self-pay | Admitting: Internal Medicine

## 2016-09-06 ENCOUNTER — Other Ambulatory Visit (INDEPENDENT_AMBULATORY_CARE_PROVIDER_SITE_OTHER): Payer: Self-pay | Admitting: Internal Medicine

## 2016-09-13 DIAGNOSIS — Z6841 Body Mass Index (BMI) 40.0 and over, adult: Secondary | ICD-10-CM | POA: Diagnosis not present

## 2016-09-13 DIAGNOSIS — B372 Candidiasis of skin and nail: Secondary | ICD-10-CM | POA: Diagnosis not present

## 2016-10-04 ENCOUNTER — Encounter (INDEPENDENT_AMBULATORY_CARE_PROVIDER_SITE_OTHER): Payer: Self-pay | Admitting: Internal Medicine

## 2016-10-04 ENCOUNTER — Encounter (INDEPENDENT_AMBULATORY_CARE_PROVIDER_SITE_OTHER): Payer: Self-pay

## 2016-10-04 ENCOUNTER — Ambulatory Visit (INDEPENDENT_AMBULATORY_CARE_PROVIDER_SITE_OTHER): Payer: Medicare Other | Admitting: Internal Medicine

## 2016-10-04 VITALS — BP 140/84 | HR 67 | Temp 97.6°F | Resp 18 | Ht 63.0 in | Wt 229.1 lb

## 2016-10-04 DIAGNOSIS — C220 Liver cell carcinoma: Secondary | ICD-10-CM | POA: Diagnosis not present

## 2016-10-04 DIAGNOSIS — K7460 Unspecified cirrhosis of liver: Secondary | ICD-10-CM | POA: Diagnosis not present

## 2016-10-04 NOTE — Patient Instructions (Signed)
Physician will call with results of blood tests when completed.

## 2016-10-04 NOTE — Progress Notes (Signed)
Presenting complaint;  Follow-up for cirrhosis and HCC.  Database and Subjective:  Cheryl Velasquez 76 year old Caucasian female who is here for scheduled visit. She was last seen 6 months ago. She has cirrhosis secondary to NASH complicated by esophageal varices which have been banded in the past but she did not require banding on her last EGD of November 2017. She was also discovered to have small [place] on routine screening. She underwent thermal ablation by Dr. Aletta Edouard in January 2018. Following therapy there was slight drop in her AFP from 28.5 in November 2017 to 24.2 on 06/20/2016. CT on 06/14/2016 revealed stable subcapsular ablation site in left hepatic lobe with minimal postcontrast enhancement and no new lesions. Other findings included cirrhosis splenomegaly cholelithiasis but no ascites.  She feels fine. She denies abdominal pain nausea vomiting pruritus melena or rectal bleeding. She also denies melena. She states she's been under a lot of stress on account for sisters less who died last week at age 19. Her sister was 2 years younger than her.    Current Medications: Outpatient Encounter Prescriptions as of 10/04/2016  Medication Sig  . albuterol (PROAIR HFA) 108 (90 BASE) MCG/ACT inhaler Inhale 2 puffs into the lungs every 6 (six) hours as needed for wheezing or shortness of breath.  . ALPRAZolam (XANAX) 0.5 MG tablet Take 0.5 mg by mouth daily as needed for anxiety.   . Calcium Carbonate-Vitamin D (CALTRATE 600+D) 600-400 MG-UNIT per tablet Take 1 tablet by mouth daily.    . captopril (CAPOTEN) 25 MG tablet Take 25 mg by mouth 2 (two) times daily.    . furosemide (LASIX) 20 MG tablet Take 20 mg by mouth daily as needed for edema.   Marland Kitchen glipiZIDE (GLUCOTROL XL) 5 MG 24 hr tablet Take 5 mg by mouth daily.    . hydroxypropyl methylcellulose (ISOPTO TEARS) 2.5 % ophthalmic solution Place 1 drop into both eyes daily as needed (Dry Eyes).  . metFORMIN (GLUCOPHAGE-XR) 500 MG 24 hr tablet  Take 1,000 mg by mouth 2 (two) times daily.   . Multiple Vitamins-Minerals (CENTRUM SILVER PO) Take 1 tablet by mouth daily.    . pantoprazole (PROTONIX) 40 MG tablet TAKE 1 TABLET BY MOUTH EVERY DAY (Patient taking differently: Take 64ms daily)  . propranolol (INDERAL) 20 MG tablet TAKE 1 TABLET BY MOUTH TWO TIMES DAILY  . simvastatin (ZOCOR) 40 MG tablet Take 40 mg by mouth daily.    . vitamin C (ASCORBIC ACID) 500 MG tablet Take 500 mg by mouth daily.  . [DISCONTINUED] propranolol (INDERAL) 20 MG tablet TAKE 1 TABLET BY MOUTH TWO TIMES DAILY (Patient not taking: Reported on 10/04/2016)   No facility-administered encounter medications on file as of 10/04/2016.      Objective: Blood pressure 140/84, pulse 67, temperature 97.6 F (36.4 C), temperature source Oral, resp. rate 18, height 5' 3"  (1.6 m), weight 229 lb 1.6 oz (103.9 kg). Patient is alert and does not have asterixis. Conjunctiva is pink. Sclera is nonicteric Oropharyngeal mucosa is normal. No neck masses or thyromegaly noted. Cardiac exam with regular rhythm normal S1 and S2. No murmur or gallop noted. Lungs are clear to auscultation. Abdomen is obese but soft and nontender. Liver spleen not palpable.  No LE edema or clubbing noted.  Labs/studies Results: AFP was 28.5 on 111/26/17 AFP was 25.4 on 04/05/2016  AFP was 24.2 [phone].    Assessment:  #1. Hepatocellular carcinoma in a patient with known history of cirrhosis. Status post thermal ablative therapy in  January 2018. Last alpha-fetoprotein was mildly elevated. She has no symptoms. She is due for reevaluation.  #2. Cirrhosis secondary to NASH. She has required esophageal variceal banding for primary prophylaxis. She has not experienced other sequela such as ascites or hepatic encephalopathy.   Plan:  Patient encouraged to increase physical activity as tolerated. She will go to the lab for AFP and LFTs. Office visit in 6 months.

## 2016-10-05 LAB — HEPATIC FUNCTION PANEL
ALK PHOS: 82 U/L (ref 33–130)
ALT: 17 U/L (ref 6–29)
AST: 28 U/L (ref 10–35)
Albumin: 3.5 g/dL — ABNORMAL LOW (ref 3.6–5.1)
BILIRUBIN DIRECT: 0.3 mg/dL — AB (ref <=0.2)
Indirect Bilirubin: 0.9 mg/dL (ref 0.2–1.2)
Total Bilirubin: 1.2 mg/dL (ref 0.2–1.2)
Total Protein: 6.6 g/dL (ref 6.1–8.1)

## 2016-10-05 LAB — AFP TUMOR MARKER: AFP-Tumor Marker: 45.9 ng/mL — ABNORMAL HIGH (ref <6.1)

## 2016-10-11 ENCOUNTER — Other Ambulatory Visit (INDEPENDENT_AMBULATORY_CARE_PROVIDER_SITE_OTHER): Payer: Self-pay | Admitting: Internal Medicine

## 2016-10-11 DIAGNOSIS — C220 Liver cell carcinoma: Secondary | ICD-10-CM

## 2016-10-11 DIAGNOSIS — K760 Fatty (change of) liver, not elsewhere classified: Secondary | ICD-10-CM

## 2016-10-20 ENCOUNTER — Ambulatory Visit (HOSPITAL_COMMUNITY)
Admission: RE | Admit: 2016-10-20 | Discharge: 2016-10-20 | Disposition: A | Discharge status: Home / Self Care | Payer: Medicare Other | Source: Ambulatory Visit | Attending: Internal Medicine | Admitting: Internal Medicine

## 2016-10-20 DIAGNOSIS — K760 Fatty (change of) liver, not elsewhere classified: Secondary | ICD-10-CM | POA: Diagnosis not present

## 2016-10-20 DIAGNOSIS — R161 Splenomegaly, not elsewhere classified: Secondary | ICD-10-CM | POA: Insufficient documentation

## 2016-10-20 DIAGNOSIS — K7460 Unspecified cirrhosis of liver: Secondary | ICD-10-CM | POA: Diagnosis not present

## 2016-10-20 DIAGNOSIS — K8020 Calculus of gallbladder without cholecystitis without obstruction: Secondary | ICD-10-CM | POA: Insufficient documentation

## 2016-10-20 DIAGNOSIS — C220 Liver cell carcinoma: Secondary | ICD-10-CM | POA: Diagnosis not present

## 2016-10-20 LAB — POCT I-STAT CREATININE: CREATININE: 0.6 mg/dL (ref 0.44–1.00)

## 2016-10-20 MED ORDER — IOPAMIDOL (ISOVUE-300) INJECTION 61%
100.0000 mL | Freq: Once | INTRAVENOUS | Status: AC | PRN
  Administered 2016-10-20: 100 mL via INTRAVENOUS

## 2016-10-24 DIAGNOSIS — F419 Anxiety disorder, unspecified: Secondary | ICD-10-CM | POA: Diagnosis not present

## 2016-10-24 DIAGNOSIS — E782 Mixed hyperlipidemia: Secondary | ICD-10-CM | POA: Diagnosis not present

## 2016-10-24 DIAGNOSIS — I10 Essential (primary) hypertension: Secondary | ICD-10-CM | POA: Diagnosis not present

## 2016-10-24 DIAGNOSIS — E1165 Type 2 diabetes mellitus with hyperglycemia: Secondary | ICD-10-CM | POA: Diagnosis not present

## 2016-10-24 DIAGNOSIS — D649 Anemia, unspecified: Secondary | ICD-10-CM | POA: Diagnosis not present

## 2016-10-24 DIAGNOSIS — E039 Hypothyroidism, unspecified: Secondary | ICD-10-CM | POA: Diagnosis not present

## 2016-10-24 DIAGNOSIS — K219 Gastro-esophageal reflux disease without esophagitis: Secondary | ICD-10-CM | POA: Diagnosis not present

## 2016-10-25 ENCOUNTER — Encounter: Payer: Self-pay | Admitting: *Deleted

## 2016-10-25 ENCOUNTER — Ambulatory Visit (INDEPENDENT_AMBULATORY_CARE_PROVIDER_SITE_OTHER): Payer: Medicare Other | Admitting: Cardiology

## 2016-10-25 ENCOUNTER — Encounter: Payer: Self-pay | Admitting: Cardiology

## 2016-10-25 VITALS — BP 140/78 | HR 80 | Ht 62.0 in | Wt 236.4 lb

## 2016-10-25 DIAGNOSIS — I10 Essential (primary) hypertension: Secondary | ICD-10-CM | POA: Diagnosis not present

## 2016-10-25 DIAGNOSIS — I5032 Chronic diastolic (congestive) heart failure: Secondary | ICD-10-CM | POA: Diagnosis not present

## 2016-10-25 DIAGNOSIS — E782 Mixed hyperlipidemia: Secondary | ICD-10-CM

## 2016-10-25 NOTE — Progress Notes (Signed)
Clinical Summary Ms. Weinrich is a 76 y.o.female seen today for follow up of the following medical problems.   1. Chronic diastolic heart failure.  - echo 10/30/13 LVEF 55-60%, grade II diastolic dysfunction   - no recent LE edema. Some SOB she relates to recent sinus congestion - compliant with meds. Takes lasix about 2 days a week as needed   2. HTN - she is compliant with her home bp meds  3. Liver cirrhosis - followed by GI. Secondary to NASH with thrombocytopenia and leukopenia. +esophageal varices with banding. Notes also mention history of hepatocellular carcinoma.     4. History of breast CA  5. Hyperlipidemia - she remains compliant with statin       Past Medical History:      Past Medical History:  Diagnosis Date  . Arthritis   . Cirrhosis ([place])   . Diabetes mellitus   . Dyspnea    shortness of breath with any exertion  . History of breast cancer   . History of chicken pox   . Hyperlipidemia   . Hypertension   . Thyroid disease      No Known Allergies   Current Outpatient Prescriptions  Medication Sig Dispense Refill  . albuterol (PROAIR HFA) 108 (90 BASE) MCG/ACT inhaler Inhale 2 puffs into the lungs every 6 (six) hours as needed for wheezing or shortness of breath.    . ALPRAZolam (XANAX) 0.5 MG tablet Take 0.5 mg by mouth daily as needed for anxiety.     . Calcium Carbonate-Vitamin D (CALTRATE 600+D) 600-400 MG-UNIT per tablet Take 1 tablet by mouth daily.      . captopril (CAPOTEN) 25 MG tablet Take 25 mg by mouth 2 (two) times daily.      . furosemide (LASIX) 20 MG tablet Take 20 mg by mouth daily as needed for edema.     Marland Kitchen glipiZIDE (GLUCOTROL XL) 5 MG 24 hr tablet Take 5 mg by mouth daily.      . hydroxypropyl methylcellulose (ISOPTO TEARS) 2.5 % ophthalmic solution Place 1 drop into both eyes daily as needed (Dry Eyes).    . metFORMIN (GLUCOPHAGE-XR) 500 MG 24 hr tablet Take 1,000 mg by mouth 2 (two) times daily.     . Multiple  Vitamins-Minerals (CENTRUM SILVER PO) Take 1 tablet by mouth daily.      . pantoprazole (PROTONIX) 40 MG tablet TAKE 1 TABLET BY MOUTH EVERY DAY (Patient taking differently: Take 16ms daily) 30 tablet 5  . propranolol (INDERAL) 20 MG tablet TAKE 1 TABLET BY MOUTH TWO TIMES DAILY 60 tablet 5  . simvastatin (ZOCOR) 40 MG tablet Take 40 mg by mouth daily.      . vitamin C (ASCORBIC ACID) 500 MG tablet Take 500 mg by mouth daily.     No current facility-administered medications for this visit.      Past Surgical History:  Procedure Laterality Date  . ABDOMINAL HYSTERECTOMY  1987  . BREAST BIOPSY  2005  . ESOPHAGEAL BANDING N/A 12/13/2012   Procedure: ESOPHAGEAL BANDING;  Surgeon: NRogene Houston MD;  [facility];  Service: Endoscopy;  Laterality: N/A;  . ESOPHAGOGASTRODUODENOSCOPY N/A 09/20/2012   Procedure: ESOPHAGOGASTRODUODENOSCOPY (EGD);  Surgeon: NRogene Houston MD;  [facility];  Service: Endoscopy;  Laterality: N/A;  125-moved to 12:00 Ann notified pt  . ESOPHAGOGASTRODUODENOSCOPY N/A 12/13/2012   Procedure: ESOPHAGOGASTRODUODENOSCOPY (EGD);  Surgeon: NRogene Houston MD;  [facility];  Service: Endoscopy;  Laterality: N/A;  155-moved to 220 Ann to notify pt  . ESOPHAGOGASTRODUODENOSCOPY N/A 01/11/2016   Procedure: ESOPHAGOGASTRODUODENOSCOPY (EGD);  Surgeon: Rogene Houston, MD;  [facility];  Service: Endoscopy;  Laterality: N/A;  7:30  . EYE SURGERY     bilateral cataract surgery with lens implants  . HERNIA REPAIR  2004  . IR GENERIC HISTORICAL  01/19/2016   IR RADIOLOGIST EVAL & MGMT 01/19/2016 Aletta Edouard, MD [facility] INTER[facility] RAD  . IR GENERIC HISTORICAL  03/29/2016   IR RADIOLOGIST EVAL & MGMT 03/29/2016 Aletta Edouard, MD [facility]  . IR RADIOLOGIST EVAL & MGMT  06/14/2016  . RADIOFREQUENCY ABLATION N/A 03/11/2016   Procedure: LIVER THERMAL ABLATION;  Surgeon: Aletta Edouard, MD;  Location: [facility];  Service: Anesthesiology;   Laterality: N/A;  . thyroid treatment     radioactive iodine treatment     No Known Allergies    Family History  Problem Relation Age of Onset  . Dementia Sister   . Dementia Sister   . Alzheimer's disease Sister   . Liver disease Brother   . Cancer Other        Colon Cancer, Prostate Cancer-Other Blood Relative  . Hyperlipidemia Other        Other Blood Relative  . Stroke Other        Parent, Other Blood Relative  . Diabetes Other        Parent, Other Blood Relative  . Hypertension Other        Parent  . Prostate cancer Maternal Aunt   . Dementia Maternal Aunt      Social History Ms. Delancey reports that she has never smoked. She has never used smokeless tobacco. Ms. Ottey reports that she does not drink alcohol.   Review of Systems CONSTITUTIONAL: No weight loss, fever, chills, weakness or fatigue.  HEENT: Eyes: No visual loss, blurred vision, double vision or yellow sclerae.No hearing loss, sneezing, congestion, runny nose or sore throat.  SKIN: No rash or itching.  CARDIOVASCULAR: per hpi RESPIRATORY: per hpi GASTROINTESTINAL: No anorexia, nausea, vomiting or diarrhea. No abdominal pain or blood.  GENITOURINARY: No burning on urination, no polyuria NEUROLOGICAL: No headache, dizziness, syncope, paralysis, ataxia, numbness or tingling in the extremities. No change in bowel or bladder control.  MUSCULOSKELETAL: No muscle, back pain, joint pain or stiffness.  LYMPHATICS: No enlarged nodes. No history of splenectomy.  PSYCHIATRIC: No history of depression or anxiety.  ENDOCRINOLOGIC: No reports of sweating, cold or heat intolerance. No polyuria or polydipsia.  Marland Kitchen   Physical Examination Vitals:   10/25/16 1527  BP: 140/78  Pulse: 80  SpO2: 96%   Vitals:   10/25/16 1527  Weight: 236 lb 6.4 oz (107.2 kg)  Height: 5' 2"  (1.575 m)    Gen: resting comfortably, no acute distress HEENT: no scleral icterus, pupils equal round and reactive, no palptable cervical  adenopathy,  CV: RRR, 2/6 systolic murmur at apex, no jvd  Resp: Clear to auscultation bilaterally GI: abdomen is soft, non-tender, non-distended, normal bowel sounds, no hepatosplenomegaly MSK: extremities are warm, no edema.  Skin: warm, no rash Neuro:  no focal deficits Psych: appropriate affect   Diagnostic Studies 10/30/13 Echo Study Conclusions  - Left ventricle: The cavity size was normal. Wall thickness was normal. Systolic function was normal. The estimated ejection fraction was in the range of 55% to 60%. Wall motion was normal; there were no regional wall motion abnormalities. Features are consistent with a pseudonormal left ventricular  filling pattern, with concomitant abnormal relaxation and increased filling pressure (grade 2 diastolic dysfunction). - Aortic valve: Mildly to moderately calcified annulus. Trileaflet; mildly calcified leaflets. There was no significant regurgitation. - Mitral valve: Moderately calcified annulus. There was mild regurgitation. - Left atrium: The atrium was moderately to severely dilated. - Right ventricle: The cavity size was mildly dilated. - Right atrium: The atrium was moderately dilated. - Tricuspid valve: There was trivial regurgitation. - Pulmonary arteries: Systolic pressure could not be accurately estimated. - Inferior vena cava: Not visualized. Unable to estimate CVP. - Pericardium, extracardiac: There was no pericardial effusion.  Impressions:  - Overall normal LV wall thickness and chamber size with LVEF 55-60%. Grade 2 diastolic dysfunction. Moderate to severe left atrial enlargement. Moderately calcified mitral annulus with mild mitral regurgitation. Mildly sclerotic aortic valve. Mildly dilated RV with normal contraction. Moderate right atrial enlargement. Trivial tricuspid regurgitation, unable to assess PASP or CVP.  10/2013 PFTs: moderate obstruction    Assessment and Plan  1. Chronic diastolic heart  failure - overall doing well, she will continue current diuretics  2. HTN - her bp is at goal, she will contniue current meds  3. Hyperlipidemia - we will request labs from pcp - continue statin    F/u 1 year      Arnoldo Lenis, M.D.

## 2016-10-25 NOTE — Patient Instructions (Signed)

## 2016-10-27 DIAGNOSIS — E782 Mixed hyperlipidemia: Secondary | ICD-10-CM | POA: Diagnosis not present

## 2016-10-27 DIAGNOSIS — Z23 Encounter for immunization: Secondary | ICD-10-CM | POA: Diagnosis not present

## 2016-10-27 DIAGNOSIS — I10 Essential (primary) hypertension: Secondary | ICD-10-CM | POA: Diagnosis not present

## 2016-10-27 DIAGNOSIS — Z6841 Body Mass Index (BMI) 40.0 and over, adult: Secondary | ICD-10-CM | POA: Diagnosis not present

## 2016-10-27 DIAGNOSIS — K7460 Unspecified cirrhosis of liver: Secondary | ICD-10-CM | POA: Diagnosis not present

## 2016-10-27 DIAGNOSIS — E1165 Type 2 diabetes mellitus with hyperglycemia: Secondary | ICD-10-CM | POA: Diagnosis not present

## 2016-10-27 DIAGNOSIS — D696 Thrombocytopenia, unspecified: Secondary | ICD-10-CM | POA: Diagnosis not present

## 2016-10-27 DIAGNOSIS — I5030 Unspecified diastolic (congestive) heart failure: Secondary | ICD-10-CM | POA: Diagnosis not present

## 2016-10-31 ENCOUNTER — Other Ambulatory Visit (INDEPENDENT_AMBULATORY_CARE_PROVIDER_SITE_OTHER): Payer: Self-pay | Admitting: Internal Medicine

## 2016-10-31 DIAGNOSIS — K760 Fatty (change of) liver, not elsewhere classified: Secondary | ICD-10-CM

## 2016-10-31 DIAGNOSIS — C220 Liver cell carcinoma: Secondary | ICD-10-CM

## 2016-11-01 ENCOUNTER — Other Ambulatory Visit (INDEPENDENT_AMBULATORY_CARE_PROVIDER_SITE_OTHER): Payer: Self-pay | Admitting: Internal Medicine

## 2016-11-01 DIAGNOSIS — C220 Liver cell carcinoma: Secondary | ICD-10-CM

## 2016-11-09 ENCOUNTER — Ambulatory Visit (HOSPITAL_COMMUNITY): Payer: Medicare Other

## 2016-11-15 ENCOUNTER — Ambulatory Visit
Admission: RE | Admit: 2016-11-15 | Discharge: 2016-11-15 | Disposition: A | Discharge status: Home / Self Care | Payer: Medicare Other | Source: Ambulatory Visit | Attending: Internal Medicine | Admitting: Internal Medicine

## 2016-11-15 DIAGNOSIS — C220 Liver cell carcinoma: Secondary | ICD-10-CM

## 2016-11-15 MED ORDER — GADOXETATE DISODIUM 0.25 MMOL/ML IV SOLN: 10.0000 mL | Freq: Once | INTRAVENOUS | Status: DC | PRN

## 2016-11-21 ENCOUNTER — Other Ambulatory Visit (HOSPITAL_COMMUNITY): Payer: Self-pay | Admitting: Interventional Radiology

## 2016-11-21 DIAGNOSIS — L570 Actinic keratosis: Secondary | ICD-10-CM | POA: Diagnosis not present

## 2016-11-21 DIAGNOSIS — K769 Liver disease, unspecified: Secondary | ICD-10-CM

## 2016-11-24 ENCOUNTER — Other Ambulatory Visit (HOSPITAL_COMMUNITY): Payer: Self-pay | Admitting: Interventional Radiology

## 2016-11-24 ENCOUNTER — Ambulatory Visit
Admission: RE | Admit: 2016-11-24 | Discharge: 2016-11-24 | Disposition: A | Discharge status: Home / Self Care | Payer: Medicare Other | Source: Ambulatory Visit | Attending: Interventional Radiology | Admitting: Interventional Radiology

## 2016-11-24 ENCOUNTER — Other Ambulatory Visit: Payer: Self-pay | Admitting: *Deleted

## 2016-11-24 DIAGNOSIS — C220 Liver cell carcinoma: Secondary | ICD-10-CM | POA: Diagnosis not present

## 2016-11-24 DIAGNOSIS — K760 Fatty (change of) liver, not elsewhere classified: Secondary | ICD-10-CM

## 2016-11-24 DIAGNOSIS — K769 Liver disease, unspecified: Secondary | ICD-10-CM

## 2016-11-24 DIAGNOSIS — R772 Abnormality of alphafetoprotein: Secondary | ICD-10-CM | POA: Diagnosis not present

## 2016-11-24 HISTORY — PX: IR RADIOLOGIST EVAL & MGMT: IMG5224

## 2016-11-24 NOTE — Progress Notes (Signed)
Chief Complaint: Follow-up after thermal ablation of hepatocellular carcinoma on 03/11/2016 with rising AFP level.  History of Present Illness: Cheryl Velasquez is a 76 y.o. female with a history of cirrhosis secondary to NASH and development of a hepatocellular carcinoma in segment 4A of the left lobe of the liver measuring 2.1 x 3.0 cm. This was treated by percutaneous microwave thermal ablation on 03/11/2016. Cheryl Velasquez did well after the procedure with post procedural imaging demonstrating well-circumscribed ablation defect at the level of the treated tumor and initial AFP level showing slight decrease and stabilization. More recently, there has been a significant increase in AFP level from May at which time it was 24.2 until 10/04/2016 with AFP of 45.9. CT on 10/20/2016 did not demonstrate evidence of convincing recurrent hepatocellular carcinoma or new enhancing lesion within the liver. MRI on 11/15/2016 demonstrates subtle ovoid enhancement along the inferior margin of previous ablation which appeared to be somewhat indeterminate but potentially consistent with marginal recurrence of HCC. No additional enhancing hepatic lesions are identified by MRI.  Cheryl Velasquez has been asymptomatic and denies any abdominal pain, melena, hematemesis or increased abdominal distention. Imaging is not show evidence of ascites.  Past Medical History:  Diagnosis Date  . Arthritis   . Cirrhosis ([place])   . Diabetes mellitus   . Dyspnea    shortness of breath with any exertion  . History of breast cancer   . History of chicken pox   . Hyperlipidemia   . Hypertension   . Thyroid disease     Past Surgical History:  Procedure Laterality Date  . ABDOMINAL HYSTERECTOMY  1987  . BREAST BIOPSY  2005  . ESOPHAGEAL BANDING N/A 12/13/2012   Procedure: ESOPHAGEAL BANDING;  Surgeon: Rogene Houston, MD;  [facility];  Service: Endoscopy;  Laterality: N/A;  . ESOPHAGOGASTRODUODENOSCOPY N/A 09/20/2012   Procedure: ESOPHAGOGASTRODUODENOSCOPY (EGD);  Surgeon: Rogene Houston, MD;  [facility];  Service: Endoscopy;  Laterality: N/A;  125-moved to 12:00 Ann notified pt  . ESOPHAGOGASTRODUODENOSCOPY N/A 12/13/2012   Procedure: ESOPHAGOGASTRODUODENOSCOPY (EGD);  Surgeon: Rogene Houston, MD;  [facility];  Service: Endoscopy;  Laterality: N/A;  155-moved to 220 Ann to notify pt  . ESOPHAGOGASTRODUODENOSCOPY N/A 01/11/2016   Procedure: ESOPHAGOGASTRODUODENOSCOPY (EGD);  Surgeon: Rogene Houston, MD;  [facility];  Service: Endoscopy;  Laterality: N/A;  7:30  . EYE SURGERY     bilateral cataract surgery with lens implants  . HERNIA REPAIR  2004  . IR GENERIC HISTORICAL  01/19/2016   IR RADIOLOGIST EVAL & MGMT 01/19/2016 Aletta Edouard, MD [facility] INTERV RAD  . IR GENERIC HISTORICAL  03/29/2016   IR RADIOLOGIST EVAL & MGMT 03/29/2016 Aletta Edouard, MD [facility] INTERV RAD  . IR RADIOLOGIST EVAL & MGMT  06/14/2016  . RADIOFREQUENCY ABLATION N/A 03/11/2016   Procedure: LIVER THERMAL ABLATION;  Surgeon: Aletta Edouard, MD;  Location: [facility];  Service: Anesthesiology;  Laterality: N/A;  . thyroid treatment     radioactive iodine treatment    Allergies: Patient has no known allergies.  Medications: Prior to Admission medications   Medication Sig Start Date End Date Taking? Authorizing Provider  albuterol (PROAIR HFA) 108 (90 BASE) MCG/ACT inhaler Inhale 2 puffs into the lungs every 6 (six) hours as needed for wheezing or shortness of breath.   Yes [provider]  ALPRAZolam Duanne Moron) 0.5 MG tablet Take 0.5 mg by mouth daily as needed for anxiety.  Yes [provider]  Calcium Carbonate-Vitamin D (CALTRATE 600+D) 600-400 MG-UNIT per tablet Take 1 tablet by mouth daily.     Yes [provider]  captopril (CAPOTEN) 25 MG tablet Take 25 mg by mouth 2 (two) times daily.     Yes [provider]  furosemide (LASIX) 20 MG tablet Take 20 mg by  mouth daily as needed for edema.  10/07/13  Yes [provider]  glipiZIDE (GLUCOTROL XL) 5 MG 24 hr tablet Take 5 mg by mouth daily.     Yes [provider]  hydroxypropyl methylcellulose (ISOPTO TEARS) 2.5 % ophthalmic solution Place 1 drop into both eyes daily as needed (Dry Eyes).   Yes [provider]  metFORMIN (GLUCOPHAGE-XR) 500 MG 24 hr tablet Take 1,000 mg by mouth 2 (two) times daily.    Yes [provider]  Multiple Vitamins-Minerals (CENTRUM SILVER PO) Take 1 tablet by mouth daily.     Yes [provider]  pantoprazole (PROTONIX) 40 MG tablet TAKE 1 TABLET BY MOUTH EVERY DAY Patient taking differently: Take 79ms daily 11/24/15  Yes Setzer, Terri L, NP  propranolol (INDERAL) 20 MG tablet TAKE 1 TABLET BY MOUTH TWO TIMES DAILY 08/30/16  Yes Rehman, NMechele Dawley MD  simvastatin (ZOCOR) 40 MG tablet Take 40 mg by mouth daily.     Yes [provider]  vitamin C (ASCORBIC ACID) 500 MG tablet Take 500 mg by mouth daily.   Yes [provider]     Family History  Problem Relation Age of Onset  . Dementia Sister   . Dementia Sister   . Alzheimer's disease Sister   . Liver disease Brother   . Cancer Other        Colon Cancer, Prostate Cancer-Other Blood Relative  . Hyperlipidemia Other        Other Blood Relative  . Stroke Other        Parent, Other Blood Relative  . Diabetes Other        Parent, Other Blood Relative  . Hypertension Other        Parent  . Prostate cancer Maternal Aunt   . Dementia Maternal Aunt     Social History   Social History  . Marital status: Single    Spouse name: N/A  . Number of children: 0  . Years of education: 10   Occupational History  . Textiles    Social History Main Topics  . Smoking status: Never Smoker  . Smokeless tobacco: Never Used  . Alcohol use No  . Drug use: No  . Sexual activity: No   Other Topics Concern  . Not on file   Social History Narrative   Regular  Exercise-no    ECOG Status: 0 - Asymptomatic  Review of Systems: A 12 point ROS discussed and pertinent positives are indicated in the HPI above.  All other systems are negative.  Review of Systems  Constitutional: Negative.   HENT: Negative.   Respiratory: Negative.   Cardiovascular: Negative.   Gastrointestinal: Negative.   Genitourinary: Negative.   Musculoskeletal: Negative.   Neurological: Negative.   Hematological: Negative.     Vital Signs: BP (!) 166/76   Pulse 76   Temp 98.2 F (36.8 C) (Oral)   Resp 17   Ht 5' 2"  (1.575 m)   Wt 230 lb (104.3 kg)   SpO2 96%   BMI 42.07 kg/m   Physical Exam  Constitutional: She is oriented to person, place, and  time. No distress.  Abdominal: Soft. She exhibits no distension. There is no tenderness.  Neurological: She is alert and oriented to person, place, and time.  Skin: She is not diaphoretic.  Vitals reviewed.   Imaging: Mr Abdomen W Wo Contrast  Result Date: 11/15/2016 CLINICAL DATA:  cirrhosis. Hepatocellular carcinoma. Patient status post solitary [facility] ablation January 2018. Mildly elevated AFP. EXAM: MRI ABDOMEN WITHOUT AND WITH CONTRAST TECHNIQUE: Multiplanar multisequence MR imaging of the abdomen was performed both before and after the administration of intravenous contrast. CONTRAST:  10 mL Eovist COMPARISON:  CT 10/20/2016, MRI 01/18/2016 FINDINGS: Lower chest:  Lung bases are clear. Hepatobiliary: Minimal loss of signal intensity on opposed phase imaging to suggest hepatic steatosis. Postcontrast imaging demonstrates ovoid focus of enhancement along the inferior margin of the ablation site which is slightly greater intensity than adjacent liver parenchyma and measures 20 mm on image 23, series 13. Arc like artifact bands related to breathing artifact extent the liver which makes evaluation difficult. Lesion not well depicted on the vascular series. On the delayed 20 minute post contrast EOVIST imaging, no convincing  hepatoma at the ablation site or elsewhere in the liver. There is morphologic changes of liver consistent with cirrhosis including lobular contour and enlarged caudate lobe. Small amount fluid along the RIGHT hepatic margin not changed from prior per There is small amount of sludge within the lumen gallbladder. The common bile duct normal caliber. Pancreas: Normal pancreatic parenchymal intensity. No ductal dilatation or inflammation. Small cyst adjacent to the uncinate processes 10 mm (image 23, series 17) and not changed from comparison exam Spleen: Spleen is enlarged. Splenic vein and portal vein are patent. Adrenals/urinary tract: Adrenal glands and kidneys are normal. Stomach/Bowel: Stomach and limited of the small bowel is unremarkable Vascular/Lymphatic: Abdominal aortic normal caliber. No retroperitoneal periportal lymphadenopathy. Musculoskeletal: No aggressive osseous lesion IMPRESSION: *Subtle ovoid enhancement along the inferior margin of the ablation site is indeterminate but warrants close attention on follow-up. *No additional foci of hepatocellular carcinoma within liver. *Splenomegaly. Morphologic changes in liver consistent with cirrhosis. Electronically Signed   By: Suzy Bouchard M.D.   On: 11/15/2016 12:13    Labs:  CBC:  Recent Labs  03/10/16 1100 03/11/16 0916 03/12/16 0750 04/19/16 0929  WBC 2.3* 3.6* 4.4 3.6*  HGB 12.1 12.6 12.7 13.5  HCT 35.6* 36.6 37.0 39.7  PLT 54* 63* 78* 68*    COAGS:  Recent Labs  03/10/16 1100  INR 1.24    BMP:  Recent Labs  01/18/16 1635 03/10/16 1100 03/12/16 0750 04/19/16 0929 06/14/16 1041 10/20/16 1532  NA  --  137 137 138  --   --   K  --  4.2 3.7 4.0  --   --   CL  --  106 104 105  --   --   CO2  --  25 25 27   --   --   GLUCOSE  --  220* 202* 242*  --   --   BUN  --  9 12 7   --   --   CALCIUM  --  9.1 8.4* 8.8*  --   --   CREATININE 0.79 0.58 0.75 0.94 0.70 0.60  GFRNONAA >60 >60 >60 57*  --   --   GFRAA >60 >60  >60 >60  --   --     LIVER FUNCTION TESTS:  Recent Labs  03/10/16 1100 03/12/16 0750 04/19/16 0929 10/04/16 1320  BILITOT 0.9 1.1 1.3* 1.2  AST  37 115* 32 28  ALT 24 50 19 17  ALKPHOS 57 55 73 82  PROT 6.5 6.6 7.2 6.6  ALBUMIN 3.3* 3.4* 3.3* 3.5*    TUMOR MARKERS:  Recent Labs  12/18/15 1130 04/05/16 1317 06/20/16 0932 10/04/16 1320  AFPTM 28.5* 25.4* 24.2* 45.9*    Assessment and Plan:  I met with Cheryl Velasquez and her niece Cheryl Velasquez. We reviewed imaging studies including the most recent MRI exam. The region of potential ovoid enhancement along the inferior margin of prior ablation is quite subtle by MRI and also borders an area of artifact.  Given significant increase in AFP level, there is definite suspicion of HCC recurrence clinically. However, I am not totally convinced by the MRI imaging of a definitive target for repeat ablation. After discussion, I have recommended that we repeat a multiphase CT scan of the abdomen with contrast towards the end of October and also recheck AFP level at that time. Should there be a better enhancing target defined by CT and continued rise/elevation of AFP, there would be an indication to perform ablation at the site of probable recurrence. If the follow-up CT scan is more convincing, I told Cheryl Velasquez that we could perform ablation of the liver in November.  We will coordinate a CT of the abdomen at [facility] and also have her AFP level rechecked on the same day towards the end of this month.  Electronically SignedAletta Edouard T 11/24/2016, 10:44 AM   I spent a total of 25 Minutes in face to face in clinical consultation, greater than 50% of which was counseling/coordinating care for treatment of hepatocellular carcinoma.

## 2016-12-12 ENCOUNTER — Other Ambulatory Visit (HOSPITAL_COMMUNITY)
Admission: RE | Admit: 2016-12-12 | Discharge: 2016-12-12 | Disposition: A | Discharge status: Home / Self Care | Payer: Medicare Other | Source: Ambulatory Visit | Attending: Interventional Radiology | Admitting: Interventional Radiology

## 2016-12-12 ENCOUNTER — Ambulatory Visit (HOSPITAL_COMMUNITY)
Admission: RE | Admit: 2016-12-12 | Discharge: 2016-12-12 | Disposition: A | Discharge status: Home / Self Care | Payer: Medicare Other | Source: Ambulatory Visit | Attending: Interventional Radiology | Admitting: Interventional Radiology

## 2016-12-12 DIAGNOSIS — K760 Fatty (change of) liver, not elsewhere classified: Secondary | ICD-10-CM | POA: Diagnosis not present

## 2016-12-12 DIAGNOSIS — J9811 Atelectasis: Secondary | ICD-10-CM | POA: Diagnosis not present

## 2016-12-12 DIAGNOSIS — K8020 Calculus of gallbladder without cholecystitis without obstruction: Secondary | ICD-10-CM | POA: Diagnosis not present

## 2016-12-12 DIAGNOSIS — R161 Splenomegaly, not elsewhere classified: Secondary | ICD-10-CM | POA: Insufficient documentation

## 2016-12-12 DIAGNOSIS — K769 Liver disease, unspecified: Secondary | ICD-10-CM | POA: Diagnosis not present

## 2016-12-12 DIAGNOSIS — K4090 Unilateral inguinal hernia, without obstruction or gangrene, not specified as recurrent: Secondary | ICD-10-CM | POA: Diagnosis not present

## 2016-12-12 DIAGNOSIS — C220 Liver cell carcinoma: Secondary | ICD-10-CM | POA: Insufficient documentation

## 2016-12-12 DIAGNOSIS — K766 Portal hypertension: Secondary | ICD-10-CM | POA: Insufficient documentation

## 2016-12-12 DIAGNOSIS — J90 Pleural effusion, not elsewhere classified: Secondary | ICD-10-CM | POA: Insufficient documentation

## 2016-12-12 DIAGNOSIS — K7460 Unspecified cirrhosis of liver: Secondary | ICD-10-CM | POA: Insufficient documentation

## 2016-12-12 LAB — POCT I-STAT CREATININE: Creatinine, Ser: 0.7 mg/dL (ref 0.44–1.00)

## 2016-12-12 MED ORDER — IOPAMIDOL (ISOVUE-300) INJECTION 61%
100.0000 mL | Freq: Once | INTRAVENOUS | Status: AC | PRN
  Administered 2016-12-12: 100 mL via INTRAVENOUS

## 2016-12-13 ENCOUNTER — Other Ambulatory Visit (HOSPITAL_COMMUNITY): Payer: Self-pay | Admitting: Interventional Radiology

## 2016-12-13 ENCOUNTER — Ambulatory Visit
Admission: RE | Admit: 2016-12-13 | Discharge: 2016-12-13 | Disposition: A | Discharge status: Home / Self Care | Payer: Medicare Other | Source: Ambulatory Visit | Attending: Interventional Radiology | Admitting: Interventional Radiology

## 2016-12-13 DIAGNOSIS — K760 Fatty (change of) liver, not elsewhere classified: Secondary | ICD-10-CM

## 2016-12-13 DIAGNOSIS — R772 Abnormality of alphafetoprotein: Secondary | ICD-10-CM | POA: Diagnosis not present

## 2016-12-13 DIAGNOSIS — C220 Liver cell carcinoma: Secondary | ICD-10-CM | POA: Diagnosis not present

## 2016-12-13 DIAGNOSIS — K769 Liver disease, unspecified: Secondary | ICD-10-CM

## 2016-12-13 HISTORY — PX: IR RADIOLOGIST EVAL & MGMT: IMG5224

## 2016-12-13 LAB — AFP TUMOR MARKER: AFP, Serum, Tumor Marker: 89.7 ng/mL — ABNORMAL HIGH (ref 0.0–8.3)

## 2016-12-14 ENCOUNTER — Encounter (INDEPENDENT_AMBULATORY_CARE_PROVIDER_SITE_OTHER): Payer: Self-pay | Admitting: *Deleted

## 2016-12-14 ENCOUNTER — Encounter: Payer: Self-pay | Admitting: Interventional Radiology

## 2016-12-29 NOTE — Progress Notes (Signed)
Patient ID: Darrin Luis, female   DOB: 05-17-1940, 76 y.o.   MRN: [id number]   Interventional Radiology:  Mrs. Blinn and her niece Vaughan Basta returned for discussion regarding recent CT findings and correlation with AFP level. Her AFP continues to rise with latest AFP of 89.7 on 10/29. Prior AFP was 45.9 on 8/21 and 24.2 on 5/7. Multiphase CT on 10/29 illustrates no definitive enhancing recurrence of hepatocellular carcinoma at the level of prior ablation or new enhancing lesions elsewhere in the liver. The area of possible suspected recurrence along the inferior margin of ablation by MRI is not convincingly demonstrated by CT.  Although rising AFP level is concerning for residual/new [place] in the liver, there is not a convincing imaging finding to warrant repeat ablation at this time. I recommended continued close surveillance and a repeat MRI of the abdomen in December. I will meet with Mrs. Gillentine after the MRI.  Venetia Night. Kathlene Cote, M.D Pager:  [phone]    I spent 15 minutes in face to face time with the patient today strictly in a counseling role.

## 2017-01-04 ENCOUNTER — Encounter: Payer: Self-pay | Admitting: Interventional Radiology

## 2017-01-09 ENCOUNTER — Other Ambulatory Visit (INDEPENDENT_AMBULATORY_CARE_PROVIDER_SITE_OTHER): Payer: Self-pay | Admitting: Internal Medicine

## 2017-01-18 ENCOUNTER — Other Ambulatory Visit (HOSPITAL_COMMUNITY): Payer: Self-pay | Admitting: Interventional Radiology

## 2017-01-18 ENCOUNTER — Ambulatory Visit (HOSPITAL_COMMUNITY)
Admission: RE | Admit: 2017-01-18 | Discharge: 2017-01-18 | Disposition: A | Discharge status: Home / Self Care | Payer: Medicare Other | Source: Ambulatory Visit | Attending: Interventional Radiology | Admitting: Interventional Radiology

## 2017-01-18 ENCOUNTER — Encounter (HOSPITAL_COMMUNITY): Payer: Self-pay | Admitting: Interventional Radiology

## 2017-01-18 DIAGNOSIS — D499 Neoplasm of unspecified behavior of unspecified site: Secondary | ICD-10-CM

## 2017-01-18 DIAGNOSIS — C220 Liver cell carcinoma: Secondary | ICD-10-CM | POA: Insufficient documentation

## 2017-01-18 DIAGNOSIS — K766 Portal hypertension: Secondary | ICD-10-CM | POA: Insufficient documentation

## 2017-01-18 DIAGNOSIS — I872 Venous insufficiency (chronic) (peripheral): Secondary | ICD-10-CM | POA: Diagnosis not present

## 2017-01-18 DIAGNOSIS — K760 Fatty (change of) liver, not elsewhere classified: Secondary | ICD-10-CM

## 2017-01-18 DIAGNOSIS — K7460 Unspecified cirrhosis of liver: Secondary | ICD-10-CM | POA: Diagnosis not present

## 2017-01-18 DIAGNOSIS — R161 Splenomegaly, not elsewhere classified: Secondary | ICD-10-CM | POA: Insufficient documentation

## 2017-01-18 HISTORY — PX: IR RADIOLOGY PERIPHERAL GUIDED IV START: IMG5598

## 2017-01-18 HISTORY — PX: IR US GUIDE VASC ACCESS LEFT: IMG2389

## 2017-01-18 LAB — CREATININE, SERUM
Creatinine, Ser: 0.71 mg/dL (ref 0.44–1.00)
GFR calc non Af Amer: >60 mL/min (ref >60)

## 2017-01-18 MED ORDER — GADOXETATE DISODIUM 0.25 MMOL/ML IV SOLN
10.0000 mL | Freq: Once | INTRAVENOUS | Status: AC
  Administered 2017-01-18: 10 mL via INTRAVENOUS

## 2017-01-18 MED ORDER — LIDOCAINE 2% (20 MG/ML) 5 ML SYRINGE
INTRAMUSCULAR | Status: AC
  Filled 2017-01-18: qty 10

## 2017-01-19 ENCOUNTER — Telehealth: Payer: Self-pay | Admitting: Radiology

## 2017-01-19 ENCOUNTER — Other Ambulatory Visit: Payer: Self-pay | Admitting: Radiology

## 2017-01-19 ENCOUNTER — Encounter: Payer: Self-pay | Admitting: Radiology

## 2017-01-19 DIAGNOSIS — C220 Liver cell carcinoma: Secondary | ICD-10-CM

## 2017-01-19 NOTE — Telephone Encounter (Signed)
Vaughan Basta (patient's niece) returned call to review MR results of 01/18/2017.  Per Dr Kathlene Cote:  Liver ablation site stable.  Will repeat AFP late Dec 2018.  Lab order will be mailed to patient.    Eugenia Eldredge [place], RN 01/19/2017 1:28 PM

## 2017-01-19 NOTE — Telephone Encounter (Signed)
Left msg requesting call back to review MR of 01/18/2017.  Amir Fick Riki Rusk, RN 01/19/2017 11:31 AM

## 2017-01-26 ENCOUNTER — Encounter (HOSPITAL_COMMUNITY): Payer: Self-pay | Admitting: General Surgery

## 2017-01-31 DIAGNOSIS — E039 Hypothyroidism, unspecified: Secondary | ICD-10-CM | POA: Diagnosis not present

## 2017-02-09 ENCOUNTER — Other Ambulatory Visit (HOSPITAL_COMMUNITY)
Admission: RE | Admit: 2017-02-09 | Discharge: 2017-02-09 | Disposition: A | Discharge status: Home / Self Care | Payer: Medicare Other | Source: Ambulatory Visit | Attending: Interventional Radiology | Admitting: Interventional Radiology

## 2017-02-09 DIAGNOSIS — C220 Liver cell carcinoma: Secondary | ICD-10-CM | POA: Insufficient documentation

## 2017-02-10 LAB — AFP TUMOR MARKER: AFP, Serum, Tumor Marker: 137 ng/mL — ABNORMAL HIGH (ref 0.0–8.3)

## 2017-02-16 ENCOUNTER — Other Ambulatory Visit (HOSPITAL_COMMUNITY): Payer: Self-pay | Admitting: Interventional Radiology

## 2017-02-16 DIAGNOSIS — C220 Liver cell carcinoma: Secondary | ICD-10-CM

## 2017-03-15 ENCOUNTER — Encounter: Payer: Self-pay | Admitting: *Deleted

## 2017-03-15 ENCOUNTER — Ambulatory Visit
Admission: RE | Admit: 2017-03-15 | Discharge: 2017-03-15 | Disposition: A | Discharge status: Home / Self Care | Payer: Medicare Other | Source: Ambulatory Visit | Attending: Interventional Radiology | Admitting: Interventional Radiology

## 2017-03-15 DIAGNOSIS — C220 Liver cell carcinoma: Secondary | ICD-10-CM | POA: Diagnosis not present

## 2017-03-15 DIAGNOSIS — R772 Abnormality of alphafetoprotein: Secondary | ICD-10-CM | POA: Diagnosis not present

## 2017-03-15 HISTORY — PX: IR RADIOLOGIST EVAL & MGMT: IMG5224

## 2017-03-15 NOTE — Progress Notes (Signed)
Referring Physician(s): Rehman,N  Chief Complaint: The patient is seen in follow up today s/p thermal ablation of segment  4A hepatocellular carcinoma 03/11/16 with rising AFP level  History of present illness: JAZZMYNE RASNICK is a 77 y.o. female with a history of cirrhosis secondary to NASH and development of a hepatocellular carcinoma in segment 4A of the left lobe of the liver measuring 2.1 x 3.0 cm. This was treated by percutaneous microwave thermal ablation on 03/11/2016. Mrs. Bynum did well after the procedure with post procedural imaging demonstrating well-circumscribed ablation defect at the level of the treated tumor and initial AFP level showing slight decrease and stabilization.  She presents again today for routine follow-up and discussion of most recent MRI abdomen done on 01/18/17.  Latest AFP on 02/09/17 was 137, up from 89.7  3 months ago.  She denies fever, headache, chest pain, abdominal/back pain, nausea,  vomiting or bleeding.  She does have some dyspnea with exertion , occ cough and "tickle in throat".    Past Medical History:  Diagnosis Date  . Arthritis   . Cirrhosis ([place])   . Diabetes mellitus   . Dyspnea    shortness of breath with any exertion  . History of breast cancer   . History of chicken pox   . Hyperlipidemia   . Hypertension   . Thyroid disease     Past Surgical History:  Procedure Laterality Date  . ABDOMINAL HYSTERECTOMY  1987  . BREAST BIOPSY  2005  . ESOPHAGEAL BANDING N/A 12/13/2012   Procedure: ESOPHAGEAL BANDING;  Surgeon: Rogene Houston, MD;  [facility];  Service: Endoscopy;  Laterality: N/A;  . ESOPHAGOGASTRODUODENOSCOPY N/A 09/20/2012   Procedure: ESOPHAGOGASTRODUODENOSCOPY (EGD);  Surgeon: Rogene Houston, MD;  [facility];  Service: Endoscopy;  Laterality: N/A;  125-moved to 12:00 Ann notified pt  . ESOPHAGOGASTRODUODENOSCOPY N/A 12/13/2012   Procedure: ESOPHAGOGASTRODUODENOSCOPY (EGD);  Surgeon: Rogene Houston,  MD;  [facility];  Service: Endoscopy;  Laterality: N/A;  155-moved to 220 Ann to notify pt  . ESOPHAGOGASTRODUODENOSCOPY N/A 01/11/2016   Procedure: ESOPHAGOGASTRODUODENOSCOPY (EGD);  Surgeon: Rogene Houston, MD;  [facility];  Service: Endoscopy;  Laterality: N/A;  7:30  . EYE SURGERY     bilateral cataract surgery with lens implants  . HERNIA REPAIR  2004  . IR GENERIC HISTORICAL  01/19/2016   IR RADIOLOGIST EVAL & MGMT 01/19/2016 Aletta Edouard, MD [facility] INTERV RAD  . IR GENERIC HISTORICAL  03/29/2016   IR RADIOLOGIST EVAL & MGMT 03/29/2016 Aletta Edouard, MD [facility] INTERV RAD  . IR RADIOLOGIST EVAL & MGMT  06/14/2016  . IR RADIOLOGIST EVAL & MGMT  11/24/2016  . IR RADIOLOGIST EVAL & MGMT  12/13/2016  . IR RADIOLOGY PERIPHERAL GUIDED IV START  01/18/2017  . IR US GUIDE VASC ACCESS LEFT  01/18/2017  . RADIOFREQUENCY ABLATION N/A 03/11/2016   Procedure: LIVER THERMAL ABLATION;  Surgeon: Aletta Edouard, MD;  Location: [facility];  Service: Anesthesiology;  Laterality: N/A;  . thyroid treatment     radioactive iodine treatment    Allergies: Patient has no known allergies.  Medications: Prior to Admission medications   Medication Sig Start Date End Date Taking? Authorizing Provider  albuterol (PROAIR HFA) 108 (90 BASE) MCG/ACT inhaler Inhale 2 puffs into the lungs every 6 (six) hours as needed for wheezing or shortness of breath.    [provider]  ALPRAZolam Duanne Moron) 0.5 MG tablet Take 0.5 mg by  mouth daily as needed for anxiety.     [provider]  Calcium Carbonate-Vitamin D (CALTRATE 600+D) 600-400 MG-UNIT per tablet Take 1 tablet by mouth daily.      [provider]  captopril (CAPOTEN) 25 MG tablet Take 25 mg by mouth 2 (two) times daily.      [provider]  furosemide (LASIX) 20 MG tablet Take 20 mg by mouth daily as needed for edema.  10/07/13   [provider]  glipiZIDE (GLUCOTROL XL) 5 MG 24 hr tablet Take 5 mg by  mouth daily.      [provider]  hydroxypropyl methylcellulose (ISOPTO TEARS) 2.5 % ophthalmic solution Place 1 drop into both eyes daily as needed (Dry Eyes).    [provider]  metFORMIN (GLUCOPHAGE-XR) 500 MG 24 hr tablet Take 1,000 mg by mouth 2 (two) times daily.     [provider]  Multiple Vitamins-Minerals (CENTRUM SILVER PO) Take 1 tablet by mouth daily.      [provider]  pantoprazole (PROTONIX) 40 MG tablet TAKE 1 TABLET BY MOUTH EVERY DAY 01/09/17   Rehman, Mechele Dawley, MD  propranolol (INDERAL) 20 MG tablet TAKE 1 TABLET BY MOUTH TWO TIMES DAILY 08/30/16   Rehman, Mechele Dawley, MD  simvastatin (ZOCOR) 40 MG tablet Take 40 mg by mouth daily.      [provider]  vitamin C (ASCORBIC ACID) 500 MG tablet Take 500 mg by mouth daily.    [provider]     Family History  Problem Relation Age of Onset  . Dementia Sister   . Dementia Sister   . Alzheimer's disease Sister   . Liver disease Brother   . Cancer Other        Colon Cancer, Prostate Cancer-Other Blood Relative  . Hyperlipidemia Other        Other Blood Relative  . Stroke Other        Parent, Other Blood Relative  . Diabetes Other        Parent, Other Blood Relative  . Hypertension Other        Parent  . Prostate cancer Maternal Aunt   . Dementia Maternal Aunt     Social History   Socioeconomic History  . Marital status: Single    Spouse name: Not on file  . Number of children: 0  . Years of education: 10  . Highest education level: Not on file  Social Needs  . Financial resource strain: Not on file  . Food insecurity - worry: Not on file  . Food insecurity - inability: Not on file  . Transportation needs - medical: Not on file  . Transportation needs - non-medical: Not on file  Occupational History  . Occupation: Textiles  Tobacco Use  . Smoking status: Never Smoker  . Smokeless tobacco: Never Used  Substance and Sexual Activity  . Alcohol use: No      Alcohol/week: 0.0 oz  . Drug use: No  . Sexual activity: No  Other Topics Concern  . Not on file  Social History Narrative   Regular Exercise-no     Vital Signs: Vitals:   03/15/17 1112  BP: (!) 168/59  Pulse: 72  Resp: 17  Temp: 97.8 F (36.6 C)  SpO2: 99%      Physical Exam awake, alert; chest clear to auscultation bilaterally.  Heart with normal rate, occasional ectopy noted.  Abdomen obese, soft, positive bowel sounds, nontender.  Trace lower extremity edema bilaterally.  Imaging: No results found.  Labs:  CBC: Recent Labs    04/19/16 0929  WBC 3.6*  HGB 13.5  HCT 39.7  PLT 68*    COAGS: No results for input(s): INR, APTT in the last 8760 hours.  BMP: Recent Labs    04/19/16 0929 06/14/16 1041 10/20/16 1532 12/12/16 0758 01/18/17 1214  NA 138  --   --   --   --   K 4.0  --   --   --   --   CL 105  --   --   --   --   CO2 27  --   --   --   --   GLUCOSE 242*  --   --   --   --   BUN 7  --   --   --   --   CALCIUM 8.8*  --   --   --   --   CREATININE 0.94 0.70 0.60 0.70 0.71  GFRNONAA 57*  --   --   --  >60  GFRAA >60  --   --   --  >60    LIVER FUNCTION TESTS: Recent Labs    04/19/16 0929 10/04/16 1320  BILITOT 1.3* 1.2  AST 32 28  ALT 19 17  ALKPHOS 73 82  PROT 7.2 6.6  ALBUMIN 3.3* 3.5*    Assessment: 77 y.o. female with a history of cirrhosis secondary to NASH and development of a hepatocellular carcinoma in segment 4A of the left lobe of the liver measuring 2.1 x 3.0 cm. This was treated by percutaneous microwave thermal ablation on 03/11/2016. Mrs. Hawbaker did well after the procedure with post procedural imaging demonstrating well-circumscribed ablation defect at the level of the treated tumor and initial AFP level showing slight decrease and stabilization.  Latest AFP on 02/09/17 was 137, up from 89.7  3 months ago.  MRI abdomen done on 01/18/17 revealed stable ablation defect in the anterior aspect of the central left hepatic  lobe.  There is enhancing tissue anterior to the ablation defect unchanged from prior but in light of rising AFP most likely represents residual tumor.  No new liver lesions noted.  There was evidence of portal hypertension, splenomegaly and mild venous collateralization.  Imaging studies were reviewed with patient and niece by Dr. Kathlene Cote.  At this time he recommends repeat ablation of the residual tumor/HCC and patient is agreeable to proceed.  We will contact her regarding possible dates for procedure.  In the meantime she will follow-up with Dr. Laural Golden regarding planned variceal banding with hopes to have this performed prior to hepatic ablation.   Signed: D. Rowe Robert, PA-C 03/15/2017, 11:11 AM   Please refer to Dr.Yamagata's attestation of this note for management and plan.      Patient ID: Darrin Luis, female   DOB: November 01, 1940, 77 y.o.   MRN: [id number]

## 2017-03-20 ENCOUNTER — Other Ambulatory Visit (HOSPITAL_COMMUNITY): Payer: Self-pay | Admitting: Interventional Radiology

## 2017-03-20 DIAGNOSIS — K769 Liver disease, unspecified: Secondary | ICD-10-CM

## 2017-03-27 DIAGNOSIS — K219 Gastro-esophageal reflux disease without esophagitis: Secondary | ICD-10-CM | POA: Diagnosis not present

## 2017-03-27 DIAGNOSIS — I10 Essential (primary) hypertension: Secondary | ICD-10-CM | POA: Diagnosis not present

## 2017-03-27 DIAGNOSIS — D696 Thrombocytopenia, unspecified: Secondary | ICD-10-CM | POA: Diagnosis not present

## 2017-03-27 DIAGNOSIS — E1165 Type 2 diabetes mellitus with hyperglycemia: Secondary | ICD-10-CM | POA: Diagnosis not present

## 2017-03-27 DIAGNOSIS — D649 Anemia, unspecified: Secondary | ICD-10-CM | POA: Diagnosis not present

## 2017-03-27 DIAGNOSIS — E039 Hypothyroidism, unspecified: Secondary | ICD-10-CM | POA: Diagnosis not present

## 2017-03-27 DIAGNOSIS — E782 Mixed hyperlipidemia: Secondary | ICD-10-CM | POA: Diagnosis not present

## 2017-03-31 DIAGNOSIS — I5030 Unspecified diastolic (congestive) heart failure: Secondary | ICD-10-CM | POA: Diagnosis not present

## 2017-03-31 DIAGNOSIS — E782 Mixed hyperlipidemia: Secondary | ICD-10-CM | POA: Diagnosis not present

## 2017-03-31 DIAGNOSIS — D696 Thrombocytopenia, unspecified: Secondary | ICD-10-CM | POA: Diagnosis not present

## 2017-03-31 DIAGNOSIS — E1165 Type 2 diabetes mellitus with hyperglycemia: Secondary | ICD-10-CM | POA: Diagnosis not present

## 2017-03-31 DIAGNOSIS — I10 Essential (primary) hypertension: Secondary | ICD-10-CM | POA: Diagnosis not present

## 2017-03-31 DIAGNOSIS — E039 Hypothyroidism, unspecified: Secondary | ICD-10-CM | POA: Diagnosis not present

## 2017-03-31 DIAGNOSIS — K7460 Unspecified cirrhosis of liver: Secondary | ICD-10-CM | POA: Diagnosis not present

## 2017-03-31 DIAGNOSIS — Z6841 Body Mass Index (BMI) 40.0 and over, adult: Secondary | ICD-10-CM | POA: Diagnosis not present

## 2017-04-11 ENCOUNTER — Ambulatory Visit (INDEPENDENT_AMBULATORY_CARE_PROVIDER_SITE_OTHER): Payer: Medicare Other | Admitting: Internal Medicine

## 2017-04-11 VITALS — BP 142/84 | Wt 231.2 lb

## 2017-04-11 DIAGNOSIS — C220 Liver cell carcinoma: Secondary | ICD-10-CM | POA: Diagnosis not present

## 2017-04-11 DIAGNOSIS — K7460 Unspecified cirrhosis of liver: Secondary | ICD-10-CM

## 2017-04-11 DIAGNOSIS — Z8719 Personal history of other diseases of the digestive system: Secondary | ICD-10-CM | POA: Diagnosis not present

## 2017-04-11 NOTE — Patient Instructions (Signed)
Esophagogastroduodenoscopy with possible esophageal variceal banding to be scheduled in April 2019.

## 2017-04-11 NOTE — Progress Notes (Signed)
Presenting complaint;  Follow-up for chronic liver disease. History of hepatocellular carcinoma.  Database and subjective:  Patient is 77 year old Caucasian female who was cirrhosis secondary to NASH complicated by esophageal varices which have been banded in the past.  Her disease also has been complicated by small [place] which was picked up on regular screening.  She underwent thermal thermal vision in January last year.  Following this intervention AFP started to come down but it has been gradually going back high.  She was seen by Dr. Aletta Edouard and underwent repeat MRCP in December 2018.  She is scheduled to be retreated on 04/26/2017. Dr. Aletta Edouard felt that she should undergo EGD prior to this procedure.  Patient has no complaints.  She remains with good appetite.  She denies abdominal pain nausea vomiting dysphagia or heartburn.  Bowels move regularly.  She denies melena or rectal bleeding.  No history of confusion or sleep disorder. Patient states she would like to delay EGD until after therapy of [place].   Current Medications: Outpatient Encounter Medications as of 04/11/2017  Medication Sig  . albuterol (PROAIR HFA) 108 (90 BASE) MCG/ACT inhaler Inhale 2 puffs into the lungs every 6 (six) hours as needed for wheezing or shortness of breath.  . ALPRAZolam (XANAX) 0.5 MG tablet Take 0.5 mg by mouth 2 (two) times daily as needed for anxiety.   . Calcium Carbonate-Vitamin D (CALTRATE 600+D) 600-400 MG-UNIT per tablet Take 1 tablet by mouth daily.    . captopril (CAPOTEN) 25 MG tablet Take 25 mg by mouth 2 (two) times daily.    . furosemide (LASIX) 20 MG tablet Take 20 mg by mouth daily as needed for edema.   Marland Kitchen glipiZIDE (GLUCOTROL XL) 5 MG 24 hr tablet Take 5 mg by mouth daily.    . hydroxypropyl methylcellulose (ISOPTO TEARS) 2.5 % ophthalmic solution Place 1 drop into both eyes 3 (three) times daily as needed for dry eyes (Dry Eyes).   . metFORMIN (GLUCOPHAGE-XR) 500 MG 24 hr tablet  Take 500-1,000 mg by mouth 2 (two) times daily.   . Multiple Vitamin (MULTIVITAMIN WITH MINERALS) TABS tablet Take 1 tablet by mouth daily. CENTRUM SILVER  . pantoprazole (PROTONIX) 40 MG tablet TAKE 1 TABLET BY MOUTH EVERY DAY (Patient taking differently: TAKE 1 TABLET BY MOUTH EVERY DAY IN THE EVENING)  . propranolol (INDERAL) 20 MG tablet TAKE 1 TABLET BY MOUTH TWO TIMES DAILY (Patient taking differently: TAKE 1 TABLET (20 MG) BY MOUTH TWO TIMES DAILY)  . simvastatin (ZOCOR) 40 MG tablet Take 40 mg by mouth at bedtime.   . sodium chloride (OCEAN) 0.65 % SOLN nasal spray Place 1 spray into both nostrils 4 (four) times daily as needed (FOR DRY NASAL PASSAGES.).   No facility-administered encounter medications on file as of 04/11/2017.     Objective: Blood pressure (!) 142/84, weight 231 lb 3.2 oz (104.9 kg). Patient is alert and in no acute distress. Asterixis absent. Conjunctiva is pink. Sclera is nonicteric Oropharyngeal mucosa is normal. No neck masses or thyromegaly noted. Cardiac exam with regular rhythm normal S1 and S2. No murmur or gallop noted. Lungs are clear to auscultation. Abdomen obese with prominent bulging area in mid abdomen.  She has 2 small scars in mid abdomen hidden in a crease and umbilicus is absent. No LE edema or clubbing noted.  Labs/studies Results:  MR from 03/21/2016 revealed stable ablation defect in anterior aspect of central left hepatic lobe segment IVa and enhancing tissue anterior to this  area suspicious for recurrent tumor.  Liver contour consistent with cirrhosis.  She has splenomegaly but no ascites.  AFP 137 on 01/30/2017 AFP 89.7 on December 12, 2016.   Assessment:  #1.  Cirrhosis secondary to NASH located by small [place] which was thermally ablated in January last year.  Now she appears to have small lesion next to site that was ablated.  It is concerning for residual or recurrent HCC given that her AFP levels are rising.  She is to undergo another  session of thermal therapy in about 2 weeks.  I agree with Dr. Margaretmary Dys plans.  View of this lesion is somewhat tricky because of its location.  #2.  3 of esophageal varices.  She has been banded in the past.  Last EGD was in November 2017 when she did not require banding.  Patient wants to delay EGD until after she has undergone [place] ablation.   Plan:  Patient will have AFP when she has preop blood work in about 2 weeks. Will schedule esophagogastroduodenoscopy in April 2019. Office visit in 6 months.

## 2017-04-12 ENCOUNTER — Encounter (INDEPENDENT_AMBULATORY_CARE_PROVIDER_SITE_OTHER): Payer: Self-pay | Admitting: Internal Medicine

## 2017-04-14 DIAGNOSIS — Z1231 Encounter for screening mammogram for malignant neoplasm of breast: Secondary | ICD-10-CM | POA: Diagnosis not present

## 2017-04-19 ENCOUNTER — Other Ambulatory Visit: Payer: Self-pay | Admitting: Radiology

## 2017-04-19 NOTE — Patient Instructions (Addendum)
Cheryl Velasquez  04/19/2017   Your procedure is scheduled on: 04-26-17   Report to [facility] Main  Entrance Report to Admitting at 9:45 AM    Call this number if you have problems the morning of surgery [phone]   Remember: Do not eat food or drink liquids :After Midnight.     Take these medicines the morning of surgery with A SIP OF WATER: Propranolol (Inderal) DO NOT TAKE ANY DIABETIC MEDICATIONS DAY OF YOUR SURGERY                               You may not have any metal on your body including hair pins and              piercings  Do not wear jewelry, make-up, lotions, powders or perfumes, deodorant             Do not wear nail polish.  Do not shave  48 hours prior to surgery.             Do not bring valuables to the hospital. [place].  Contacts, dentures or bridgework may not be worn into surgery.     Patients discharged the day of surgery will not be allowed to drive home.  Name and phone number of your driver:[phone] Delphina Cahill                Please read over the following fact sheets you were given: _____________________________________________________________________             [facility] - Preparing for Surgery Before surgery, you can play an important role.  Because skin is not sterile, your skin needs to be as free of germs as possible.  You can reduce the number of germs on your skin by washing with CHG (chlorahexidine gluconate) soap before surgery.  CHG is an antiseptic cleaner which kills germs and bonds with the skin to continue killing germs even after washing. Please DO NOT use if you have an allergy to CHG or antibacterial soaps.  If your skin becomes reddened/irritated stop using the CHG and inform your nurse when you arrive at Short Stay. Do not shave (including legs and underarms) for at least 48 hours prior to the first CHG shower.  You may shave your face/neck. Please  follow these instructions carefully:  1.  Shower with CHG Soap the night before surgery and the  morning of Surgery.  2.  If you choose to wash your hair, wash your hair first as usual with your  normal  shampoo.  3.  After you shampoo, rinse your hair and body thoroughly to remove the  shampoo.                           4.  Use CHG as you would any other liquid soap.  You can apply chg directly  to the skin and wash                       Gently with a scrungie or clean washcloth.  5.  Apply the CHG Soap to your body ONLY FROM THE NECK DOWN.   Do not use on  face/ open                           Wound or open sores. Avoid contact with eyes, ears mouth and genitals (private parts).                       Wash face,  Genitals (private parts) with your normal soap.             6.  Wash thoroughly, paying special attention to the area where your surgery  will be performed.  7.  Thoroughly rinse your body with warm water from the neck down.  8.  DO NOT shower/wash with your normal soap after using and rinsing off  the CHG Soap.                9.  Pat yourself dry with a clean towel.            10.  Wear clean pajamas.            11.  Place clean sheets on your bed the night of your first shower and do not  sleep with pets. Day of Surgery : Do not apply any lotions/deodorants the morning of surgery.  Please wear clean clothes to the hospital/surgery center.  FAILURE TO FOLLOW THESE INSTRUCTIONS MAY RESULT IN THE CANCELLATION OF YOUR SURGERY PATIENT SIGNATURE_________________________________  NURSE SIGNATURE__________________________________  ________________________________________________________________________   How to Manage Your Diabetes Before and After Surgery  Why is it important to control my blood sugar before and after surgery? . Improving blood sugar levels before and after surgery helps healing and can limit problems. . A way of improving blood sugar control is eating a healthy diet  by: o  Eating less sugar and carbohydrates o  Increasing activity/exercise o  Talking with your doctor about reaching your blood sugar goals . High blood sugars (greater than 180 mg/dL) can raise your risk of infections and slow your recovery, so you will need to focus on controlling your diabetes during the weeks before surgery. . Make sure that the doctor who takes care of your diabetes knows about your planned surgery including the date and location.  How do I manage my blood sugar before surgery? . Check your blood sugar at least 4 times a day, starting 2 days before surgery, to make sure that the level is not too high or low. o Check your blood sugar the morning of your surgery when you wake up and every 2 hours until you get to the Short Stay unit. . If your blood sugar is less than 70 mg/dL, you will need to treat for low blood sugar: o Do not take insulin. o Treat a low blood sugar (less than 70 mg/dL) with  cup of clear juice (cranberry or apple), 4 glucose tablets, OR glucose gel. o Recheck blood sugar in 15 minutes after treatment (to make sure it is greater than 70 mg/dL). If your blood sugar is not greater than 70 mg/dL on recheck, call [phone] for further instructions. . Report your blood sugar to the short stay nurse when you get to Short Stay.  . If you are admitted to the hospital after surgery: o Your blood sugar will be checked by the staff and you will probably be given insulin after surgery (instead of oral diabetes medicines) to make sure you have good blood sugar levels. o The goal for blood sugar control after  surgery is 80-180 mg/dL.   WHAT DO I DO ABOUT MY DIABETES MEDICATION?  Marland Kitchen Do not take oral diabetes medicines (pills) the morning of surgery.  . THE DAY BEFORE SURGERY, take only your usual dose of Glipizide and Metformin

## 2017-04-20 ENCOUNTER — Other Ambulatory Visit (HOSPITAL_COMMUNITY): Payer: Self-pay

## 2017-04-20 ENCOUNTER — Ambulatory Visit (HOSPITAL_COMMUNITY)
Admission: RE | Admit: 2017-04-20 | Discharge: 2017-04-20 | Disposition: A | Discharge status: Home / Self Care | Payer: Medicare Other | Source: Ambulatory Visit | Attending: Radiology | Admitting: Radiology

## 2017-04-20 ENCOUNTER — Encounter (HOSPITAL_COMMUNITY)
Admission: RE | Admit: 2017-04-20 | Discharge: 2017-04-20 | Disposition: A | Discharge status: Home / Self Care | Payer: Medicare Other | Source: Ambulatory Visit | Attending: Interventional Radiology | Admitting: Interventional Radiology

## 2017-04-20 ENCOUNTER — Other Ambulatory Visit: Payer: Self-pay

## 2017-04-20 ENCOUNTER — Encounter (HOSPITAL_COMMUNITY): Payer: Self-pay

## 2017-04-20 DIAGNOSIS — I10 Essential (primary) hypertension: Secondary | ICD-10-CM | POA: Insufficient documentation

## 2017-04-20 DIAGNOSIS — Z01818 Encounter for other preprocedural examination: Secondary | ICD-10-CM | POA: Insufficient documentation

## 2017-04-20 DIAGNOSIS — J90 Pleural effusion, not elsewhere classified: Secondary | ICD-10-CM | POA: Diagnosis not present

## 2017-04-20 DIAGNOSIS — Z01812 Encounter for preprocedural laboratory examination: Secondary | ICD-10-CM | POA: Diagnosis not present

## 2017-04-20 DIAGNOSIS — D500 Iron deficiency anemia secondary to blood loss (chronic): Secondary | ICD-10-CM

## 2017-04-20 DIAGNOSIS — Z01810 Encounter for preprocedural cardiovascular examination: Secondary | ICD-10-CM | POA: Diagnosis not present

## 2017-04-20 DIAGNOSIS — E119 Type 2 diabetes mellitus without complications: Secondary | ICD-10-CM | POA: Insufficient documentation

## 2017-04-20 DIAGNOSIS — C50411 Malignant neoplasm of upper-outer quadrant of right female breast: Secondary | ICD-10-CM

## 2017-04-20 DIAGNOSIS — E538 Deficiency of other specified B group vitamins: Secondary | ICD-10-CM

## 2017-04-20 LAB — CBC WITH DIFFERENTIAL/PLATELET
BASOS ABS: 0 10*3/uL (ref 0.0–0.1)
BASOS PCT: 1 %
Eosinophils Absolute: 0.1 10*3/uL (ref 0.0–0.7)
Eosinophils Relative: 4 %
HCT: 38.4 % (ref 36.0–46.0)
Hemoglobin: 12.6 g/dL (ref 12.0–15.0)
Lymphocytes Relative: 29 %
Lymphs Abs: 0.7 10*3/uL (ref 0.7–4.0)
MCH: 30.6 pg (ref 26.0–34.0)
MCHC: 32.8 g/dL (ref 30.0–36.0)
MCV: 93.2 fL (ref 78.0–100.0)
MONO ABS: 0.3 10*3/uL (ref 0.1–1.0)
Monocytes Relative: 10 %
NEUTROS ABS: 1.4 10*3/uL — AB (ref 1.7–7.7)
NEUTROS PCT: 56 %
Platelets: 54 10*3/uL — ABNORMAL LOW (ref 150–400)
RBC: 4.12 MIL/uL (ref 3.87–5.11)
RDW: 14.8 % (ref 11.5–15.5)
WBC: 2.5 10*3/uL — AB (ref 4.0–10.5)

## 2017-04-20 LAB — COMPREHENSIVE METABOLIC PANEL
ALBUMIN: 3.1 g/dL — AB (ref 3.5–5.0)
ALT: 26 U/L (ref 14–54)
AST: 43 U/L — AB (ref 15–41)
Alkaline Phosphatase: 85 U/L (ref 38–126)
Anion gap: 11 (ref 5–15)
BILIRUBIN TOTAL: 1 mg/dL (ref 0.3–1.2)
BUN: 8 mg/dL (ref 6–20)
CO2: 26 mmol/L (ref 22–32)
CREATININE: 0.75 mg/dL (ref 0.44–1.00)
Calcium: 8.7 mg/dL — ABNORMAL LOW (ref 8.9–10.3)
Chloride: 101 mmol/L (ref 101–111)
GFR calc Af Amer: >60 mL/min (ref >60)
GFR calc non Af Amer: >60 mL/min (ref >60)
GLUCOSE: 276 mg/dL — AB (ref 65–99)
POTASSIUM: 4 mmol/L (ref 3.5–5.1)
Sodium: 138 mmol/L (ref 135–145)
TOTAL PROTEIN: 6.8 g/dL (ref 6.5–8.1)

## 2017-04-20 LAB — APTT: aPTT: 32 seconds (ref 24–36)

## 2017-04-20 LAB — HEMOGLOBIN A1C
HEMOGLOBIN A1C: 9.1 % — AB (ref 4.8–5.6)
MEAN PLASMA GLUCOSE: 214.47 mg/dL

## 2017-04-20 LAB — GLUCOSE, CAPILLARY: GLUCOSE-CAPILLARY: 281 mg/dL — AB (ref 65–99)

## 2017-04-20 LAB — PROTIME-INR
INR: 1.24
Prothrombin Time: 15.5 seconds — ABNORMAL HIGH (ref 11.4–15.2)

## 2017-04-20 NOTE — Progress Notes (Addendum)
04-20-17 Anesthesia consult with Dr. Ola Spurr per order. CBC w/Diff, PTT, and HBA1C, CXR results routed to Dr. Kathlene Cote for review.

## 2017-04-21 ENCOUNTER — Encounter (HOSPITAL_COMMUNITY): Payer: Self-pay | Admitting: Hematology

## 2017-04-24 ENCOUNTER — Ambulatory Visit (HOSPITAL_COMMUNITY): Payer: Medicare Other | Admitting: Internal Medicine

## 2017-04-24 ENCOUNTER — Encounter (HOSPITAL_COMMUNITY): Payer: Self-pay | Admitting: Hematology

## 2017-04-24 ENCOUNTER — Inpatient Hospital Stay (HOSPITAL_COMMUNITY): Payer: Medicare Other | Attending: Internal Medicine

## 2017-04-24 ENCOUNTER — Inpatient Hospital Stay (HOSPITAL_BASED_OUTPATIENT_CLINIC_OR_DEPARTMENT_OTHER): Payer: Medicare Other | Admitting: Hematology

## 2017-04-24 ENCOUNTER — Other Ambulatory Visit: Payer: Self-pay | Admitting: Radiology

## 2017-04-24 ENCOUNTER — Ambulatory Visit (HOSPITAL_COMMUNITY): Payer: Medicare Other

## 2017-04-24 VITALS — BP 148/65 | HR 89 | Temp 97.9°F | Resp 20 | Wt 231.0 lb

## 2017-04-24 DIAGNOSIS — Z853 Personal history of malignant neoplasm of breast: Secondary | ICD-10-CM | POA: Insufficient documentation

## 2017-04-24 DIAGNOSIS — D759 Disease of blood and blood-forming organs, unspecified: Secondary | ICD-10-CM | POA: Insufficient documentation

## 2017-04-24 DIAGNOSIS — D72819 Decreased white blood cell count, unspecified: Secondary | ICD-10-CM | POA: Diagnosis not present

## 2017-04-24 DIAGNOSIS — E538 Deficiency of other specified B group vitamins: Secondary | ICD-10-CM

## 2017-04-24 DIAGNOSIS — D696 Thrombocytopenia, unspecified: Secondary | ICD-10-CM

## 2017-04-24 DIAGNOSIS — D731 Hypersplenism: Secondary | ICD-10-CM | POA: Insufficient documentation

## 2017-04-24 DIAGNOSIS — K769 Liver disease, unspecified: Secondary | ICD-10-CM

## 2017-04-24 DIAGNOSIS — K7460 Unspecified cirrhosis of liver: Secondary | ICD-10-CM

## 2017-04-24 DIAGNOSIS — D6959 Other secondary thrombocytopenia: Secondary | ICD-10-CM

## 2017-04-24 DIAGNOSIS — D500 Iron deficiency anemia secondary to blood loss (chronic): Secondary | ICD-10-CM

## 2017-04-24 DIAGNOSIS — C50411 Malignant neoplasm of upper-outer quadrant of right female breast: Secondary | ICD-10-CM

## 2017-04-24 DIAGNOSIS — Z08 Encounter for follow-up examination after completed treatment for malignant neoplasm: Secondary | ICD-10-CM

## 2017-04-24 LAB — COMPREHENSIVE METABOLIC PANEL
ALBUMIN: 3.1 g/dL — AB (ref 3.5–5.0)
ALK PHOS: 79 U/L (ref 38–126)
ALT: 23 U/L (ref 14–54)
AST: 40 U/L (ref 15–41)
Anion gap: 11 (ref 5–15)
BILIRUBIN TOTAL: 1.3 mg/dL — AB (ref 0.3–1.2)
BUN: 10 mg/dL (ref 6–20)
CALCIUM: 9.1 mg/dL (ref 8.9–10.3)
CO2: 25 mmol/L (ref 22–32)
Chloride: 102 mmol/L (ref 101–111)
Creatinine, Ser: 0.75 mg/dL (ref 0.44–1.00)
GFR calc Af Amer: >60 mL/min (ref >60)
GFR calc non Af Amer: >60 mL/min (ref >60)
GLUCOSE: 281 mg/dL — AB (ref 65–99)
POTASSIUM: 4.1 mmol/L (ref 3.5–5.1)
SODIUM: 138 mmol/L (ref 135–145)
Total Protein: 7 g/dL (ref 6.5–8.1)

## 2017-04-24 LAB — IRON AND TIBC
IRON: 80 ug/dL (ref 28–170)
Saturation Ratios: 20 % (ref 10.4–31.8)
TIBC: 391 ug/dL (ref 250–450)
UIBC: 311 ug/dL

## 2017-04-24 LAB — CBC WITH DIFFERENTIAL/PLATELET
BASOS ABS: 0 10*3/uL (ref 0.0–0.1)
Basophils Relative: 1 %
EOS ABS: 0.1 10*3/uL (ref 0.0–0.7)
Eosinophils Relative: 3 %
HEMATOCRIT: 38.9 % (ref 36.0–46.0)
HEMOGLOBIN: 12.4 g/dL (ref 12.0–15.0)
Lymphocytes Relative: 30 %
Lymphs Abs: 0.8 10*3/uL (ref 0.7–4.0)
MCH: 29.5 pg (ref 26.0–34.0)
MCHC: 31.9 g/dL (ref 30.0–36.0)
MCV: 92.6 fL (ref 78.0–100.0)
MONOS PCT: 8 %
Monocytes Absolute: 0.2 10*3/uL (ref 0.1–1.0)
NEUTROS ABS: 1.6 10*3/uL — AB (ref 1.7–7.7)
NEUTROS PCT: 58 %
Platelets: 58 10*3/uL — ABNORMAL LOW (ref 150–400)
RBC: 4.2 MIL/uL (ref 3.87–5.11)
RDW: 14.7 % (ref 11.5–15.5)
WBC: 2.8 10*3/uL — AB (ref 4.0–10.5)

## 2017-04-24 LAB — VITAMIN B12: Vitamin B-12: 407 pg/mL (ref 180–914)

## 2017-04-24 LAB — FERRITIN: Ferritin: 27 ng/mL (ref 11–307)

## 2017-04-24 NOTE — Patient Instructions (Signed)
[facility]  Discharge Instructions:  Return in 1 year with lab work. Your labs and mammogram look good.  _______________________________________________________________  Thank you for choosing [place] [facility] to provide your oncology and hematology care.  To afford each patient quality time with our providers, please arrive at least 15 minutes before your scheduled appointment.  You need to re-schedule your appointment if you arrive 10 or more minutes late.  We strive to give you quality time with our providers, and arriving late affects you and other patients whose appointments are after yours.  Also, if you no show three or more times for appointments you may be dismissed from the clinic.  Again, thank you for choosing [place] [facility] hope is that these requests will allow you access to exceptional care and in a timely manner. _______________________________________________________________  If you have questions after your visit, please contact our office at [phone] between the hours of 8:30 a.m. and 5:00 p.m. Voicemails left after 4:30 p.m. will not be returned until the following business day. _______________________________________________________________  For prescription refill requests, have your pharmacy contact our office. _______________________________________________________________  Recommendations made by the consultant and any test results will be sent to your referring physician. _______________________________________________________________

## 2017-04-24 NOTE — Progress Notes (Signed)
Patient Care Team: [name], MD as PCP - General (Unknown Physician Specialty)  DIAGNOSIS:  Encounter Diagnoses  Name Primary?  . Encounter for follow-up surveillance of breast cancer Yes  . Thrombocytopenia due to hypersplenism   . Leukopenia, unspecified type     SUMMARY OF ONCOLOGIC HISTORY:   Breast cancer of upper-outer quadrant of right female breast ([place])   12/12/2003 Initial Biopsy    T1b tumor, ER+ greater than 90%, PR + 20%, HER 2/neu negative.      12/19/2003 Surgery    Lumpectomy, sentinel node. No metastatic carcinoma in 4 LN. No residual cancer in the biopsy specimen      01/06/2004 - 01/06/2009 Anti-estrogen oral therapy    Arimidex X 5 years      01/19/2004 - 03/22/2004 Radiation Therapy    5040 cGY in 28 fractions delivered with boost to tumor bed totaling 6040 cGY       CHIEF COMPLIANT:   INTERVAL HISTORY: Cheryl Velasquez is a 77 year old female here for follow-up of right breast cancer.  She denies any new onset pains.  She had mammogram done at [facility] on the first of this month.  She is reportedly scheduled for microwave ablation of hepatocellular carcinoma on Wednesday.  REVIEW OF SYSTEMS:   Constitutional: Denies fevers, chills or abnormal weight loss Eyes: Denies blurriness of vision Ears, nose, mouth, throat, and face: Denies mucositis or sore throat Respiratory: Denies cough, dyspnea or wheezes Cardiovascular: Denies palpitation, chest discomfort Gastrointestinal:  Denies nausea, heartburn or change in bowel habits Skin: Denies abnormal skin rashes Lymphatics: Denies new lymphadenopathy or easy bruising Neurological:Denies numbness, tingling or new weaknesses Behavioral/Psych: Mood is stable, no new changes  Extremities: No lower extremity edema Breast: She denies any pain or lumps or nodules in either breasts All other systems were reviewed with the patient and are negative.  I have reviewed the past medical history, past  surgical history, social history and family history with the patient and they are unchanged from previous note.  ALLERGIES:  has No Known Allergies.  MEDICATIONS:  Current Outpatient Medications  Medication Sig Dispense Refill  . albuterol (PROAIR HFA) 108 (90 BASE) MCG/ACT inhaler Inhale 2 puffs into the lungs every 6 (six) hours as needed for wheezing or shortness of breath.    . ALPRAZolam (XANAX) 0.5 MG tablet Take 0.5 mg by mouth 2 (two) times daily as needed for anxiety.     . Calcium Carbonate-Vitamin D (CALTRATE 600+D) 600-400 MG-UNIT per tablet Take 1 tablet by mouth daily.      . captopril (CAPOTEN) 25 MG tablet Take 25 mg by mouth 2 (two) times daily.      . furosemide (LASIX) 20 MG tablet Take 20 mg by mouth daily as needed for edema.     Marland Kitchen glipiZIDE (GLUCOTROL XL) 5 MG 24 hr tablet Take 5 mg by mouth daily.      . hydroxypropyl methylcellulose (ISOPTO TEARS) 2.5 % ophthalmic solution Place 1 drop into both eyes 3 (three) times daily as needed for dry eyes (Dry Eyes).     . metFORMIN (GLUCOPHAGE-XR) 500 MG 24 hr tablet Take 500-1,000 mg by mouth 2 (two) times daily.     . Multiple Vitamin (MULTIVITAMIN WITH MINERALS) TABS tablet Take 1 tablet by mouth daily. CENTRUM SILVER    . pantoprazole (PROTONIX) 40 MG tablet TAKE 1 TABLET BY MOUTH EVERY DAY (Patient taking differently: TAKE 1 TABLET BY MOUTH EVERY DAY IN THE EVENING) 90 tablet 4  .  propranolol (INDERAL) 20 MG tablet TAKE 1 TABLET BY MOUTH TWO TIMES DAILY (Patient taking differently: TAKE 1 TABLET (20 MG) BY MOUTH TWO TIMES DAILY) 60 tablet 5  . simvastatin (ZOCOR) 40 MG tablet Take 40 mg by mouth at bedtime.     . sodium chloride (OCEAN) 0.65 % SOLN nasal spray Place 1 spray into both nostrils 4 (four) times daily as needed (FOR DRY NASAL PASSAGES.).     No current facility-administered medications for this visit.     PHYSICAL EXAMINATION: ECOG PERFORMANCE STATUS: 1 - Symptomatic but completely ambulatory  Vitals:    04/24/17 1047  BP: (!) 148/65  Pulse: 89  Resp: 20  Temp: 97.9 F (36.6 C)   Filed Weights   04/24/17 1047  Weight: 231 lb (104.8 kg)    GENERAL:alert, no distress and comfortable SKIN: skin color, texture, turgor are normal, no rashes or significant lesions EYES: normal, Conjunctiva are pink and non-injected, sclera clear OROPHARYNX:no mucositis, no erythema and lips, buccal mucosa, and tongue normal  NECK: supple, thyroid normal size, non-tender, without nodularity LYMPH:  no palpable lymphadenopathy in the cervical, axillary or inguinal LUNGS: clear to auscultation and percussion with normal breathing effort HEART: regular rate & rhythm and no murmurs and no lower extremity edema ABDOMEN:abdomen soft, non-tender and normal bowel sounds MUSCULOSKELETAL:no cyanosis of digits and no clubbing   EXTREMITIES: No lower extremity edema BREAST: Right breast lumpectomy scar in the upper outer quadrant is within normal limits.  No palpable masses or nodules in either right or left breasts. No palpable axillary supraclavicular or infraclavicular adenopathy no breast tenderness or nipple discharge. (exam performed in the presence of a chaperone)  LABORATORY DATA:  I have reviewed the data as listed CMP Latest Ref Rng & Units 04/24/2017 04/20/2017 01/18/2017  Glucose 65 - 99 mg/dL 281(H) 276(H) -  BUN 6 - 20 mg/dL 10 8 -  Creatinine 0.44 - 1.00 mg/dL 0.75 0.75 0.71  Sodium 135 - 145 mmol/L 138 138 -  Potassium 3.5 - 5.1 mmol/L 4.1 4.0 -  Chloride 101 - 111 mmol/L 102 101 -  CO2 22 - 32 mmol/L 25 26 -  Calcium 8.9 - 10.3 mg/dL 9.1 8.7(L) -  Total Protein 6.5 - 8.1 g/dL 7.0 6.8 -  Total Bilirubin 0.3 - 1.2 mg/dL 1.3(H) 1.0 -  Alkaline Phos 38 - 126 U/L 79 85 -  AST 15 - 41 U/L 40 43(H) -  ALT 14 - 54 U/L 23 26 -   No results found for: IOE703   Lab Results  Component Value Date   WBC 2.8 (L) 04/24/2017   HGB 12.4 04/24/2017   HCT 38.9 04/24/2017   MCV 92.6 04/24/2017   PLT 58 (L)  04/24/2017   NEUTROABS 1.6 (L) 04/24/2017    ASSESSMENT & PLAN:  1. Right breast cancer: Today her physical examination was within normal limits.  No masses palpable in both breasts.  I have reviewed mammogram report dated 04/14/2017 done at [facility], reported as BI-RADS Category 1.  She will come back in 1 year with repeat mammogram.  2.  Leukopenia and thrombocytopenia: Her platelet count is stable in the 50s.  White count has also been stable in between 2.3.  Absolute neutrophil count is above 1500.  She has cytopenias from hypersplenism from her cirrhosis.  She is reportedly scheduled for a microwave ablation of a liver lesion on 04/26/2017.  The patient has a good understanding of the overall plan. she agrees with it. she  will call with any problems that may develop before the next visit here.   Derek Jack, MD 04/24/17

## 2017-04-26 ENCOUNTER — Ambulatory Visit (HOSPITAL_COMMUNITY)
Admission: RE | Admit: 2017-04-26 | Discharge: 2017-04-26 | Disposition: A | Discharge status: Home / Self Care | Payer: Medicare Other | Source: Ambulatory Visit | Attending: Interventional Radiology | Admitting: Interventional Radiology

## 2017-04-26 ENCOUNTER — Other Ambulatory Visit: Payer: Self-pay

## 2017-04-26 ENCOUNTER — Encounter (HOSPITAL_COMMUNITY): Payer: Self-pay

## 2017-04-26 ENCOUNTER — Encounter (HOSPITAL_COMMUNITY): Payer: Self-pay | Admitting: Certified Registered Nurse Anesthetist

## 2017-04-26 ENCOUNTER — Ambulatory Visit (HOSPITAL_COMMUNITY): Payer: Medicare Other | Admitting: Anesthesiology

## 2017-04-26 ENCOUNTER — Encounter (HOSPITAL_COMMUNITY)
Admission: RE | Disposition: A | Discharge status: Home / Self Care | Payer: Self-pay | Source: Ambulatory Visit | Attending: Interventional Radiology

## 2017-04-26 ENCOUNTER — Observation Stay (HOSPITAL_COMMUNITY)
Admission: RE | Admit: 2017-04-26 | Discharge: 2017-04-27 | Disposition: A | Discharge status: Home / Self Care | Payer: Medicare Other | Source: Ambulatory Visit | Attending: Interventional Radiology | Admitting: Interventional Radiology

## 2017-04-26 DIAGNOSIS — Z923 Personal history of irradiation: Secondary | ICD-10-CM | POA: Diagnosis not present

## 2017-04-26 DIAGNOSIS — R161 Splenomegaly, not elsewhere classified: Secondary | ICD-10-CM | POA: Diagnosis not present

## 2017-04-26 DIAGNOSIS — Z7984 Long term (current) use of oral hypoglycemic drugs: Secondary | ICD-10-CM | POA: Insufficient documentation

## 2017-04-26 DIAGNOSIS — E785 Hyperlipidemia, unspecified: Secondary | ICD-10-CM | POA: Diagnosis not present

## 2017-04-26 DIAGNOSIS — Z853 Personal history of malignant neoplasm of breast: Secondary | ICD-10-CM | POA: Diagnosis not present

## 2017-04-26 DIAGNOSIS — C220 Liver cell carcinoma: Principal | ICD-10-CM | POA: Diagnosis present

## 2017-04-26 DIAGNOSIS — Z79899 Other long term (current) drug therapy: Secondary | ICD-10-CM | POA: Diagnosis not present

## 2017-04-26 DIAGNOSIS — Z6841 Body Mass Index (BMI) 40.0 and over, adult: Secondary | ICD-10-CM | POA: Diagnosis not present

## 2017-04-26 DIAGNOSIS — D696 Thrombocytopenia, unspecified: Secondary | ICD-10-CM | POA: Diagnosis not present

## 2017-04-26 DIAGNOSIS — E119 Type 2 diabetes mellitus without complications: Secondary | ICD-10-CM | POA: Insufficient documentation

## 2017-04-26 DIAGNOSIS — I10 Essential (primary) hypertension: Secondary | ICD-10-CM | POA: Insufficient documentation

## 2017-04-26 DIAGNOSIS — K769 Liver disease, unspecified: Secondary | ICD-10-CM

## 2017-04-26 DIAGNOSIS — K219 Gastro-esophageal reflux disease without esophagitis: Secondary | ICD-10-CM | POA: Diagnosis not present

## 2017-04-26 DIAGNOSIS — D509 Iron deficiency anemia, unspecified: Secondary | ICD-10-CM | POA: Diagnosis not present

## 2017-04-26 HISTORY — PX: RADIOLOGY WITH ANESTHESIA: SHX6223

## 2017-04-26 LAB — GLUCOSE, CAPILLARY
GLUCOSE-CAPILLARY: 237 mg/dL — AB (ref 65–99)
GLUCOSE-CAPILLARY: 206 mg/dL — AB (ref 65–99)
Glucose-Capillary: 176 mg/dL — ABNORMAL HIGH (ref 65–99)

## 2017-04-26 LAB — COMPREHENSIVE METABOLIC PANEL
ALBUMIN: 3.1 g/dL — AB (ref 3.5–5.0)
ALT: 25 U/L (ref 14–54)
AST: 44 U/L — AB (ref 15–41)
Alkaline Phosphatase: 77 U/L (ref 38–126)
Anion gap: 11 (ref 5–15)
BUN: 12 mg/dL (ref 6–20)
CO2: 24 mmol/L (ref 22–32)
CREATININE: 0.69 mg/dL (ref 0.44–1.00)
Calcium: 8.9 mg/dL (ref 8.9–10.3)
Chloride: 106 mmol/L (ref 101–111)
GFR calc Af Amer: >60 mL/min (ref >60)
GFR calc non Af Amer: >60 mL/min (ref >60)
Glucose, Bld: 242 mg/dL — ABNORMAL HIGH (ref 65–99)
POTASSIUM: 3.7 mmol/L (ref 3.5–5.1)
SODIUM: 141 mmol/L (ref 135–145)
Total Bilirubin: 1.6 mg/dL — ABNORMAL HIGH (ref 0.3–1.2)
Total Protein: 6.9 g/dL (ref 6.5–8.1)

## 2017-04-26 LAB — CBC WITH DIFFERENTIAL/PLATELET
BASOS ABS: 0 10*3/uL (ref 0.0–0.1)
Basophils Relative: 1 %
EOS ABS: 0.1 10*3/uL (ref 0.0–0.7)
Eosinophils Relative: 2 %
HCT: 40.7 % (ref 36.0–46.0)
Hemoglobin: 13.8 g/dL (ref 12.0–15.0)
LYMPHS ABS: 0.9 10*3/uL (ref 0.7–4.0)
Lymphocytes Relative: 25 %
MCH: 31 pg (ref 26.0–34.0)
MCHC: 33.9 g/dL (ref 30.0–36.0)
MCV: 91.5 fL (ref 78.0–100.0)
MONO ABS: 0.3 10*3/uL (ref 0.1–1.0)
Monocytes Relative: 7 %
NEUTROS PCT: 65 %
Neutro Abs: 2.4 10*3/uL (ref 1.7–7.7)
PLATELETS: 61 10*3/uL — AB (ref 150–400)
RBC: 4.45 MIL/uL (ref 3.87–5.11)
RDW: 14.7 % (ref 11.5–15.5)
WBC: 3.7 10*3/uL — AB (ref 4.0–10.5)

## 2017-04-26 LAB — TYPE AND SCREEN
ABO/RH(D): B POS
Antibody Screen: NEGATIVE

## 2017-04-26 SURGERY — CT WITH ANESTHESIA
Anesthesia: General

## 2017-04-26 MED ORDER — LACTATED RINGERS IV SOLN
INTRAVENOUS | Status: DC
  Administered 2017-04-26: 10:00:00 via INTRAVENOUS

## 2017-04-26 MED ORDER — ALBUTEROL SULFATE HFA 108 (90 BASE) MCG/ACT IN AERS: 2.0000 | INHALATION_SPRAY | Freq: Four times a day (QID) | RESPIRATORY_TRACT | Status: DC | PRN

## 2017-04-26 MED ORDER — IOPAMIDOL (ISOVUE-370) INJECTION 76%
100.0000 mL | Freq: Once | INTRAVENOUS | Status: AC | PRN
  Administered 2017-04-26: 75 mL via INTRAVENOUS

## 2017-04-26 MED ORDER — PIPERACILLIN-TAZOBACTAM 3.375 G IVPB
INTRAVENOUS | Status: AC
  Filled 2017-04-26: qty 50

## 2017-04-26 MED ORDER — FENTANYL CITRATE (PF) 100 MCG/2ML IJ SOLN
25.0000 ug | INTRAMUSCULAR | Status: DC | PRN
  Administered 2017-04-26: 50 ug via INTRAVENOUS

## 2017-04-26 MED ORDER — PANTOPRAZOLE SODIUM 40 MG PO TBEC
40.0000 mg | DELAYED_RELEASE_TABLET | Freq: Every evening | ORAL | Status: DC
  Administered 2017-04-26: 40 mg via ORAL
  Filled 2017-04-26: qty 1

## 2017-04-26 MED ORDER — DEXAMETHASONE SODIUM PHOSPHATE 10 MG/ML IJ SOLN
INTRAMUSCULAR | Status: DC | PRN
  Administered 2017-04-26: 10 mg via INTRAVENOUS

## 2017-04-26 MED ORDER — ONDANSETRON HCL 4 MG/2ML IJ SOLN: 4.0000 mg | Freq: Once | INTRAMUSCULAR | Status: DC | PRN

## 2017-04-26 MED ORDER — FENTANYL CITRATE (PF) 250 MCG/5ML IJ SOLN
INTRAMUSCULAR | Status: AC
  Filled 2017-04-26: qty 5

## 2017-04-26 MED ORDER — ALPRAZOLAM 0.5 MG PO TABS: 0.5000 mg | ORAL_TABLET | Freq: Two times a day (BID) | ORAL | Status: DC | PRN

## 2017-04-26 MED ORDER — FENTANYL CITRATE (PF) 100 MCG/2ML IJ SOLN
INTRAMUSCULAR | Status: AC
  Administered 2017-04-26: 50 ug via INTRAVENOUS
  Filled 2017-04-26: qty 2

## 2017-04-26 MED ORDER — SODIUM CHLORIDE 0.9 % IJ SOLN
INTRAMUSCULAR | Status: AC
  Filled 2017-04-26: qty 50

## 2017-04-26 MED ORDER — FENTANYL CITRATE (PF) 100 MCG/2ML IJ SOLN
25.0000 ug | INTRAMUSCULAR | Status: DC | PRN
  Administered 2017-04-26 (×3): 50 ug via INTRAVENOUS

## 2017-04-26 MED ORDER — ONDANSETRON HCL 4 MG/2ML IJ SOLN
INTRAMUSCULAR | Status: DC | PRN
  Administered 2017-04-26: 4 mg via INTRAVENOUS

## 2017-04-26 MED ORDER — SENNOSIDES-DOCUSATE SODIUM 8.6-50 MG PO TABS: 1.0000 | ORAL_TABLET | Freq: Every day | ORAL | Status: DC | PRN

## 2017-04-26 MED ORDER — ALBUTEROL SULFATE (2.5 MG/3ML) 0.083% IN NEBU
2.5000 mg | INHALATION_SOLUTION | RESPIRATORY_TRACT | Status: AC
  Administered 2017-04-26: 2.5 mg via RESPIRATORY_TRACT

## 2017-04-26 MED ORDER — EPHEDRINE SULFATE-NACL 50-0.9 MG/10ML-% IV SOSY
PREFILLED_SYRINGE | INTRAVENOUS | Status: DC | PRN
  Administered 2017-04-26: 10 mg via INTRAVENOUS

## 2017-04-26 MED ORDER — SODIUM CHLORIDE 0.9 % IV SOLN
INTRAVENOUS | Status: DC
  Administered 2017-04-26: 17:00:00 via INTRAVENOUS

## 2017-04-26 MED ORDER — FUROSEMIDE 20 MG PO TABS: 20.0000 mg | ORAL_TABLET | Freq: Every day | ORAL | Status: DC | PRN

## 2017-04-26 MED ORDER — PROPOFOL 10 MG/ML IV BOLUS
INTRAVENOUS | Status: DC | PRN
  Administered 2017-04-26: 20 mg via INTRAVENOUS
  Administered 2017-04-26: 120 mg via INTRAVENOUS

## 2017-04-26 MED ORDER — PIPERACILLIN-TAZOBACTAM 3.375 G IVPB
3.3750 g | Freq: Once | INTRAVENOUS | Status: AC
  Administered 2017-04-26: 3.375 g via INTRAVENOUS

## 2017-04-26 MED ORDER — INSULIN ASPART 100 UNIT/ML ~~LOC~~ SOLN
0.0000 [IU] | Freq: Three times a day (TID) | SUBCUTANEOUS | Status: DC
  Administered 2017-04-26: 5 [IU] via SUBCUTANEOUS
  Administered 2017-04-27: 8 [IU] via SUBCUTANEOUS

## 2017-04-26 MED ORDER — ALBUTEROL SULFATE (2.5 MG/3ML) 0.083% IN NEBU
INHALATION_SOLUTION | RESPIRATORY_TRACT | Status: AC
  Administered 2017-04-26: 2.5 mg via RESPIRATORY_TRACT
  Filled 2017-04-26: qty 3

## 2017-04-26 MED ORDER — GLIPIZIDE ER 5 MG PO TB24
5.0000 mg | ORAL_TABLET | Freq: Every day | ORAL | Status: DC
  Administered 2017-04-27: 5 mg via ORAL
  Filled 2017-04-26: qty 1

## 2017-04-26 MED ORDER — ONDANSETRON HCL 4 MG/2ML IJ SOLN: 4.0000 mg | Freq: Four times a day (QID) | INTRAMUSCULAR | Status: DC | PRN

## 2017-04-26 MED ORDER — SUGAMMADEX SODIUM 200 MG/2ML IV SOLN
INTRAVENOUS | Status: DC | PRN
  Administered 2017-04-26: 200 mg via INTRAVENOUS

## 2017-04-26 MED ORDER — LIDOCAINE 2% (20 MG/ML) 5 ML SYRINGE
INTRAMUSCULAR | Status: DC | PRN
  Administered 2017-04-26: 60 mg via INTRAVENOUS

## 2017-04-26 MED ORDER — PROPRANOLOL HCL 20 MG PO TABS
20.0000 mg | ORAL_TABLET | Freq: Two times a day (BID) | ORAL | Status: DC
  Administered 2017-04-26 – 2017-04-27 (×2): 20 mg via ORAL
  Filled 2017-04-26 (×2): qty 1

## 2017-04-26 MED ORDER — PHENYLEPHRINE 40 MCG/ML (10ML) SYRINGE FOR IV PUSH (FOR BLOOD PRESSURE SUPPORT)
PREFILLED_SYRINGE | INTRAVENOUS | Status: DC | PRN
  Administered 2017-04-26: 120 ug via INTRAVENOUS
  Administered 2017-04-26 (×2): 80 ug via INTRAVENOUS
  Administered 2017-04-26: 120 ug via INTRAVENOUS
  Administered 2017-04-26: 80 ug via INTRAVENOUS
  Administered 2017-04-26: 120 ug via INTRAVENOUS
  Administered 2017-04-26: 80 ug via INTRAVENOUS

## 2017-04-26 MED ORDER — HYDROCODONE-ACETAMINOPHEN 5-325 MG PO TABS
1.0000 | ORAL_TABLET | ORAL | Status: DC | PRN
  Administered 2017-04-26: 1 via ORAL
  Filled 2017-04-26: qty 1

## 2017-04-26 MED ORDER — DOCUSATE SODIUM 100 MG PO CAPS
100.0000 mg | ORAL_CAPSULE | Freq: Two times a day (BID) | ORAL | Status: DC
  Administered 2017-04-26 – 2017-04-27 (×2): 100 mg via ORAL
  Filled 2017-04-26 (×2): qty 1

## 2017-04-26 MED ORDER — SODIUM CHLORIDE 0.9 % IV SOLN: Freq: Once | INTRAVENOUS | Status: DC

## 2017-04-26 MED ORDER — IOPAMIDOL (ISOVUE-370) INJECTION 76%
INTRAVENOUS | Status: AC
  Administered 2017-04-26: 75 mL via INTRAVENOUS
  Filled 2017-04-26: qty 100

## 2017-04-26 MED ORDER — CAPTOPRIL 25 MG PO TABS
25.0000 mg | ORAL_TABLET | Freq: Two times a day (BID) | ORAL | Status: DC
  Administered 2017-04-26 – 2017-04-27 (×2): 25 mg via ORAL
  Filled 2017-04-26 (×2): qty 1

## 2017-04-26 MED ORDER — ALBUTEROL SULFATE (2.5 MG/3ML) 0.083% IN NEBU
2.5000 mg | INHALATION_SOLUTION | Freq: Four times a day (QID) | RESPIRATORY_TRACT | Status: DC | PRN
  Administered 2017-04-26 – 2017-04-27 (×2): 2.5 mg via RESPIRATORY_TRACT
  Filled 2017-04-26 (×2): qty 3

## 2017-04-26 MED ORDER — FENTANYL CITRATE (PF) 100 MCG/2ML IJ SOLN
INTRAMUSCULAR | Status: DC | PRN
  Administered 2017-04-26: 100 ug via INTRAVENOUS
  Administered 2017-04-26: 50 ug via INTRAVENOUS

## 2017-04-26 MED ORDER — ROCURONIUM BROMIDE 10 MG/ML (PF) SYRINGE
PREFILLED_SYRINGE | INTRAVENOUS | Status: DC | PRN
  Administered 2017-04-26: 20 mg via INTRAVENOUS
  Administered 2017-04-26: 40 mg via INTRAVENOUS
  Administered 2017-04-26 (×2): 20 mg via INTRAVENOUS

## 2017-04-26 MED ORDER — SIMVASTATIN 40 MG PO TABS
40.0000 mg | ORAL_TABLET | Freq: Every day | ORAL | Status: DC
Start: 2017-04-26 — End: 2017-04-27
  Administered 2017-04-26: 40 mg via ORAL
  Filled 2017-04-26: qty 1

## 2017-04-26 MED ORDER — HYDROMORPHONE HCL 1 MG/ML IJ SOLN: 0.5000 mg | INTRAMUSCULAR | Status: DC | PRN

## 2017-04-26 NOTE — H&P (Signed)
Referring Physician(s): Rehman,N  Supervising Physician: Aletta Edouard  Patient Status:  [facility] TBA  Chief Complaint: Hepatocellular carcinoma   Subjective: Patient familiar to IR service from prior random liver biopsy in 2012 noting cirrhosis as well as CT-guided thermal ablation of segment 4 hepatocellular carcinoma on 03/11/16.  She also has a remote history of right breast cancer with lumpectomy and antiestrogen therapy along with radiation therapy in 2005.  She has NASH cirrhosis with esophageal varices, splenomegaly/portal hypertension, leukopenia and thrombocytopenia.  Latest AFP is 137 and rising.  She was seen in IR clinic by Dr. Kathlene Cote on 03/15/17 and discussions were held regarding imaging finding of enhancement inferior to the prior ablation site concerning for residual hepatocellular carcinoma.  Decision was made to proceed with repeat CT-guided thermal ablation of the residual HCC and she presents today for the procedure.  She currently denies fever, headache, chest pain, cough, abdominal/back pain, nausea, vomiting, blood in stool, urine or hemoptysis.  She does have some dyspnea with exertion. Past Medical History:  Diagnosis Date  . Arthritis   . Cirrhosis ([place])   . Diabetes mellitus   . Dyspnea    shortness of breath with any exertion  . History of breast cancer   . History of chicken pox   . Hyperlipidemia   . Hypertension   . Thyroid disease    Past Surgical History:  Procedure Laterality Date  . ABDOMINAL HYSTERECTOMY  1987  . BREAST BIOPSY  2005  . ESOPHAGEAL BANDING N/A 12/13/2012   Procedure: ESOPHAGEAL BANDING;  Surgeon: Rogene Houston, MD;  [facility]: [facility];  Service: Endoscopy;  Laterality: N/A;  . ESOPHAGOGASTRODUODENOSCOPY N/A 09/20/2012   Procedure: ESOPHAGOGASTRODUODENOSCOPY (EGD);  Surgeon: Rogene Houston, MD;  [facility]: [facility];  Service: Endoscopy;  Laterality: N/A;  125-moved to 12:00 Ann notified pt  .  ESOPHAGOGASTRODUODENOSCOPY N/A 12/13/2012   Procedure: ESOPHAGOGASTRODUODENOSCOPY (EGD);  Surgeon: Rogene Houston, MD;  [facility]: [facility];  Service: Endoscopy;  Laterality: N/A;  155-moved to 220 Ann to notify pt  . ESOPHAGOGASTRODUODENOSCOPY N/A 01/11/2016   Procedure: ESOPHAGOGASTRODUODENOSCOPY (EGD);  Surgeon: Rogene Houston, MD;  Location: [facility];  Service: Endoscopy;  Laterality: N/A;  7:30  . EYE SURGERY     bilateral cataract surgery with lens implants  . HERNIA REPAIR  2004  . IR GENERIC HISTORICAL  01/19/2016   IR RADIOLOGIST EVAL & MGMT 01/19/2016 Aletta Edouard, MD [facility] INTERV RAD  . IR GENERIC HISTORICAL  03/29/2016   IR RADIOLOGIST EVAL & MGMT 03/29/2016 Aletta Edouard, MD [facility] INTERV RAD  . IR RADIOLOGIST EVAL & MGMT  06/14/2016  . IR RADIOLOGIST EVAL & MGMT  11/24/2016  . IR RADIOLOGIST EVAL & MGMT  12/13/2016  . IR RADIOLOGIST EVAL & MGMT  03/15/2017  . IR RADIOLOGY PERIPHERAL GUIDED IV START  01/18/2017  . IR US GUIDE VASC ACCESS LEFT  01/18/2017  . RADIOFREQUENCY ABLATION N/A 03/11/2016   Procedure: LIVER THERMAL ABLATION;  Surgeon: Aletta Edouard, MD;  Location: [facility];  Service: Anesthesiology;  Laterality: N/A;  . thyroid treatment     radioactive iodine treatment      Allergies: Patient has no known allergies.  Medications: Prior to Admission medications   Medication Sig Start Date End Date Taking? Authorizing Provider  albuterol (PROAIR HFA) 108 (90 BASE) MCG/ACT inhaler Inhale 2 puffs into the lungs every 6 (six) hours as needed for wheezing or shortness of breath.   Yes [provider]  ALPRAZolam (XANAX) 0.5 MG tablet Take 0.5 mg by mouth 2 (two) times daily as needed for anxiety.    Yes [provider]  Calcium Carbonate-Vitamin D (CALTRATE 600+D) 600-400 MG-UNIT per tablet Take 1 tablet by mouth daily.     Yes [provider]  captopril (CAPOTEN) 25 MG tablet Take 25 mg by mouth 2 (two) times daily.     Yes [provider]  furosemide (LASIX) 20 MG tablet Take 20 mg by mouth daily as needed for edema.  10/07/13  Yes [provider]  glipiZIDE (GLUCOTROL XL) 5 MG 24 hr tablet Take 5 mg by mouth daily.     Yes [provider]  hydroxypropyl methylcellulose (ISOPTO TEARS) 2.5 % ophthalmic solution Place 1 drop into both eyes 3 (three) times daily as needed for dry eyes (Dry Eyes).    Yes [provider]  metFORMIN (GLUCOPHAGE-XR) 500 MG 24 hr tablet Take 500-1,000 mg by mouth 2 (two) times daily.    Yes [provider]  Multiple Vitamin (MULTIVITAMIN WITH MINERALS) TABS tablet Take 1 tablet by mouth daily. CENTRUM SILVER   Yes [provider]  pantoprazole (PROTONIX) 40 MG tablet TAKE 1 TABLET BY MOUTH EVERY DAY Patient taking differently: TAKE 1 TABLET BY MOUTH EVERY DAY IN THE EVENING 01/09/17  Yes Rehman, Mechele Dawley, MD  propranolol (INDERAL) 20 MG tablet TAKE 1 TABLET BY MOUTH TWO TIMES DAILY Patient taking differently: TAKE 1 TABLET (20 MG) BY MOUTH TWO TIMES DAILY 08/30/16  Yes Rehman, Mechele Dawley, MD  simvastatin (ZOCOR) 40 MG tablet Take 40 mg by mouth at bedtime.    Yes [provider]  sodium chloride (OCEAN) 0.65 % SOLN nasal spray Place 1 spray into both nostrils 4 (four) times daily as needed (FOR DRY NASAL PASSAGES.).   Yes [provider]     Vital Signs: Blood pressure 154/61, heart rate 70, temp 98.2, respirations 20, O2 sat 97% room air   Physical Exam awake, alert.  Chest with diminished breath sounds right base, left clear.  Heart with normal rate, some occasional ectopy noted.  Abdomen obese, soft, positive bowel sounds, nontender.  1+ bilateral lower extremity edema  Imaging: No results found.  Labs:  CBC: Recent Labs    04/20/17 1209 04/24/17 1010  WBC 2.5* 2.8*  HGB 12.6 12.4  HCT 38.4 38.9  PLT 54* 58*    COAGS: Recent Labs    04/20/17 1209  INR 1.24  APTT 32    BMP: Recent Labs    12/12/16 0758  01/18/17 1214 04/20/17 1209 04/24/17 1010  NA  --   --  138 138  K  --   --  4.0 4.1  CL  --   --  101 102  CO2  --   --  26 25  GLUCOSE  --   --  276* 281*  BUN  --   --  8 10  CALCIUM  --   --  8.7* 9.1  CREATININE 0.70 0.71 0.75 0.75  GFRNONAA  --  >60 >60 >60  GFRAA  --  >60 >60 >60    LIVER FUNCTION TESTS: Recent Labs    10/04/16 1320 04/20/17 1209 04/24/17 1010  BILITOT 1.2 1.0 1.3*  AST 28 43* 40  ALT 17 26 23   ALKPHOS 82 85 79  PROT 6.6 6.8 7.0  ALBUMIN 3.5* 3.1* 3.1*    Assessment and Plan: Patient with remote history of right breast cancer 2005,  NASH cirrhosis with segment 4 hepatocellular carcinoma, status post thermal ablation on 03/11/16 but with residual tumor noted along the inferior margin of the prior ablation site on latest f/u imaging.  History also significant for esophageal varices with splenomegaly, portal hypertension, leukopenia and thrombocytopenia as well as rising AFP.  Seen recently in follow-up by Dr. Kathlene Cote and decision made to proceed with repeat CT-guided thermal ablation of the residual [place].  Presents today for the procedure.  Details/risks of procedure, including but not limited to, internal bleeding, infection, injury to adjacent structures, anesthesia related complications discussed with patient and daughter with their understanding and consent.  Due to thrombocytopenia patient will receive 1 unit of platelets preprocedure. This procedure involves the use of X-rays and because of the nature of the planned procedure, it is possible that we will have prolonged use of CT.  Potential radiation risks to you include (but are not limited to) the following: - A slightly elevated risk for cancer  several years later in life. This risk is typically less than 0.5% percent. This risk is low in comparison to the normal incidence of human cancer, which is 33% for women and 50% for men according to the [place]. - Radiation induced injury  can include skin redness, resembling a rash, tissue breakdown / ulcers and hair loss (which can be temporary or permanent).   The likelihood of either of these occurring depends on the difficulty of the procedure and whether you are sensitive to radiation due to previous procedures, disease, or genetic conditions.   IF your procedure requires a prolonged use of radiation, you will be notified and given written instructions for further action.  It is your responsibility to monitor the irradiated area for the 2 weeks following the procedure and to notify your physician if you are concerned that you have suffered a radiation induced injury.    LABS PENDING  Electronically Signed: D. Rowe Robert, PA-C 04/26/2017, 10:34 AM   I spent a total of 30 minutes at the the patient's bedside AND on the patient's hospital floor or unit, greater than 50% of which was counseling/coordinating care for CT-guided thermal ablation of residual hepatocellular carcinoma

## 2017-04-26 NOTE — Anesthesia Preprocedure Evaluation (Addendum)
Anesthesia Evaluation  Patient identified by MRN, date of birth, ID band Patient awake    Reviewed: Allergy & Precautions, NPO status , Patient's Chart, lab work & pertinent test results, reviewed documented beta blocker date and time   Airway Mallampati: III  TM Distance: >3 FB Neck ROM: Full    Dental  (+) Dental Advisory Given, Lower Dentures, Upper Dentures   Pulmonary shortness of breath and with exertion,    Pulmonary exam normal breath sounds clear to auscultation       Cardiovascular hypertension, Pt. on medications and Pt. on home beta blockers Normal cardiovascular exam Rhythm:Regular Rate:Normal     Neuro/Psych negative neurological ROS  negative psych ROS   GI/Hepatic GERD  Medicated,(+) Cirrhosis       , Has NASH cirrhosis with esophageal varices, splenomegaly/portal hypertension, leukopenia and thrombocytopenia.    Endo/Other  diabetes, Type 2, Oral Hypoglycemic AgentsMorbid obesity  Renal/GU negative Renal ROS     Musculoskeletal  (+) Arthritis ,   Abdominal   Peds  Hematology  (+) Blood dyscrasia (Thrombocytopenia), anemia ,   Anesthesia Other Findings Day of surgery medications reviewed with the patient.  Reproductive/Obstetrics                            Anesthesia Physical Anesthesia Plan  ASA: III  Anesthesia Plan: General   Post-op Pain Management:    Induction: Intravenous  PONV Risk Score and Plan: 3 and Dexamethasone, Ondansetron and Midazolam  Airway Management Planned: Oral ETT  Additional Equipment:   Intra-op Plan:   Post-operative Plan: Possible Post-op intubation/ventilation  Informed Consent: I have reviewed the patients History and Physical, chart, labs and discussed the procedure including the risks, benefits and alternatives for the proposed anesthesia with the patient or authorized representative who has indicated his/her understanding and  acceptance.   Dental advisory given  Plan Discussed with: CRNA  Anesthesia Plan Comments:         Anesthesia Quick Evaluation

## 2017-04-26 NOTE — Sedation Documentation (Signed)
No foley placed due to a large amount of yeast and inflammation. MD aware.

## 2017-04-26 NOTE — Procedures (Signed)
Interventional Radiology Procedure Note  Procedure: CT guided thermal ablation of liver  Anesthesia: General  Complications: None  Contrast: 75 mL Isovue 370 IV  Estimated Blood Loss: < 10 mL  Findings: Area of enhancement along inferior/anterior aspect of prior ablation targeted and ablated via single NeuWave PR ablation probe for 7 min at 84 W.  Plan: PACU recovery followed by overnight observation.  Venetia Night. Kathlene Cote, M.D Pager:  [phone]

## 2017-04-26 NOTE — Sedation Documentation (Signed)
Anesthesia in to sedate and monitor. 

## 2017-04-26 NOTE — Transfer of Care (Signed)
Immediate Anesthesia Transfer of Care Note  Patient: Cheryl Velasquez  Procedure(s) Performed: CT MICROWAVE THERMAL ABLATION LIVER (N/A )  Patient Location: PACU  Anesthesia Type:General  Level of Consciousness: awake, alert  and oriented  Airway & Oxygen Therapy: Patient Spontanous Breathing and Patient connected to face mask oxygen  Post-op Assessment: Report given to RN and Post -op Vital signs reviewed and stable  Post vital signs: Reviewed and stable  Last Vitals: There were no vitals filed for this visit.  Last Pain: There were no vitals filed for this visit.       Complications: No apparent anesthesia complications

## 2017-04-26 NOTE — Anesthesia Procedure Notes (Signed)
Procedure Name: Intubation Performed by: Gean Maidens, CRNA Pre-anesthesia Checklist: Patient identified, Emergency Drugs available, Suction available, Patient being monitored and Timeout performed Patient Re-evaluated:Patient Re-evaluated prior to induction Oxygen Delivery Method: Circle system utilized Preoxygenation: Pre-oxygenation with 100% oxygen Induction Type: IV induction Ventilation: Mask ventilation without difficulty Laryngoscope Size: Mac and 4 Grade View: Grade I Tube type: Oral Tube size: 7.0 mm Number of attempts: 1 Airway Equipment and Method: Stylet Placement Confirmation: ETT inserted through vocal cords under direct vision,  positive ETCO2,  CO2 detector and breath sounds checked- equal and bilateral Secured at: 21 cm Tube secured with: Tape Dental Injury: Teeth and Oropharynx as per pre-operative assessment

## 2017-04-26 NOTE — Progress Notes (Signed)
Patient had hepatic ablation done, vital signs monitored as ordered, puncture site on right upper abdomen monitored as ordered, no bleeding , hematoma or swelling noted on the site.patient remained on bedrest.

## 2017-04-27 ENCOUNTER — Encounter (HOSPITAL_COMMUNITY): Payer: Self-pay | Admitting: Interventional Radiology

## 2017-04-27 DIAGNOSIS — D696 Thrombocytopenia, unspecified: Secondary | ICD-10-CM | POA: Diagnosis not present

## 2017-04-27 DIAGNOSIS — Z853 Personal history of malignant neoplasm of breast: Secondary | ICD-10-CM | POA: Diagnosis not present

## 2017-04-27 DIAGNOSIS — Z923 Personal history of irradiation: Secondary | ICD-10-CM | POA: Diagnosis not present

## 2017-04-27 DIAGNOSIS — C220 Liver cell carcinoma: Secondary | ICD-10-CM | POA: Diagnosis not present

## 2017-04-27 DIAGNOSIS — E119 Type 2 diabetes mellitus without complications: Secondary | ICD-10-CM | POA: Diagnosis not present

## 2017-04-27 DIAGNOSIS — R161 Splenomegaly, not elsewhere classified: Secondary | ICD-10-CM | POA: Diagnosis not present

## 2017-04-27 LAB — BPAM PLATELET PHERESIS
BLOOD PRODUCT EXPIRATION DATE: 201903132359
ISSUE DATE / TIME: 201903131028
Unit Type and Rh: 6200

## 2017-04-27 LAB — CBC
HCT: 34.4 % — ABNORMAL LOW (ref 36.0–46.0)
Hemoglobin: 11.2 g/dL — ABNORMAL LOW (ref 12.0–15.0)
MCH: 30.4 pg (ref 26.0–34.0)
MCHC: 32.6 g/dL (ref 30.0–36.0)
MCV: 93.5 fL (ref 78.0–100.0)
PLATELETS: 44 10*3/uL — AB (ref 150–400)
RBC: 3.68 MIL/uL — AB (ref 3.87–5.11)
RDW: 15 % (ref 11.5–15.5)
WBC: 1.5 10*3/uL — AB (ref 4.0–10.5)

## 2017-04-27 LAB — GLUCOSE, CAPILLARY
GLUCOSE-CAPILLARY: 276 mg/dL — AB (ref 65–99)
Glucose-Capillary: 258 mg/dL — ABNORMAL HIGH (ref 65–99)

## 2017-04-27 LAB — PREPARE PLATELET PHERESIS: UNIT DIVISION: 0

## 2017-04-27 NOTE — Anesthesia Postprocedure Evaluation (Signed)
Anesthesia Post Note  Patient: [name]  Procedure(s) Performed: CT MICROWAVE THERMAL ABLATION LIVER (N/A )     Patient location during evaluation: PACU Anesthesia Type: General Level of consciousness: awake and alert Pain management: pain level controlled Vital Signs Assessment: post-procedure vital signs reviewed and stable Respiratory status: spontaneous breathing, nonlabored ventilation, respiratory function stable and patient connected to nasal cannula oxygen Cardiovascular status: blood pressure returned to baseline and stable Postop Assessment: no apparent nausea or vomiting Anesthetic complications: no    Last Vitals:  Vitals:   04/26/17 2148 04/27/17 0527  BP:  (!) 131/55  Pulse:  70  Resp:  20  Temp:  36.6 C  SpO2: 96% 100%    Last Pain:  Vitals:   04/27/17 0527  TempSrc: Oral  PainSc:                  Catalina Gravel

## 2017-04-27 NOTE — Discharge Instructions (Signed)
Radiofrequency/Thermal  Ablation of Liver Tumors, Care After Refer to this sheet in the next few weeks. These instructions provide you with information on caring for yourself after your procedure. Your health care provider may also give you more specific instructions. Your treatment has been planned according to current medical practices, but problems sometimes occur. Call your health care provider if you have any problems or questions after your procedure. What can I expect after the procedure? After your procedure, you may have pain and discomfort in the upper abdomen. You will be given pain medicines to control this. Follow these instructions at home:  Take all medicines as directed by your health care provider.  Follow diet instructions as directed by your health care provider.  Follow instructions regarding rest and physical activity. Contact a health care provider if:  You cannot pass gas.  You cannot have a bowel movement within 2 days.  You have a skin rash.  You have a fever. Get help right away if:  You have severe or lasting abdominal pain or pain in your shoulder or back.  You have trouble swallowing or breathing.  You have severe weakness or dizziness.  You have chest pain or shortness of breath. This information is not intended to replace advice given to you by your health care provider. Make sure you discuss any questions you have with your health care provider. Document Released: 11/21/2012 Document Revised: 07/09/2015 Document Reviewed: 10/08/2012 Elsevier Interactive Patient Education  [employer].

## 2017-04-27 NOTE — Discharge Summary (Addendum)
Patient ID: Cheryl Velasquez MRN: [id number] DOB/AGE: 1940/03/02 77 y.o.  Admit date: 04/26/2017 Discharge date: 04/27/2017  Supervising Physician: Aletta Edouard  Patient Status: Cheryl Velasquez  Admission Diagnoses: Hepatocellular carcinoma  Discharge Diagnoses: Hepatocellular carcinoma, status post CT-guided percutaneous thermal ablation of left lobe hepatocellular carcinoma recurrence on 04/26/17 via general anesthesia Active Problems:   Hepatocellular carcinoma [facility])  Past Medical History:  Diagnosis Date  . Arthritis   . Cirrhosis ([place])   . Diabetes mellitus   . Dyspnea    shortness of breath with any exertion  . History of breast cancer   . History of chicken pox   . Hyperlipidemia   . Hypertension   . Thyroid disease    Past Surgical History:  Procedure Laterality Date  . ABDOMINAL HYSTERECTOMY  1987  . BREAST BIOPSY  2005  . ESOPHAGEAL BANDING N/A 12/13/2012   Procedure: ESOPHAGEAL BANDING;  Surgeon: Rogene Houston, MD;  [facility];  Service: Endoscopy;  Laterality: N/A;  . ESOPHAGOGASTRODUODENOSCOPY N/A 09/20/2012   Procedure: ESOPHAGOGASTRODUODENOSCOPY (EGD);  Surgeon: Rogene Houston, MD;  Location: [facility];  Service: Endoscopy;  Laterality: N/A;  125-moved to 12:00 Ann notified pt  . ESOPHAGOGASTRODUODENOSCOPY N/A 12/13/2012   Procedure: ESOPHAGOGASTRODUODENOSCOPY (EGD);  Surgeon: Rogene Houston, MD;  Location: [facility];  Service: Endoscopy;  Laterality: N/A;  155-moved to 220 Ann to notify pt  . ESOPHAGOGASTRODUODENOSCOPY N/A 01/11/2016   Procedure: ESOPHAGOGASTRODUODENOSCOPY (EGD);  Surgeon: Rogene Houston, MD;  Location: [facility];  Service: Endoscopy;  Laterality: N/A;  7:30  . EYE SURGERY     bilateral cataract surgery with lens implants  . HERNIA REPAIR  2004  . IR GENERIC HISTORICAL  01/19/2016   IR RADIOLOGIST EVAL & MGMT 01/19/2016 Aletta Edouard, MD [facility] INTERV RAD  . IR GENERIC HISTORICAL  03/29/2016   IR RADIOLOGIST EVAL  & MGMT 03/29/2016 Aletta Edouard, MD [facility] INTERV RAD  . IR RADIOLOGIST EVAL & MGMT  06/14/2016  . IR RADIOLOGIST EVAL & MGMT  11/24/2016  . IR RADIOLOGIST EVAL & MGMT  12/13/2016  . IR RADIOLOGIST EVAL & MGMT  03/15/2017  . IR RADIOLOGY PERIPHERAL GUIDED IV START  01/18/2017  . IR US GUIDE VASC ACCESS LEFT  01/18/2017  . RADIOFREQUENCY ABLATION N/A 03/11/2016   Procedure: LIVER THERMAL ABLATION;  Surgeon: Aletta Edouard, MD;  Location: [facility];  Service: Anesthesiology;  Laterality: N/A;  . thyroid treatment     radioactive iodine treatment     Discharged Condition: good  Hospital Course: Cheryl Velasquez is a 77 year old female with remote history of right breast cancer 2005,  NASH cirrhosis with segment 4 hepatocellular carcinoma, status post thermal ablation on 03/11/16 but with residual tumor noted along the inferior margin of the prior ablation site on latest f/u imaging.  History also significant for esophageal varices with splenomegaly, portal hypertension, leukopenia and thrombocytopenia as well as rising AFP.  Seen recently in follow-up by Dr. Kathlene Cote and decision made to proceed with repeat CT-guided thermal ablation of the residual [place].  On 04/26/17 she underwent CT-guided percutaneous thermal ablation of the left lobe hepatocellular carcinoma recurrence via general anesthesia.  The procedure was performed without immediate complications and she was admitted for overnight observation.  Overnight the patient did well with only mild right upper quadrant discomfort.  She did have some mild dyspnea post extubation and was given albuterol nebulizers with good result.  On the morning of discharge the patient was stable.  She was able to tolerate her diet, void and ambulate without significant difficulty.  She was afebrile.  Follow-up CBC revealed WBC of 1.5, hemoglobin 11.2, platelets 44k.  Above findings were discussed with Dr. Kathlene Cote and she was deemed stable for discharge at this time.  She will be  scheduled for follow-up visit in the IR clinic with Dr. Kathlene Cote in 1 month.  She will continue her current home medications and also follow-up with Dr. Laural Golden next month.  She was told to contact our service with any additional questions or concerns.    Consults: none  Significant Diagnostic Studies:  Results for orders placed or performed during the hospital encounter of 04/26/17  Glucose, capillary  Result Value Ref Range   Glucose-Capillary 237 (H) 65 - 99 mg/dL  Glucose, capillary  Result Value Ref Range   Glucose-Capillary 176 (H) 65 - 99 mg/dL   Comment 1 Document in Chart   CBC  Result Value Ref Range   WBC 1.5 (L) 4.0 - 10.5 K/uL   RBC 3.68 (L) 3.87 - 5.11 MIL/uL   Hemoglobin 11.2 (L) 12.0 - 15.0 g/dL   HCT 34.4 (L) 36.0 - 46.0 %   MCV 93.5 78.0 - 100.0 fL   MCH 30.4 26.0 - 34.0 pg   MCHC 32.6 30.0 - 36.0 g/dL   RDW 15.0 11.5 - 15.5 %   Platelets 44 (L) 150 - 400 K/uL  Glucose, capillary  Result Value Ref Range   Glucose-Capillary 206 (H) 65 - 99 mg/dL  Glucose, capillary  Result Value Ref Range   Glucose-Capillary 276 (H) 65 - 99 mg/dL  Glucose, capillary  Result Value Ref Range   Glucose-Capillary 258 (H) 65 - 99 mg/dL     Treatments: CT-guided percutaneous thermal ablation of left lobe hepatocellular carcinoma recurrence on 04/26/17 via general anesthesia  Discharge Exam: Blood pressure (!) 131/55, pulse 70, temperature 97.8 F (36.6 C), temperature source Oral, resp. rate 20, SpO2 100 %. Awake, alert.  Chest with slightly diminished breath sounds right base, left clear.  Heart with regular rate and rhythm.  Abdomen obese, soft, positive bowel sounds, mildly tender puncture site right upper quadrant.  1+ bilateral lower extremity edema.  Disposition: 01-Home or Self Care  Discharge Instructions    Call MD for:  difficulty breathing, headache or visual disturbances   Complete by:  As directed    Call MD for:  extreme fatigue   Complete by:  As directed     Call MD for:  hives   Complete by:  As directed    Call MD for:  persistant dizziness or light-headedness   Complete by:  As directed    Call MD for:  persistant nausea and vomiting   Complete by:  As directed    Call MD for:  redness, tenderness, or signs of infection (pain, swelling, redness, odor or green/yellow discharge around incision site)   Complete by:  As directed    Call MD for:  severe uncontrolled pain   Complete by:  As directed    Call MD for:  temperature >100.4   Complete by:  As directed    Change dressing (specify)   Complete by:  As directed    May apply Band-Aid to puncture site right upper abdominal region and change daily for the next 2-3 days.  May wash site with soap and water.   Diet - low sodium heart healthy   Complete by:  As directed    Driving Restrictions  Complete by:  As directed    No driving for 24 hours   Increase activity slowly   Complete by:  As directed    Lifting restrictions   Complete by:  As directed    No heavy lifting for the next 3-4 days     Allergies as of 04/27/2017   No Known Allergies     Medication List    TAKE these medications   ALPRAZolam 0.5 MG tablet Commonly known as:  XANAX Take 0.5 mg by mouth 2 (two) times daily as needed for anxiety.   CALTRATE 600+D 600-400 MG-UNIT tablet Generic drug:  Calcium Carbonate-Vitamin D Take 1 tablet by mouth daily.   captopril 25 MG tablet Commonly known as:  CAPOTEN Take 25 mg by mouth 2 (two) times daily.   furosemide 20 MG tablet Commonly known as:  LASIX Take 20 mg by mouth daily as needed for edema.   glipiZIDE 5 MG 24 hr tablet Commonly known as:  GLUCOTROL XL Take 5 mg by mouth daily.   hydroxypropyl methylcellulose / hypromellose 2.5 % ophthalmic solution Commonly known as:  ISOPTO TEARS / GONIOVISC Place 1 drop into both eyes 3 (three) times daily as needed for dry eyes (Dry Eyes).   metFORMIN 500 MG 24 hr tablet Commonly known as:  GLUCOPHAGE-XR Take  500-1,000 mg by mouth 2 (two) times daily.   multivitamin with minerals Tabs tablet Take 1 tablet by mouth daily. CENTRUM SILVER   pantoprazole 40 MG tablet Commonly known as:  PROTONIX TAKE 1 TABLET BY MOUTH EVERY DAY What changed:    how much to take  how to take this  when to take this   PROAIR HFA 108 (90 Base) MCG/ACT inhaler Generic drug:  albuterol Inhale 2 puffs into the lungs every 6 (six) hours as needed for wheezing or shortness of breath.   propranolol 20 MG tablet Commonly known as:  INDERAL TAKE 1 TABLET BY MOUTH TWO TIMES DAILY What changed:    how much to take  how to take this  when to take this   simvastatin 40 MG tablet Commonly known as:  ZOCOR Take 40 mg by mouth at bedtime.   sodium chloride 0.65 % Soln nasal spray Commonly known as:  OCEAN Place 1 spray into both nostrils 4 (four) times daily as needed (FOR DRY NASAL PASSAGES.).            Discharge Care Instructions  (From admission, onward)        Start     Ordered   04/27/17 0000  Change dressing (specify)    Comments:  May apply Band-Aid to puncture site right upper abdominal region and change daily for the next 2-3 days.  May wash site with soap and water.   04/27/17 1006     Follow-up Information    Aletta Edouard, MD Follow up.   Specialties:  Interventional Radiology, Radiology Why:  Radiology service will call you with follow-up appointment with Dr. Kathlene Cote in the [place] clinic in 1 month.  Call [phone] or [phone] with any questions. Contact information: [address] [place] [address] [phone]        Rogene Houston, MD Follow up.   Specialty:  Gastroenterology Why:  Please follow-up with Dr. Laural Golden as scheduled Contact information: [place], [place] [address] [phone]            Electronically Signed: D. Rowe Robert, PA-C 04/27/2017, 10:09 AM  I have spent Less Than 30 Minutes discharging Cheryl Velasquez.          Marland Kitchen

## 2017-05-01 ENCOUNTER — Other Ambulatory Visit: Payer: Self-pay | Admitting: *Deleted

## 2017-05-01 DIAGNOSIS — K769 Liver disease, unspecified: Secondary | ICD-10-CM

## 2017-05-02 ENCOUNTER — Telehealth: Payer: Self-pay | Admitting: Radiology

## 2017-05-02 DIAGNOSIS — C229 Malignant neoplasm of liver, not specified as primary or secondary: Secondary | ICD-10-CM | POA: Diagnosis not present

## 2017-05-02 DIAGNOSIS — Z7984 Long term (current) use of oral hypoglycemic drugs: Secondary | ICD-10-CM | POA: Diagnosis not present

## 2017-05-02 DIAGNOSIS — Z87891 Personal history of nicotine dependence: Secondary | ICD-10-CM | POA: Diagnosis not present

## 2017-05-02 DIAGNOSIS — Z853 Personal history of malignant neoplasm of breast: Secondary | ICD-10-CM | POA: Diagnosis not present

## 2017-05-02 DIAGNOSIS — I4891 Unspecified atrial fibrillation: Secondary | ICD-10-CM | POA: Diagnosis not present

## 2017-05-02 DIAGNOSIS — Z79899 Other long term (current) drug therapy: Secondary | ICD-10-CM | POA: Diagnosis not present

## 2017-05-02 DIAGNOSIS — E119 Type 2 diabetes mellitus without complications: Secondary | ICD-10-CM | POA: Diagnosis not present

## 2017-05-02 DIAGNOSIS — E669 Obesity, unspecified: Secondary | ICD-10-CM | POA: Diagnosis not present

## 2017-05-02 DIAGNOSIS — J90 Pleural effusion, not elsewhere classified: Secondary | ICD-10-CM | POA: Diagnosis not present

## 2017-05-02 DIAGNOSIS — R0602 Shortness of breath: Secondary | ICD-10-CM | POA: Diagnosis not present

## 2017-05-02 DIAGNOSIS — R069 Unspecified abnormalities of breathing: Secondary | ICD-10-CM | POA: Diagnosis not present

## 2017-05-02 DIAGNOSIS — I10 Essential (primary) hypertension: Secondary | ICD-10-CM | POA: Diagnosis not present

## 2017-05-02 DIAGNOSIS — R490 Dysphonia: Secondary | ICD-10-CM | POA: Diagnosis not present

## 2017-05-02 DIAGNOSIS — E079 Disorder of thyroid, unspecified: Secondary | ICD-10-CM | POA: Diagnosis not present

## 2017-05-02 DIAGNOSIS — E7800 Pure hypercholesterolemia, unspecified: Secondary | ICD-10-CM | POA: Diagnosis not present

## 2017-05-02 DIAGNOSIS — R0902 Hypoxemia: Secondary | ICD-10-CM | POA: Diagnosis not present

## 2017-05-02 NOTE — Telephone Encounter (Signed)
Patient's daughter called today stating patient was weak and with no appetite.  She is status post CT-guided thermal ablation of the left lobe [place] recurrence on 04/26/17 via general anesthesia.  She tolerated the procedure well and was discharged home on 04/27/17.  For 2 days post discharge the patient did well and then began to feel weak.  Her daughter states that the patient does not have abdominal pain, fever, nausea, vomiting.  Recommended that daughter have patient evaluated either in the nearest ED([place]) or by primary/GI physician (Dr. Laural Golden) with f/u labwork/imaging if necessary.  Patient scheduled to follow-up with Dr. Kathlene Cote next month in IR clinic.

## 2017-05-03 ENCOUNTER — Inpatient Hospital Stay (HOSPITAL_COMMUNITY)
Admission: AD | Admit: 2017-05-03 | Discharge: 2017-05-09 | DRG: 186 | Disposition: A | Discharge status: Skilled Nursing Facility | Payer: Medicare Other | Source: Other Acute Inpatient Hospital | Attending: Internal Medicine | Admitting: Internal Medicine

## 2017-05-03 ENCOUNTER — Encounter (HOSPITAL_COMMUNITY): Payer: Self-pay | Admitting: General Practice

## 2017-05-03 ENCOUNTER — Inpatient Hospital Stay (HOSPITAL_COMMUNITY): Payer: Medicare Other

## 2017-05-03 ENCOUNTER — Other Ambulatory Visit: Payer: Self-pay

## 2017-05-03 DIAGNOSIS — J449 Chronic obstructive pulmonary disease, unspecified: Secondary | ICD-10-CM | POA: Diagnosis present

## 2017-05-03 DIAGNOSIS — R161 Splenomegaly, not elsewhere classified: Secondary | ICD-10-CM | POA: Diagnosis present

## 2017-05-03 DIAGNOSIS — I509 Heart failure, unspecified: Secondary | ICD-10-CM | POA: Diagnosis not present

## 2017-05-03 DIAGNOSIS — E876 Hypokalemia: Secondary | ICD-10-CM | POA: Diagnosis not present

## 2017-05-03 DIAGNOSIS — K7460 Unspecified cirrhosis of liver: Secondary | ICD-10-CM | POA: Diagnosis present

## 2017-05-03 DIAGNOSIS — I2510 Atherosclerotic heart disease of native coronary artery without angina pectoris: Secondary | ICD-10-CM | POA: Diagnosis present

## 2017-05-03 DIAGNOSIS — J398 Other specified diseases of upper respiratory tract: Secondary | ICD-10-CM | POA: Diagnosis present

## 2017-05-03 DIAGNOSIS — K8020 Calculus of gallbladder without cholecystitis without obstruction: Secondary | ICD-10-CM | POA: Diagnosis present

## 2017-05-03 DIAGNOSIS — R490 Dysphonia: Secondary | ICD-10-CM | POA: Diagnosis not present

## 2017-05-03 DIAGNOSIS — E669 Obesity, unspecified: Secondary | ICD-10-CM | POA: Diagnosis not present

## 2017-05-03 DIAGNOSIS — R0902 Hypoxemia: Secondary | ICD-10-CM | POA: Diagnosis not present

## 2017-05-03 DIAGNOSIS — D72819 Decreased white blood cell count, unspecified: Secondary | ICD-10-CM | POA: Diagnosis present

## 2017-05-03 DIAGNOSIS — I4891 Unspecified atrial fibrillation: Secondary | ICD-10-CM | POA: Diagnosis not present

## 2017-05-03 DIAGNOSIS — I5033 Acute on chronic diastolic (congestive) heart failure: Secondary | ICD-10-CM | POA: Diagnosis not present

## 2017-05-03 DIAGNOSIS — E11649 Type 2 diabetes mellitus with hypoglycemia without coma: Secondary | ICD-10-CM | POA: Diagnosis not present

## 2017-05-03 DIAGNOSIS — F05 Delirium due to known physiological condition: Secondary | ICD-10-CM | POA: Diagnosis present

## 2017-05-03 DIAGNOSIS — E1165 Type 2 diabetes mellitus with hyperglycemia: Secondary | ICD-10-CM | POA: Diagnosis present

## 2017-05-03 DIAGNOSIS — Z833 Family history of diabetes mellitus: Secondary | ICD-10-CM

## 2017-05-03 DIAGNOSIS — E118 Type 2 diabetes mellitus with unspecified complications: Secondary | ICD-10-CM | POA: Diagnosis not present

## 2017-05-03 DIAGNOSIS — R269 Unspecified abnormalities of gait and mobility: Secondary | ICD-10-CM | POA: Diagnosis present

## 2017-05-03 DIAGNOSIS — J948 Other specified pleural conditions: Secondary | ICD-10-CM | POA: Diagnosis not present

## 2017-05-03 DIAGNOSIS — Z6841 Body Mass Index (BMI) 40.0 and over, adult: Secondary | ICD-10-CM | POA: Diagnosis not present

## 2017-05-03 DIAGNOSIS — E8809 Other disorders of plasma-protein metabolism, not elsewhere classified: Secondary | ICD-10-CM | POA: Diagnosis present

## 2017-05-03 DIAGNOSIS — R5381 Other malaise: Secondary | ICD-10-CM | POA: Diagnosis present

## 2017-05-03 DIAGNOSIS — K7290 Hepatic failure, unspecified without coma: Secondary | ICD-10-CM | POA: Diagnosis present

## 2017-05-03 DIAGNOSIS — E440 Moderate protein-calorie malnutrition: Secondary | ICD-10-CM | POA: Diagnosis not present

## 2017-05-03 DIAGNOSIS — D696 Thrombocytopenia, unspecified: Secondary | ICD-10-CM | POA: Diagnosis not present

## 2017-05-03 DIAGNOSIS — Z853 Personal history of malignant neoplasm of breast: Secondary | ICD-10-CM

## 2017-05-03 DIAGNOSIS — J9601 Acute respiratory failure with hypoxia: Secondary | ICD-10-CM | POA: Diagnosis present

## 2017-05-03 DIAGNOSIS — Z83438 Family history of other disorder of lipoprotein metabolism and other lipidemia: Secondary | ICD-10-CM

## 2017-05-03 DIAGNOSIS — C220 Liver cell carcinoma: Secondary | ICD-10-CM | POA: Diagnosis present

## 2017-05-03 DIAGNOSIS — E785 Hyperlipidemia, unspecified: Secondary | ICD-10-CM | POA: Diagnosis present

## 2017-05-03 DIAGNOSIS — K529 Noninfective gastroenteritis and colitis, unspecified: Secondary | ICD-10-CM | POA: Diagnosis present

## 2017-05-03 DIAGNOSIS — K7581 Nonalcoholic steatohepatitis (NASH): Secondary | ICD-10-CM | POA: Diagnosis present

## 2017-05-03 DIAGNOSIS — C229 Malignant neoplasm of liver, not specified as primary or secondary: Secondary | ICD-10-CM | POA: Diagnosis not present

## 2017-05-03 DIAGNOSIS — I429 Cardiomyopathy, unspecified: Secondary | ICD-10-CM | POA: Diagnosis not present

## 2017-05-03 DIAGNOSIS — R0602 Shortness of breath: Secondary | ICD-10-CM | POA: Diagnosis not present

## 2017-05-03 DIAGNOSIS — Z8619 Personal history of other infectious and parasitic diseases: Secondary | ICD-10-CM

## 2017-05-03 DIAGNOSIS — J9690 Respiratory failure, unspecified, unspecified whether with hypoxia or hypercapnia: Secondary | ICD-10-CM | POA: Diagnosis present

## 2017-05-03 DIAGNOSIS — I110 Hypertensive heart disease with heart failure: Secondary | ICD-10-CM | POA: Diagnosis present

## 2017-05-03 DIAGNOSIS — K219 Gastro-esophageal reflux disease without esophagitis: Secondary | ICD-10-CM | POA: Diagnosis present

## 2017-05-03 DIAGNOSIS — Z8042 Family history of malignant neoplasm of prostate: Secondary | ICD-10-CM

## 2017-05-03 DIAGNOSIS — Z9111 Patient's noncompliance with dietary regimen: Secondary | ICD-10-CM

## 2017-05-03 DIAGNOSIS — J90 Pleural effusion, not elsewhere classified: Principal | ICD-10-CM | POA: Diagnosis present

## 2017-05-03 DIAGNOSIS — R601 Generalized edema: Secondary | ICD-10-CM | POA: Diagnosis not present

## 2017-05-03 DIAGNOSIS — J9621 Acute and chronic respiratory failure with hypoxia: Secondary | ICD-10-CM | POA: Diagnosis not present

## 2017-05-03 DIAGNOSIS — K7469 Other cirrhosis of liver: Secondary | ICD-10-CM | POA: Diagnosis not present

## 2017-05-03 DIAGNOSIS — E039 Hypothyroidism, unspecified: Secondary | ICD-10-CM | POA: Diagnosis present

## 2017-05-03 DIAGNOSIS — Z8249 Family history of ischemic heart disease and other diseases of the circulatory system: Secondary | ICD-10-CM

## 2017-05-03 DIAGNOSIS — M1990 Unspecified osteoarthritis, unspecified site: Secondary | ICD-10-CM | POA: Diagnosis present

## 2017-05-03 DIAGNOSIS — Z7984 Long term (current) use of oral hypoglycemic drugs: Secondary | ICD-10-CM

## 2017-05-03 DIAGNOSIS — Z79899 Other long term (current) drug therapy: Secondary | ICD-10-CM

## 2017-05-03 HISTORY — DX: Unspecified cirrhosis of liver: K74.60

## 2017-05-03 HISTORY — DX: Malignant neoplasm of unspecified site of right female breast: C50.911

## 2017-05-03 HISTORY — DX: Gastro-esophageal reflux disease without esophagitis: K21.9

## 2017-05-03 HISTORY — DX: Nonalcoholic steatohepatitis (NASH): K75.81

## 2017-05-03 HISTORY — DX: Personal history of other medical treatment: Z92.89

## 2017-05-03 HISTORY — DX: Hypothyroidism, unspecified: E03.9

## 2017-05-03 HISTORY — DX: Liver cell carcinoma: C22.0

## 2017-05-03 HISTORY — DX: Type 2 diabetes mellitus without complications: E11.9

## 2017-05-03 LAB — COMPREHENSIVE METABOLIC PANEL
ALT: 34 U/L (ref 14–54)
AST: 48 U/L — AB (ref 15–41)
Albumin: 3 g/dL — ABNORMAL LOW (ref 3.5–5.0)
Alkaline Phosphatase: 76 U/L (ref 38–126)
Anion gap: 13 (ref 5–15)
BUN: 12 mg/dL (ref 6–20)
CALCIUM: 8.6 mg/dL — AB (ref 8.9–10.3)
CHLORIDE: 98 mmol/L — AB (ref 101–111)
CO2: 27 mmol/L (ref 22–32)
CREATININE: 0.88 mg/dL (ref 0.44–1.00)
GFR calc non Af Amer: >60 mL/min (ref >60)
GLUCOSE: 192 mg/dL — AB (ref 65–99)
Potassium: 3.5 mmol/L (ref 3.5–5.1)
SODIUM: 138 mmol/L (ref 135–145)
Total Bilirubin: 1.4 mg/dL — ABNORMAL HIGH (ref 0.3–1.2)
Total Protein: 6.8 g/dL (ref 6.5–8.1)

## 2017-05-03 LAB — TYPE AND SCREEN
ABO/RH(D): B POS
Antibody Screen: NEGATIVE

## 2017-05-03 LAB — CBC WITH DIFFERENTIAL/PLATELET
BASOS ABS: 0 10*3/uL (ref 0.0–0.1)
Basophils Relative: 0 %
EOS PCT: 0 %
Eosinophils Absolute: 0 10*3/uL (ref 0.0–0.7)
HEMATOCRIT: 38.7 % (ref 36.0–46.0)
Hemoglobin: 13 g/dL (ref 12.0–15.0)
LYMPHS ABS: 0.8 10*3/uL (ref 0.7–4.0)
LYMPHS PCT: 16 %
MCH: 30.8 pg (ref 26.0–34.0)
MCHC: 33.6 g/dL (ref 30.0–36.0)
MCV: 91.7 fL (ref 78.0–100.0)
MONO ABS: 0.6 10*3/uL (ref 0.1–1.0)
MONOS PCT: 13 %
NEUTROS ABS: 3.4 10*3/uL (ref 1.7–7.7)
Neutrophils Relative %: 71 %
PLATELETS: 52 10*3/uL — AB (ref 150–400)
RBC: 4.22 MIL/uL (ref 3.87–5.11)
RDW: 15.1 % (ref 11.5–15.5)
WBC: 4.8 10*3/uL (ref 4.0–10.5)

## 2017-05-03 LAB — PHOSPHORUS: PHOSPHORUS: 4.3 mg/dL (ref 2.5–4.6)

## 2017-05-03 LAB — MAGNESIUM: Magnesium: 1.7 mg/dL (ref 1.7–2.4)

## 2017-05-03 LAB — GLUCOSE, CAPILLARY
GLUCOSE-CAPILLARY: 181 mg/dL — AB (ref 65–99)
GLUCOSE-CAPILLARY: 192 mg/dL — AB (ref 65–99)

## 2017-05-03 LAB — ABO/RH: ABO/RH(D): B POS

## 2017-05-03 MED ORDER — IPRATROPIUM-ALBUTEROL 0.5-2.5 (3) MG/3ML IN SOLN
3.0000 mL | Freq: Four times a day (QID) | RESPIRATORY_TRACT | Status: DC
  Administered 2017-05-04 – 2017-05-05 (×6): 3 mL via RESPIRATORY_TRACT
  Filled 2017-05-03 (×7): qty 3

## 2017-05-03 MED ORDER — SODIUM CHLORIDE 0.9% FLUSH
3.0000 mL | Freq: Two times a day (BID) | INTRAVENOUS | Status: DC
  Administered 2017-05-04 – 2017-05-05 (×2): 3 mL via INTRAVENOUS

## 2017-05-03 MED ORDER — SIMVASTATIN 40 MG PO TABS
40.0000 mg | ORAL_TABLET | Freq: Every day | ORAL | Status: DC
  Administered 2017-05-04 – 2017-05-08 (×6): 40 mg via ORAL
  Filled 2017-05-03 (×7): qty 1

## 2017-05-03 MED ORDER — FUROSEMIDE 10 MG/ML IJ SOLN
40.0000 mg | Freq: Four times a day (QID) | INTRAMUSCULAR | Status: AC
  Administered 2017-05-04 (×2): 40 mg via INTRAVENOUS
  Filled 2017-05-03 (×2): qty 4

## 2017-05-03 MED ORDER — ACETAMINOPHEN 325 MG PO TABS: 650.0000 mg | ORAL_TABLET | ORAL | Status: DC | PRN

## 2017-05-03 MED ORDER — INSULIN ASPART 100 UNIT/ML ~~LOC~~ SOLN
2.0000 [IU] | SUBCUTANEOUS | Status: DC
  Administered 2017-05-04 (×2): 4 [IU] via SUBCUTANEOUS
  Administered 2017-05-04: 2 [IU] via SUBCUTANEOUS
  Administered 2017-05-04: 4 [IU] via SUBCUTANEOUS

## 2017-05-03 MED ORDER — MAGNESIUM SULFATE 2 GM/50ML IV SOLN
2.0000 g | Freq: Once | INTRAVENOUS | Status: AC
  Administered 2017-05-04: 2 g via INTRAVENOUS
  Filled 2017-05-03: qty 50

## 2017-05-03 MED ORDER — IPRATROPIUM-ALBUTEROL 0.5-2.5 (3) MG/3ML IN SOLN
3.0000 mL | RESPIRATORY_TRACT | Status: DC
  Administered 2017-05-03: 3 mL via RESPIRATORY_TRACT
  Filled 2017-05-03: qty 3

## 2017-05-03 MED ORDER — PANTOPRAZOLE SODIUM 40 MG PO TBEC
40.0000 mg | DELAYED_RELEASE_TABLET | Freq: Every day | ORAL | Status: DC
  Administered 2017-05-04 – 2017-05-09 (×6): 40 mg via ORAL
  Filled 2017-05-03 (×6): qty 1

## 2017-05-03 MED ORDER — SODIUM CHLORIDE 0.9 % IV SOLN
250.0000 mL | INTRAVENOUS | Status: DC | PRN
  Administered 2017-05-03: 250 mL via INTRAVENOUS

## 2017-05-03 MED ORDER — ENOXAPARIN SODIUM 30 MG/0.3ML ~~LOC~~ SOLN
30.0000 mg | SUBCUTANEOUS | Status: DC
  Administered 2017-05-03: 30 mg via SUBCUTANEOUS
  Filled 2017-05-03: qty 0.3

## 2017-05-03 MED ORDER — POTASSIUM CHLORIDE CRYS ER 20 MEQ PO TBCR
40.0000 meq | EXTENDED_RELEASE_TABLET | Freq: Once | ORAL | Status: AC
  Administered 2017-05-04: 40 meq via ORAL
  Filled 2017-05-03: qty 2

## 2017-05-03 MED ORDER — SODIUM CHLORIDE 0.9% FLUSH: 3.0000 mL | INTRAVENOUS | Status: DC | PRN

## 2017-05-03 MED ORDER — ONDANSETRON HCL 4 MG/2ML IJ SOLN: 4.0000 mg | Freq: Four times a day (QID) | INTRAMUSCULAR | Status: DC | PRN

## 2017-05-03 MED ORDER — SODIUM CHLORIDE 0.9 % IV SOLN: 250.0000 mL | INTRAVENOUS | Status: DC | PRN

## 2017-05-03 MED ORDER — FAMOTIDINE IN NACL 20-0.9 MG/50ML-% IV SOLN
20.0000 mg | Freq: Two times a day (BID) | INTRAVENOUS | Status: DC
  Administered 2017-05-03 – 2017-05-04 (×2): 20 mg via INTRAVENOUS
  Filled 2017-05-03 (×2): qty 50

## 2017-05-03 NOTE — Progress Notes (Signed)
eLink Physician-Brief Progress Note Patient Name: Cheryl Velasquez DOB: 12-13-1940 MRN: [id number]   Date of Service  05/03/2017  HPI/Events of Note  77 yo obese WF admitted for SOb and hypoxia Arrived on bIPAP placed on [place] shortly to assess resp status Alert and awake, she states breathing is a little better, to be placed back on biPAP  eICU Interventions  PCCM to admit biPAP as needed,  intubation NOT needed at this time Oxygen as needed Team on their way to evaluate and place orders Discussed with ICU nurse     Intervention Category Evaluation Type: New Patient Evaluation  Cheryl Velasquez 05/03/2017, 8:09 PM

## 2017-05-03 NOTE — H&P (Signed)
Name: Cheryl Velasquez MRN: [id number] DOB: April 14, 1940    ADMISSION DATE:  05/03/2017 CONSULTATION DATE:  3/20  REFERRING MD :  Dr. Carlos Levering [facility] ED[facility]  CHIEF COMPLAINT:  Dyspnea  HISTORY OF PRESENT ILLNESS:  77 year old female with PMH as below, which is significant for NASH cirrhosis, esophageal varices s/p banding,  HTN, HLD, DM, Breast CA s/p lumpectomy and radiation, diastolic CHF, and hypothyroidism. She has recently been diagnosed with small hepatocellular carcinoma s/p thermal ablation in January of 2018. She underwent MRCP in 01/2017. She underwent repeat thermal ablation 3/13 for residual HCC and was discharged the following day without complication. She did complain of some mild dyspnea post-procedurally, and was successfully treated with nebulized bronchodilators. Of note, her platelets at this time were 44.   She presented to [facility] 3/20 with complaints of SOB for one week (essentially since being discharged from [facility] after ablation).  SIGNIFICANT EVENTS  3/13 thermal ablation of [facility] 3/19 presented to [facility] with dyspnea 3/20 transferred to [facility] for pleural effusion.   STUDIES:    PAST MEDICAL HISTORY :   has a past medical history of Arthritis, Cirrhosis ([place]), Diabetes mellitus, Dyspnea, History of breast cancer, History of chicken pox, Hyperlipidemia, Hypertension, and Thyroid disease.  has a past surgical history that includes Hernia repair (2004); Abdominal hysterectomy (1987); Breast biopsy (2005); Esophagogastroduodenoscopy (N/A, 09/20/2012); Esophagogastroduodenoscopy (N/A, 12/13/2012); esophageal banding (N/A, 12/13/2012); Esophagogastroduodenoscopy (N/A, 01/11/2016); ir generic historical (01/19/2016); Eye surgery; thyroid treatment; ir generic historical (03/29/2016); Radiofrequency ablation (N/A, 03/11/2016); IR Radiologist Eval & Mgmt (06/14/2016); IR Radiologist Eval & Mgmt (11/24/2016); IR Radiologist Eval & Mgmt (12/13/2016); IR RADIOLOGY  PERIPHERAL GUIDED IV START (01/18/2017); IR US Guide Vasc Access Left (01/18/2017); IR Radiologist Eval & Mgmt (03/15/2017); and Radiology with anesthesia (N/A, 04/26/2017). Prior to Admission medications   Medication Sig Start Date End Date Taking? Authorizing Provider  albuterol (PROAIR HFA) 108 (90 BASE) MCG/ACT inhaler Inhale 2 puffs into the lungs every 6 (six) hours as needed for wheezing or shortness of breath.    [provider]  ALPRAZolam Duanne Moron) 0.5 MG tablet Take 0.5 mg by mouth 2 (two) times daily as needed for anxiety.     [provider]  Calcium Carbonate-Vitamin D (CALTRATE 600+D) 600-400 MG-UNIT per tablet Take 1 tablet by mouth daily.      [provider]  captopril (CAPOTEN) 25 MG tablet Take 25 mg by mouth 2 (two) times daily.      [provider]  furosemide (LASIX) 20 MG tablet Take 20 mg by mouth daily as needed for edema.  10/07/13   [provider]  glipiZIDE (GLUCOTROL XL) 5 MG 24 hr tablet Take 5 mg by mouth daily.      [provider]  hydroxypropyl methylcellulose (ISOPTO TEARS) 2.5 % ophthalmic solution Place 1 drop into both eyes 3 (three) times daily as needed for dry eyes (Dry Eyes).     [provider]  metFORMIN (GLUCOPHAGE-XR) 500 MG 24 hr tablet Take 500-1,000 mg by mouth 2 (two) times daily.     [provider]  Multiple Vitamin (MULTIVITAMIN WITH MINERALS) TABS tablet Take 1 tablet by mouth daily. CENTRUM SILVER    [provider]  pantoprazole (PROTONIX) 40 MG tablet TAKE 1 TABLET BY MOUTH EVERY DAY Patient taking differently: TAKE 1 TABLET BY MOUTH EVERY DAY IN THE EVENING 01/09/17   Rehman, Mechele Dawley, MD  propranolol (INDERAL) 20 MG tablet TAKE 1 TABLET BY  MOUTH TWO TIMES DAILY Patient taking differently: TAKE 1 TABLET (20 MG) BY MOUTH TWO TIMES DAILY 08/30/16   Rehman, Mechele Dawley, MD  simvastatin (ZOCOR) 40 MG tablet Take 40 mg by mouth at bedtime.     [provider]  sodium  chloride (OCEAN) 0.65 % SOLN nasal spray Place 1 spray into both nostrils 4 (four) times daily as needed (FOR DRY NASAL PASSAGES.).    [provider]   No Known Allergies  FAMILY HISTORY:  family history includes Alzheimer's disease in her sister; Cancer in her other; Dementia in her maternal aunt, sister, and sister; Diabetes in her other; Hyperlipidemia in her other; Hypertension in her other; Liver disease in her brother; Prostate cancer in her maternal aunt; Stroke in her other. SOCIAL HISTORY:  reports that  has never smoked. she has never used smokeless tobacco. She reports that she does not drink alcohol or use drugs.  REVIEW OF SYSTEMS:   Bolds are positive  Constitutional: weight loss, gain, night sweats, Fevers, chills, fatigue .  HEENT: headaches, Sore throat, sneezing, nasal congestion, post nasal drip, Difficulty swallowing, Tooth/dental problems, visual complaints visual changes, ear ache CV:  chest pain, radiates:,Orthopnea, PND, swelling in lower extremities, dizziness, palpitations, syncope.  GI  heartburn, indigestion, abdominal pain, nausea, vomiting, diarrhea, change in bowel habits, loss of appetite, bloody stools.  Resp: cough, minimally productive , hemoptysis, dyspnea, chest pain, pleuritic.  Skin: rash or itching or icterus GU: dysuria, change in color of urine, urgency or frequency. flank pain, hematuria  MS: joint pain or swelling. decreased range of motion  Psych: change in mood or affect. depression or anxiety.  Neuro: difficulty with speech, weakness, numbness, ataxia    SUBJECTIVE:   VITAL SIGNS: Pulse Rate:  [74-78] 77 (03/20 2045) Resp:  [18-28] 27 (03/20 2045) BP: (103-147)/(54-79) 147/54 (03/20 2045) SpO2:  [96 %-97 %] 97 % (03/20 2107) Weight:  [107.2 kg (236 lb 5.3 oz)] 107.2 kg (236 lb 5.3 oz) (03/20 2045)  PHYSICAL EXAMINATION: General:  Obese elderly female in NAD Neuro:  Alert, oriented, non focal HEENT:  [place]/AT PERRL, no  JVD Cardiovascular:  RRR, no MRG Lungs:  Clear bilateral breath sounds, somewhat diminished R base.  Abdomen:  Soft, non-tender, non-distended Musculoskeletal:  No acute deformity or ROM limitation Skin:  Grossly intact  No results for input(s): NA, K, CL, CO2, BUN, CREATININE, GLUCOSE in the last 168 hours. Recent Labs  Lab 04/27/17 0319  HGB 11.2*  HCT 34.4*  WBC 1.5*  PLT 44*   No results found.  ASSESSMENT / PLAN:  R sided pleural effusion: She has a history of diastolic CHF and feels as though she is volume overloaded. BNP from outside hospital significantly elevate. She has subjectively improved with lasix. However, given recent ablation and thrombocytopenia I have some concern that this could represent hemothorax. - Supplemental O2 as needed to keep SpO2 > 90% - PRN BiPAP - CXR - Continue IV diuresis and    follow electrolytes closely - Strict I&O - Will assess effusion with bedside ultrasound to look for evidence of exudate. May need CT to better characterize.  - Would likely benefit from thoracentesis if it appears uncomplicated on [place] or CT, if appears complicated would opt to consult CVTS in AM to consider chest tube placement.   NASH cirrhosis with history of esophageal varices - Follow LFT  DM - CBG monitoring and SSI - Holding home glipizide, metformin  HTN, HLD - Holding home captopril, propranolol for now  Hypothyroidism - listed in history, but she is on no chronic supplementation.  - check TSH   Diet: Heart healthy VTE ppx: enoxaparin  Dispo: ICU Code: Full  Georgann Housekeeper, AGACNP-BC [place] [facility] Pager [phone] or [phone]  05/03/2017 10:39 PM

## 2017-05-04 ENCOUNTER — Inpatient Hospital Stay (HOSPITAL_COMMUNITY): Payer: Medicare Other

## 2017-05-04 DIAGNOSIS — I509 Heart failure, unspecified: Secondary | ICD-10-CM

## 2017-05-04 DIAGNOSIS — J90 Pleural effusion, not elsewhere classified: Principal | ICD-10-CM

## 2017-05-04 DIAGNOSIS — I5033 Acute on chronic diastolic (congestive) heart failure: Secondary | ICD-10-CM

## 2017-05-04 DIAGNOSIS — J9601 Acute respiratory failure with hypoxia: Secondary | ICD-10-CM

## 2017-05-04 DIAGNOSIS — J449 Chronic obstructive pulmonary disease, unspecified: Secondary | ICD-10-CM

## 2017-05-04 LAB — GLUCOSE, CAPILLARY
GLUCOSE-CAPILLARY: 159 mg/dL — AB (ref 65–99)
GLUCOSE-CAPILLARY: 167 mg/dL — AB (ref 65–99)
GLUCOSE-CAPILLARY: 165 mg/dL — AB (ref 65–99)
Glucose-Capillary: 497 mg/dL — ABNORMAL HIGH (ref 65–99)
Glucose-Capillary: 252 mg/dL — ABNORMAL HIGH (ref 65–99)

## 2017-05-04 LAB — CBC
HEMATOCRIT: 42.3 % (ref 36.0–46.0)
HEMOGLOBIN: 14 g/dL (ref 12.0–15.0)
MCH: 30.7 pg (ref 26.0–34.0)
MCHC: 33.1 g/dL (ref 30.0–36.0)
MCV: 92.8 fL (ref 78.0–100.0)
Platelets: 54 10*3/uL — ABNORMAL LOW (ref 150–400)
RBC: 4.56 MIL/uL (ref 3.87–5.11)
RDW: 15.3 % (ref 11.5–15.5)
WBC: 4.4 10*3/uL (ref 4.0–10.5)

## 2017-05-04 LAB — BASIC METABOLIC PANEL
ANION GAP: 15 (ref 5–15)
BUN: 12 mg/dL (ref 6–20)
CHLORIDE: 97 mmol/L — AB (ref 101–111)
CO2: 30 mmol/L (ref 22–32)
Calcium: 8.7 mg/dL — ABNORMAL LOW (ref 8.9–10.3)
Creatinine, Ser: 0.83 mg/dL (ref 0.44–1.00)
GFR calc non Af Amer: >60 mL/min (ref >60)
GLUCOSE: 157 mg/dL — AB (ref 65–99)
Potassium: 3.6 mmol/L (ref 3.5–5.1)
Sodium: 142 mmol/L (ref 135–145)

## 2017-05-04 LAB — ECHOCARDIOGRAM COMPLETE
Height: 62 in
Weight: 3781.33 oz

## 2017-05-04 LAB — PHOSPHORUS: Phosphorus: 4 mg/dL (ref 2.5–4.6)

## 2017-05-04 LAB — MRSA PCR SCREENING: MRSA by PCR: NEGATIVE

## 2017-05-04 LAB — MAGNESIUM: Magnesium: 2 mg/dL (ref 1.7–2.4)

## 2017-05-04 LAB — BRAIN NATRIURETIC PEPTIDE: B Natriuretic Peptide: 210.9 pg/mL — ABNORMAL HIGH (ref 0.0–100.0)

## 2017-05-04 MED ORDER — QUETIAPINE FUMARATE 50 MG PO TABS
50.0000 mg | ORAL_TABLET | Freq: Every day | ORAL | Status: DC
  Administered 2017-05-05 – 2017-05-08 (×4): 50 mg via ORAL
  Filled 2017-05-04 (×5): qty 1

## 2017-05-04 MED ORDER — INSULIN GLARGINE 100 UNIT/ML ~~LOC~~ SOLN
30.0000 [IU] | Freq: Every day | SUBCUTANEOUS | Status: DC
  Administered 2017-05-04 – 2017-05-08 (×5): 30 [IU] via SUBCUTANEOUS
  Filled 2017-05-04 (×6): qty 0.3

## 2017-05-04 MED ORDER — FAMOTIDINE 20 MG PO TABS
20.0000 mg | ORAL_TABLET | Freq: Two times a day (BID) | ORAL | Status: DC
  Filled 2017-05-04: qty 1

## 2017-05-04 MED ORDER — DOCUSATE SODIUM 100 MG PO CAPS
100.0000 mg | ORAL_CAPSULE | Freq: Every day | ORAL | Status: DC
  Administered 2017-05-04: 100 mg via ORAL
  Filled 2017-05-04 (×2): qty 1

## 2017-05-04 MED ORDER — HALOPERIDOL LACTATE 5 MG/ML IJ SOLN
5.0000 mg | INTRAMUSCULAR | Status: DC | PRN
  Administered 2017-05-04 – 2017-05-05 (×2): 5 mg via INTRAVENOUS
  Filled 2017-05-04: qty 1

## 2017-05-04 MED ORDER — INSULIN ASPART 100 UNIT/ML ~~LOC~~ SOLN
14.0000 [IU] | Freq: Once | SUBCUTANEOUS | Status: AC
  Administered 2017-05-04: 14 [IU] via SUBCUTANEOUS

## 2017-05-04 MED ORDER — INSULIN ASPART 100 UNIT/ML ~~LOC~~ SOLN
0.0000 [IU] | Freq: Three times a day (TID) | SUBCUTANEOUS | Status: DC
  Administered 2017-05-05 – 2017-05-06 (×3): 4 [IU] via SUBCUTANEOUS
  Administered 2017-05-06 – 2017-05-07 (×2): 3 [IU] via SUBCUTANEOUS
  Administered 2017-05-07: 4 [IU] via SUBCUTANEOUS
  Administered 2017-05-07 – 2017-05-08 (×3): 3 [IU] via SUBCUTANEOUS
  Administered 2017-05-09: 15 [IU] via SUBCUTANEOUS

## 2017-05-04 MED ORDER — ORAL CARE MOUTH RINSE
15.0000 mL | Freq: Two times a day (BID) | OROMUCOSAL | Status: DC
  Administered 2017-05-04: 15 mL via OROMUCOSAL

## 2017-05-04 MED ORDER — CHLORHEXIDINE GLUCONATE 0.12 % MT SOLN
15.0000 mL | Freq: Two times a day (BID) | OROMUCOSAL | Status: DC
  Administered 2017-05-04: 15 mL via OROMUCOSAL

## 2017-05-04 MED ORDER — HALOPERIDOL LACTATE 5 MG/ML IJ SOLN
INTRAMUSCULAR | Status: AC
  Administered 2017-05-04: 5 mg via INTRAVENOUS
  Filled 2017-05-04: qty 1

## 2017-05-04 MED ORDER — INSULIN ASPART 100 UNIT/ML ~~LOC~~ SOLN
6.0000 [IU] | Freq: Three times a day (TID) | SUBCUTANEOUS | Status: DC
  Administered 2017-05-05 – 2017-05-09 (×12): 6 [IU] via SUBCUTANEOUS

## 2017-05-04 MED ORDER — INSULIN ASPART 100 UNIT/ML ~~LOC~~ SOLN
0.0000 [IU] | Freq: Every day | SUBCUTANEOUS | Status: DC
  Administered 2017-05-04: 2 [IU] via SUBCUTANEOUS
  Administered 2017-05-05: 3 [IU] via SUBCUTANEOUS

## 2017-05-04 MED ORDER — ASPIRIN EC 81 MG PO TBEC
81.0000 mg | DELAYED_RELEASE_TABLET | Freq: Every day | ORAL | Status: DC
  Administered 2017-05-04 – 2017-05-09 (×6): 81 mg via ORAL
  Filled 2017-05-04 (×6): qty 1

## 2017-05-04 MED ORDER — FUROSEMIDE 20 MG PO TABS
20.0000 mg | ORAL_TABLET | Freq: Every day | ORAL | Status: DC
  Administered 2017-05-04 – 2017-05-09 (×6): 20 mg via ORAL
  Filled 2017-05-04 (×6): qty 1

## 2017-05-04 NOTE — Progress Notes (Signed)
  Echocardiogram 2D Echocardiogram has been performed.  Jannett Celestine 05/04/2017, 10:47 AM

## 2017-05-04 NOTE — Progress Notes (Signed)
Critical care attending progress note:  Reason for admission: respiratory insufficiency.    Summary of admission history and hospital course:77yoF with COPD (moderate degree obstruction on PFT's), Grade 2 Diastolic CHF, NASH cirrhosis, HTN, HCC s/p ablation on 7/13. She did not take any of her lasix the week of the ablation and now presents with a right-sided pleural effusion. It was initially very large at outside hospital prior to transfer, now improved to moderate/large size (on repeat CXR on my review) after diuresis.   The following issues were addressed on rounds today:  1. HFpEF with effusions. Currently on 4lpm [place].  Large R sided pleural effusion on CXR with modest underlying increase interstitial markings. JVP elevated at 4cm above sternal angle.  S1 and S2 normal.  No murmurs or gallops.  Patient currently in atrial fibrillation rate controlled at 90.  Blood pressure 116/71.  On examination vesicular breath sounds throughout.  With egophony and decreased fremitus at the right base.  Effusion on ultrasonography yesterday deemed too small to tap. -Diastolic heart failure-continue gentle diuresis and follow electrolytes. -Clinical improvement with diuresis.  Will defer thoracentesis at this time.  Reconsider if shortness of breath worsens.  2. Poorly controlled T2DM: non-adherence to diet and possibly to anti-diabetic agents has patient is visually impaired and spends much of her day alone. -May benefit from simple insulin regimen on discharge such as fixed mix insulin 70/30 or nighttime Lantus with oral hypoglycemic agents. -As oral intake uncertain in hospital will manage with sliding scale insulin and basal Lantus dose.  3. Delirium: periodic confusion when she wakes up. Mentation subsequently improves.  Worsening agitation in the evening. -Seroquel for sundowning - would benefit from transfer out of ICU to prevent worsening delirium    4.  Thrombocytopenia: dates back to 04/24/17 at  least. Possibly nash related.  -Hold PPI as may be culprit.  -May need hematology input - U/S abdomen to evaluate splenomegaly.  Safety and quality of care items: Respiratory insufficiency is resolving.  Delirium is worsening.  Would benefit from moving out of the ICU.   Early mobility: Mobilize to chair Secondary prevention CAD: On Zocor.  Start ASA. Line removal: Peripheral IVs only. Stress ulcer prophylaxis: Not required.  We will stop home pantoprazole due to low PLT. Laxative regimen: Docusate Nutritional status: low risk Enteral nutrition: carbohydrate controlled. Foley catheter removal: no Foley in place Electrolyte repletion: none. DVT prophylaxis: On hold due to thrombocytopenia Transfusion indications: Hemoglobin 14.  No indication for transfusion. Antibiotic de-escalation: On no antibiotics. Sepsis care elements: No sepsis. Glycemic target: 70-180 Steroid use: On no systemic steroids.  Summary and disposition: worsening delirium related to prolonged hospitalization and facility transfer. Continue gentle diuresis. Transfer to floor once delirium controlled.    Services provided include examination of the patient, review of relevant ancillary tests, prescription of lifesaving therapies, review of medications and prophylactic therapy and multidisciplinary rounding.  Kipp Brood, MD Critical Care Attending. [facility]. Pager: [phone] After hours: (336)

## 2017-05-05 ENCOUNTER — Inpatient Hospital Stay (HOSPITAL_COMMUNITY): Payer: Medicare Other

## 2017-05-05 DIAGNOSIS — D696 Thrombocytopenia, unspecified: Secondary | ICD-10-CM

## 2017-05-05 DIAGNOSIS — J9621 Acute and chronic respiratory failure with hypoxia: Secondary | ICD-10-CM

## 2017-05-05 LAB — PHOSPHORUS: PHOSPHORUS: 4.8 mg/dL — AB (ref 2.5–4.6)

## 2017-05-05 LAB — GLUCOSE, CAPILLARY
GLUCOSE-CAPILLARY: 120 mg/dL — AB (ref 65–99)
GLUCOSE-CAPILLARY: 199 mg/dL — AB (ref 65–99)
Glucose-Capillary: 161 mg/dL — ABNORMAL HIGH (ref 65–99)
Glucose-Capillary: 179 mg/dL — ABNORMAL HIGH (ref 65–99)
Glucose-Capillary: 272 mg/dL — ABNORMAL HIGH (ref 65–99)

## 2017-05-05 LAB — BASIC METABOLIC PANEL
Anion gap: 15 (ref 5–15)
BUN: 13 mg/dL (ref 6–20)
CALCIUM: 8.4 mg/dL — AB (ref 8.9–10.3)
CO2: 32 mmol/L (ref 22–32)
Chloride: 97 mmol/L — ABNORMAL LOW (ref 101–111)
Creatinine, Ser: 0.87 mg/dL (ref 0.44–1.00)
GFR calc non Af Amer: >60 mL/min (ref >60)
Glucose, Bld: 125 mg/dL — ABNORMAL HIGH (ref 65–99)
Potassium: 3 mmol/L — ABNORMAL LOW (ref 3.5–5.1)
SODIUM: 144 mmol/L (ref 135–145)

## 2017-05-05 LAB — LACTATE DEHYDROGENASE, PLEURAL OR PERITONEAL FLUID: LD FL: 98 U/L — AB (ref 3–23)

## 2017-05-05 LAB — PROTEIN, PLEURAL OR PERITONEAL FLUID

## 2017-05-05 LAB — CBC
HCT: 39.8 % (ref 36.0–46.0)
HEMOGLOBIN: 12.8 g/dL (ref 12.0–15.0)
MCH: 29.7 pg (ref 26.0–34.0)
MCHC: 32.2 g/dL (ref 30.0–36.0)
MCV: 92.3 fL (ref 78.0–100.0)
Platelets: 45 10*3/uL — ABNORMAL LOW (ref 150–400)
RBC: 4.31 MIL/uL (ref 3.87–5.11)
RDW: 15.3 % (ref 11.5–15.5)
WBC: 2.7 10*3/uL — AB (ref 4.0–10.5)

## 2017-05-05 LAB — BODY FLUID CELL COUNT WITH DIFFERENTIAL
EOS FL: 0 %
LYMPHS FL: 75 %
MONOCYTE-MACROPHAGE-SEROUS FLUID: 15 % — AB (ref 50–90)
Neutrophil Count, Fluid: 10 % (ref 0–25)
Total Nucleated Cell Count, Fluid: 76 cu mm (ref 0–1000)

## 2017-05-05 LAB — MAGNESIUM: MAGNESIUM: 2 mg/dL (ref 1.7–2.4)

## 2017-05-05 LAB — GLUCOSE, PLEURAL OR PERITONEAL FLUID: GLUCOSE FL: 159 mg/dL

## 2017-05-05 MED ORDER — POTASSIUM CHLORIDE CRYS ER 20 MEQ PO TBCR
30.0000 meq | EXTENDED_RELEASE_TABLET | ORAL | Status: AC
  Administered 2017-05-05 (×2): 30 meq via ORAL
  Filled 2017-05-05 (×2): qty 1

## 2017-05-05 NOTE — Progress Notes (Signed)
Inpatient Diabetes Program Recommendations  AACE/ADA: New Consensus Statement on Inpatient Glycemic Control (2015)  Target Ranges:  Prepandial:   less than 140 mg/dL      Peak postprandial:   less than 180 mg/dL (1-2 hours)      Critically ill patients:  140 - 180 mg/dL   Results for KENZY, CAMPOVERDE (MRN [id number]) as of 05/05/2017 11:07  Ref. Range 05/04/2017 08:03 05/04/2017 11:16 05/04/2017 15:38 05/04/2017 20:05 05/05/2017 07:42  Glucose-Capillary Latest Ref Range: 65 - 99 mg/dL 167 (H) 165 (H) 497 (H) 252 (H) 120 (H)   Review of Glycemic Control  Diabetes history: DM2 Outpatient Diabetes medications: Glipizide XL 5 mg daily, Metformin 500 mg BID (pt varies dose based on stool consistency) Current orders for Inpatient glycemic control: Lantus 30 units QHS, Novolog 0-20 units TID with meals, Novolog 0-5 units QHS, Novolog 6 units TID with meals  NOTE: Noted consult for Diabetes Coordinator "Pt had a CBG of 497 - MD wants to start her on 70/30 BID Neice will have to inject". Spoke with Everlene Other, RN and she reports that patient is noncompliant with diet and they have found that patient's niece lives 45 mins away from her and patient will likely need ALF or SNF (to have PT consult today).  T[place], RN also reports that patient can not see well and could likely not self-inject insulin on her own safely. Everlene Other, RN states no need for Diabetes Coordinator to see patient at this time. Will sign off consult and RN or MD can re-consult again if need arises.  Noted Lantus 30 units QHS started last night and Novolog meal coverage started this morning.  Thanks, Barnie Alderman, RN, MSN, CDE Diabetes Coordinator Inpatient Diabetes Program [phone] (Team Pager from 8am to 5pm)

## 2017-05-05 NOTE — Progress Notes (Signed)
Pt to have bedside thoracentesis per MD.  Niece here staying with pt during the procedure

## 2017-05-05 NOTE — Progress Notes (Addendum)
Critical care attending progress note:  Reason for admission: acute hypoxic respiratory insufficiency.    Summary of admission history and hospital course:77yoF with COPD (moderate degree obstruction on PFT's), Grade 2 Diastolic CHF, NASH cirrhosis, HTN, HCC s/p ablation on 7/13. She did not take any of her lasix the week of the ablation and now presents with a right-sided pleural effusion. It was initially very large at outside hospital prior to transfer, now improved to moderate/large size (on repeat CXR on my review) after diuresis.   The following issues were addressed on rounds today:  1. HFpEF with effusions. Currently on 4lpm [place].  Large R sided pleural effusion on CXR with modest underlying increase interstitial markings. JVP elevated at 4cm above sternal angle.  S1 and S2 normal.  No murmurs or gallops.  Patient currently in atrial fibrillation rate controlled at 90.  Blood pressure 116/71.  On examination vesicular breath sounds throughout.  With egophony and decreased fremitus at the right base.  Effusion on ultrasonography yesterday deemed too small to tap.  Repeat CXR shows persistent effusion. U -Diastolic heart failure-continue gentle diuresis and follow electrolytes. -Clinical improvement with diuresis.   - Ultrasound guided thoracentesis successfully performed.  1.3 L of serosanguineous fluid removed.  Completion ultrasound showed no residual fluid and no pneumothorax.  Saturations have improved to 97% on 2 L post thoracentesis.  2. Poorly controlled T2DM: non-adherence to diet and possibly to anti-diabetic agents has patient is visually impaired and spends much of her day alone. -May benefit from simple insulin regimen on discharge such as fixed mix insulin 70/30 or nighttime Lantus with oral hypoglycemic agents. -As oral intake uncertain in hospital will manage with sliding scale insulin and basal Lantus dose.  3. Delirium: Very delirious last night.  He is calm down with with  haloperidol.  Really well oriented this morning.  Niece reports the patient is quite functional at home and has been working until recently. -Seroquel for sundowning - Would benefit from transfer out of ICU to prevent worsening delirium    4.  Thrombocytopenia: review of outside records reveal this to be a known condition dating back at least to 2016. She was followed by Dr Tressie Stalker at [facility] in [place]. Counts seem to be in the range of 50K and leukopenia around 2.K, consistent with her current titers.  The impression was that the leukopenia and thrombocytopenia were due to her cirrhosis.  5.  Hepatic cirrhosis with resultant splenomegaly.  Likely due to nonalcoholic steatohepatitis.  Currently not jaundiced and not encephalopathic.  5. Rate controlled atrial fibrillation. On no anticoagulation likely because of thrombocytopenia. Well rate controlled.  Safety and quality of care items: Respiratory insufficiency is resolving.  Delirium is worsening.  Would benefit from moving out of the ICU.   Early mobility: Mobilize to chair Secondary prevention CAD: On Zocor.  Start ASA. Line removal: Peripheral IVs only. Stress ulcer prophylaxis: Not required.  We will stop home pantoprazole due to low PLT. Laxative regimen: Patient has chronic diarrhea. Nutritional status: low risk Enteral nutrition: carbohydrate controlled. Foley catheter removal: no Foley in place Electrolyte repletion: none. DVT prophylaxis: On hold due to thrombocytopenia Transfusion indications: Hemoglobin 14.  No indication for transfusion. Antibiotic de-escalation: On no antibiotics. Sepsis care elements: No sepsis. Glycemic target: 70-180 Steroid use: On no systemic steroids.  Summary and disposition: worsening delirium related to prolonged hospitalization and facility transfer. Continue gentle diuresis. Transfer to floor once delirium controlled.    Services provided include examination of the  patient,  review of relevant ancillary tests, prescription of lifesaving therapies, review of medications and prophylactic therapy and multidisciplinary rounding.  Kipp Brood, MD Critical Care Attending. [facility]. Pager: [phone] After hours: ([phone])

## 2017-05-05 NOTE — Progress Notes (Signed)
Called report to [place] spoke to [place] Pt will travel to 6N bed 3 in recliner Niece is aware of pending transfer

## 2017-05-05 NOTE — Progress Notes (Signed)
Pt transferred to [place] bed 3 per recliner. Niece has patients dentures and glasses at home

## 2017-05-05 NOTE — Procedures (Signed)
Thoracentesis Procedure Note  Pre-operative Diagnosis: right pleural effusion.  Post-operative Diagnosis: same  Indications: Large right effusion with dyspnea.  Procedure Details  Consent: Informed consent was obtained. Risks of the procedure were discussed including: infection, bleeding, pain, pneumothorax.  Under sterile conditions the patient was positioned. Betadine solution and sterile drapes were utilized.  1% buffered lidocaine was used to anesthetize the 6th rib space. Fluid was obtained without any difficulties and minimal blood loss.  A dressing was applied to the wound and wound care instructions were provided.   Findings 1300 ml of bloody pleural fluid was obtained. A sample was sent to Pathology for cytogenetics, flow, and cell counts, as well as for infection analysis.  Complications:  None; patient tolerated the procedure well.          Condition: stable  Plan No residual effusion or pneumothorax on completion ultrasound.. Bed Rest for 0 hours. Tylenol 650 mg. for pain.  Attending Attestation: I performed the procedure.

## 2017-05-05 NOTE — Progress Notes (Signed)
[facility] ADULT ICU REPLACEMENT PROTOCOL FOR AM LAB REPLACEMENT ONLY  The patient does apply for the [facility] Adult ICU Electrolyte Replacment Protocol based on the criteria listed below:   1. Is GFR >/= 40 ml/min? Yes.    Patient's GFR today is >60 2. Is urine output >/= 0.5 ml/kg/hr for the last 6 hours? Yes.   Patient's UOP is 1.7 ml/kg/hr 3. Is BUN < 60 mg/dL? Yes.    Patient's BUN today is 13 4. Abnormal electrolyte(s): k3.0 5. Ordered repletion with: Per protocol 6. If a panic level lab has been reported, has the CCM MD in charge been notified? Yes.  .   Physician:  Earl Many 05/05/2017 6:11 AM

## 2017-05-06 LAB — GLUCOSE, CAPILLARY
GLUCOSE-CAPILLARY: 188 mg/dL — AB (ref 65–99)
GLUCOSE-CAPILLARY: 120 mg/dL — AB (ref 65–99)
Glucose-Capillary: 145 mg/dL — ABNORMAL HIGH (ref 65–99)
Glucose-Capillary: 148 mg/dL — ABNORMAL HIGH (ref 65–99)

## 2017-05-06 LAB — CBC
HEMATOCRIT: 38.2 % (ref 36.0–46.0)
HEMOGLOBIN: 12.9 g/dL (ref 12.0–15.0)
MCH: 31.3 pg (ref 26.0–34.0)
MCHC: 33.8 g/dL (ref 30.0–36.0)
MCV: 92.7 fL (ref 78.0–100.0)
Platelets: 49 10*3/uL — ABNORMAL LOW (ref 150–400)
RBC: 4.12 MIL/uL (ref 3.87–5.11)
RDW: 15.5 % (ref 11.5–15.5)
WBC: 2.1 10*3/uL — ABNORMAL LOW (ref 4.0–10.5)

## 2017-05-06 LAB — BASIC METABOLIC PANEL
Anion gap: 14 (ref 5–15)
BUN: 13 mg/dL (ref 6–20)
CHLORIDE: 96 mmol/L — AB (ref 101–111)
CO2: 28 mmol/L (ref 22–32)
Calcium: 7.8 mg/dL — ABNORMAL LOW (ref 8.9–10.3)
Creatinine, Ser: 0.87 mg/dL (ref 0.44–1.00)
GFR calc Af Amer: >60 mL/min (ref >60)
GFR calc non Af Amer: >60 mL/min (ref >60)
GLUCOSE: 134 mg/dL — AB (ref 65–99)
POTASSIUM: 4.7 mmol/L (ref 3.5–5.1)
Sodium: 138 mmol/L (ref 135–145)

## 2017-05-06 LAB — PHOSPHORUS: Phosphorus: 4.3 mg/dL (ref 2.5–4.6)

## 2017-05-06 LAB — MAGNESIUM: Magnesium: 1.9 mg/dL (ref 1.7–2.4)

## 2017-05-06 NOTE — Evaluation (Signed)
Physical Therapy Evaluation Patient Details Name: Cheryl Velasquez MRN: [id number] DOB: [birth year] Today's Date: 05/06/2017   History of Present Illness  Pt is a 77 y.o. female presenting with c/o SOB for one week. PMHx: NASH cirrhosis, esophageal varices s/p banding, HTN, HLD, DM, Breast CA s/p lumpectomy and radiation, diastolic CHF, hypothyroidism, recent diagnosis of small hepatocellular carcinoma s/p thermal ablation 02/2016, MRCP 01/2017, and repeat thermal ablation 04/26/2017.    Clinical Impression  Pt admitted with above diagnosis. Pt currently with functional limitations due to the deficits listed below (see PT Problem List). PTA pt lived at home alone, independent with functional mobility. On eval, she required min assist bed mobility, min guard assist sit to stand, and min guard assist ambulation 180 feet with RW.  Pt will benefit from skilled PT to increase their independence and safety with mobility to allow discharge to the venue listed below.  Pt may be a good candidate for [facility] program.      Follow Up Recommendations Home health PT;Supervision/Assistance - 24 hour(24 hour supervision initially)    Equipment Recommendations  None recommended by PT    Recommendations for Other Services       Precautions / Restrictions Precautions Precautions: Fall;Other (comment) Precaution Comments: watch O2      Mobility  Bed Mobility Overal bed mobility: Needs Assistance Bed Mobility: Sit to Supine       Sit to supine: Min assist;HOB elevated   General bed mobility comments: assist with BLE into bed  Transfers Overall transfer level: Needs assistance Equipment used: Rolling walker (2 wheeled) Transfers: Sit to/from Stand Sit to Stand: Min guard         General transfer comment: min guard for safety  Ambulation/Gait Ambulation/Gait assistance: Min guard Ambulation Distance (Feet): 180 Feet Assistive device: Rolling walker (2 wheeled) Gait  Pattern/deviations: Step-through pattern;Decreased stride length Gait velocity: decreased Gait velocity interpretation: Below normal speed for age/gender General Gait Details: SpO2 95% on 2 L O2 at rest prior to mobility. Pt ambulated on RA with desat to 90%. 2 L O2 replaced upon return to bed at end of session.  Max HR 132 during mobility.   Stairs            Wheelchair Mobility    Modified Rankin (Stroke Patients Only)       Balance Overall balance assessment: Mild deficits observed, not formally tested                                           Pertinent Vitals/Pain Pain Assessment: No/denies pain    Home Living Family/patient expects to be discharged to:: Private residence Living Arrangements: Alone Available Help at Discharge: Family;Friend(s);Available PRN/intermittently Type of Home: House Home Access: Stairs to enter   Entrance Stairs-Number of Steps: 1+1 Home Layout: One level Home Equipment: Walker - 2 wheels;Cane - single point;Shower seat;Wheelchair - manual      Prior Function Level of Independence: Independent with assistive device(s)         Comments: Occasional use of RW, neighbor brings her groceries     Hand Dominance        Extremity/Trunk Assessment   Upper Extremity Assessment Upper Extremity Assessment: Generalized weakness    Lower Extremity Assessment Lower Extremity Assessment: Generalized weakness    Cervical / Trunk Assessment Cervical / Trunk Assessment: Kyphotic  Communication   Communication: No difficulties  Cognition Arousal/Alertness: Awake/alert Behavior During Therapy: WFL for tasks assessed/performed Overall Cognitive Status: Within Functional Limits for tasks assessed                                        General Comments      Exercises     Assessment/Plan    PT Assessment Patient needs continued PT services  PT Problem List Decreased strength;Decreased  mobility;Decreased activity tolerance;Cardiopulmonary status limiting activity       PT Treatment Interventions Therapeutic activities;Gait training;Therapeutic exercise;Patient/family education;Stair training;Balance training;Functional mobility training    PT Goals (Current goals can be found in the Care Plan section)  Acute Rehab PT Goals Patient Stated Goal: return home PT Goal Formulation: With patient Time For Goal Achievement: 05/20/17 Potential to Achieve Goals: Good    Frequency Min 3X/week   Barriers to discharge        Co-evaluation               AM-PAC PT "6 Clicks" Daily Activity  Outcome Measure Difficulty turning over in bed (including adjusting bedclothes, sheets and blankets)?: A Little Difficulty moving from lying on back to sitting on the side of the bed? : A Little Difficulty sitting down on and standing up from a chair with arms (e.g., wheelchair, bedside commode, etc,.)?: A Little Help needed moving to and from a bed to chair (including a wheelchair)?: A Little Help needed walking in hospital room?: A Little Help needed climbing 3-5 steps with a railing? : A Little 6 Click Score: 18    End of Session Equipment Utilized During Treatment: Gait belt;Oxygen Activity Tolerance: Patient tolerated treatment well Patient left: in bed;with call bell/phone within reach;with bed alarm set;with family/visitor present Nurse Communication: Mobility status PT Visit Diagnosis: Muscle weakness (generalized) (M62.81);Difficulty in walking, not elsewhere classified (R26.2)    Time: 9244-6286 PT Time Calculation (min) (ACUTE ONLY): 29 min   Charges:   PT Evaluation $PT Eval Moderate Complexity: 1 Mod PT Treatments $Gait Training: 8-22 mins   PT G Codes:        Lorrin Goodell, PT  Office # [phone] Pager [phone]   Lorriane Shire 05/06/2017, 3:07 PM

## 2017-05-06 NOTE — Evaluation (Signed)
Occupational Therapy Evaluation Patient Details Name: Cheryl Velasquez MRN: [id number] DOB: [birth year] Today's Date: 05/06/2017    History of Present Illness Pt is a 77 y.o. female presenting with c/o SOB for one week. PMHx: NASH cirrhosis, esophageal varices s/p banding, HTN, HLD, DM, Breast CA s/p lumpectomy and radiation, diastolic CHF, hypothyroidism, recent diagnosis of small hepatocellular carcinoma s/p thermal ablation 02/2016, MRCP 01/2017, and repeat thermal ablation 04/26/2017.   Clinical Impression   Pt reports she was mod I with ADL PTA. Currently pt overall min guard for ADL and functional mobility. SpO2 92-94% on RA with activity; DOE 2-3/4 with audible wheezing at times. Pt planning to d/c home alone with intermittent supervision from family/friends. Recommending HHOT for follow up to maximize independence and safety with ADL and functional mobility upon return home. Pt may be a good candidate for Home First program. Pt would benefit from continued skilled OT to address established goals.    Follow Up Recommendations  Home health OT;Supervision/Assistance - 24 hour(24/7 S initially)    Equipment Recommendations  3 in 1 bedside commode    Recommendations for Other Services       Precautions / Restrictions Precautions Precautions: Fall Precaution Comments: watch O2 Restrictions Weight Bearing Restrictions: No      Mobility Bed Mobility               General bed mobility comments: Pt sitting EOB upon arrival  Transfers Overall transfer level: Needs assistance Equipment used: Rolling walker (2 wheeled) Transfers: Sit to/from Stand Sit to Stand: Min guard         General transfer comment: Cues for hand placement, min guard for safety    Balance Overall balance assessment: Mild deficits observed, not formally tested                                         ADL either performed or assessed with clinical judgement   ADL Overall ADL's :  Needs assistance/impaired Eating/Feeding: Set up;Sitting   Grooming: Min guard;Standing;Wash/dry hands   Upper Body Bathing: Set up;Supervision/ safety;Sitting   Lower Body Bathing: Min guard;Sit to/from stand   Upper Body Dressing : Set up;Supervision/safety;Sitting   Lower Body Dressing: Min guard;Sit to/from stand   Toilet Transfer: Min guard;Ambulation;Comfort height toilet;Grab bars;RW   Toileting- Clothing Manipulation and Hygiene: Supervision/safety;Sitting/lateral lean Toileting - Clothing Manipulation Details (indicate cue type and reason): for peri care     Functional mobility during ADLs: Min guard;Rolling walker General ADL Comments: SpO2 92-94% on RA with activity; DOE 2-3/4. Reapplied supplemental O2 at end of session.     Vision         Perception     Praxis      Pertinent Vitals/Pain Pain Assessment: Faces Faces Pain Scale: No hurt     Hand Dominance     Extremity/Trunk Assessment Upper Extremity Assessment Upper Extremity Assessment: Generalized weakness   Lower Extremity Assessment Lower Extremity Assessment: Defer to PT evaluation   Cervical / Trunk Assessment Cervical / Trunk Assessment: Kyphotic   Communication Communication Communication: No difficulties   Cognition Arousal/Alertness: Awake/alert Behavior During Therapy: WFL for tasks assessed/performed Overall Cognitive Status: Within Functional Limits for tasks assessed  General Comments       Exercises     Shoulder Instructions      Home Living Family/patient expects to be discharged to:: Private residence Living Arrangements: Alone Available Help at Discharge: Family;Friend(s);Available PRN/intermittently Type of Home: House Home Access: Stairs to enter Entrance Stairs-Number of Steps: 1+1   Home Layout: One level     Bathroom Shower/Tub: Tub/shower unit;Curtain   Biochemist, clinical: Standard     Home Equipment:  Environmental consultant - 2 wheels;Cane - single point;Shower seat;Wheelchair - manual          Prior Functioning/Environment Level of Independence: Independent with assistive device(s)        Comments: Occasional use of RW, neighbor brings her groceries        OT Problem List: Decreased strength;Decreased activity tolerance;Impaired balance (sitting and/or standing);Decreased knowledge of use of DME or AE;Cardiopulmonary status limiting activity;Obesity      OT Treatment/Interventions: Self-care/ADL training;Therapeutic exercise;Energy conservation;DME and/or AE instruction;Therapeutic activities;Patient/family education;Balance training    OT Goals(Current goals can be found in the care plan section) Acute Rehab OT Goals Patient Stated Goal: return home OT Goal Formulation: With patient Time For Goal Achievement: 05/20/17 Potential to Achieve Goals: Good ADL Goals Pt Will Perform Tub/Shower Transfer: with modified independence;Tub transfer;ambulating;shower seat;rolling walker Additional ADL Goal #1: Pt will gather ADL items and perform ADL with mod I. Additional ADL Goal #2: Pt will independently verbally recall 3 energy conservation strategies and utilize during ADL.  OT Frequency: Min 2X/week   Barriers to D/C: Decreased caregiver support  pt lives alone       Co-evaluation              AM-PAC PT "6 Clicks" Daily Activity     Outcome Measure Help from another person eating meals?: A Little Help from another person taking care of personal grooming?: A Little Help from another person toileting, which includes using toliet, bedpan, or urinal?: A Little Help from another person bathing (including washing, rinsing, drying)?: A Little Help from another person to put on and taking off regular upper body clothing?: A Little Help from another person to put on and taking off regular lower body clothing?: A Little 6 Click Score: 18   End of Session Equipment Utilized During Treatment:  Rolling walker;Oxygen Nurse Communication: Mobility status;Other (comment)(SpO2 during mobility)  Activity Tolerance: Patient tolerated treatment well Patient left: in chair;with call bell/phone within reach;with chair alarm set  OT Visit Diagnosis: Unsteadiness on feet (R26.81);Muscle weakness (generalized) (M62.81)                Time: 7106-2694 OT Time Calculation (min): 23 min Charges:  OT General Charges $OT Visit: 1 Visit OT Evaluation $OT Eval Moderate Complexity: 1 Mod OT Treatments $Self Care/Home Management : 8-22 mins G-Codes:     Penina Reisner A. Ulice Brilliant, M.S., OTR/L Pager: [phone] 05/06/2017, 10:20 AM

## 2017-05-06 NOTE — Progress Notes (Signed)
PROGRESS NOTE  Cheryl Velasquez NTZ:[id number] DOB: 1940-06-05 DOA: 05/03/2017 PCP: [name], MD  HPI/Recap of past 34 hours: 77 year old female with PMH as below, which is significant for NASH cirrhosis, esophageal varices s/p banding,  HTN, HLD, DM, Breast CA s/p lumpectomy and radiation, diastolic CHF, and hypothyroidism. She has recently been diagnosed with small hepatocellular carcinoma s/p thermal ablation in January of 2018. She underwent MRCP in 01/2017. She underwent repeat thermal ablation 3/13 for residual HCC and was discharged the following day without complication. She did complain of some mild dyspnea post-procedurally, and was successfully treated with nebulized bronchodilators. Of note, her platelets at this time were 44.   She presented to [facility] 3/20 with complaints of SOB for one week (essentially since being discharged from [facility] after ablation).  05/06/17: seen and examined. No new complaints. Denies dyspnea or chest pain.  Assessment/Plan: Active Problems:   Respiratory failure (HCC)  Acute hypoxic respiratory failure most likely 2/2 to R large pleural effusion vs acute on chronic HFPEF  improving Post thoracentesis 1300cc bloody pleural fluid obtained O2 supplement as needed Currently on 2L O2 [place] Maintain O2 sat at 92% or greater  DM2, complicated by hyperglycemia ISS A1C 9.1 (04/20/17)  Hepatic cirrhosis with splenomegaly Suspect non alcoholic steatohepatitis US abdomen 05/05/17 revealed 1. Splenomegaly, though the splenic size may be underestimated sonographically. 2. Hepatic cirrhosis. Rounded 2 cm echogenic focus in the left lobe may be site of prior ablation. 3. Gallstones with mild gallbladder wall thickening, nonspecific in the setting of chronic liver disease. No sonographic Murphy sign or pericholecystic fluid.  Suspected delirium Responded well to seroquel  Thrombocytopenia plt 49k No sign of overtr bleeding  Leukopenia Wbc  2.1 Repeat CBC am  Chronic HFPEF Continue lasix Daily weight I&Os  GERD protonix    Code Status: Full  Family Communication: none at bedside  Disposition Plan: home with home health when clinically stable   Consultants:  CCM   Procedures:  Thermal ablation of HCC  Antimicrobials:  none  DVT prophylaxis:  SCDs   Objective: Vitals:   05/05/17 1600 05/05/17 1827 05/05/17 2110 05/06/17 0615  BP: (!) 144/71 (!) 146/71 (!) 114/56 (!) 119/58  Pulse: (!) 110 99 93 84  Resp: 20 20 20 17   Temp:  99.4 F (37.4 C) 98.8 F (37.1 C) 98.4 F (36.9 C)  TempSrc:  Axillary Oral Oral  SpO2: 95% 96% 96% 97%  Weight:    100.1 kg (220 lb 10.9 oz)  Height:        Intake/Output Summary (Last 24 hours) at 05/06/2017 1653 Last data filed at 05/06/2017 0831 Gross per 24 hour  Intake 220 ml  Output 120 ml  Net 100 ml   Filed Weights   05/04/17 0500 05/05/17 0500 05/06/17 0615  Weight: 107.2 kg (236 lb 5.3 oz) 100.6 kg (221 lb 12.5 oz) 100.1 kg (220 lb 10.9 oz)    Exam:   General:  77 yo CF WD WN NAD A&O x 3   Cardiovascular: RRR no rubs or gallops.  Respiratory: CTA no wheezes or rales   Abdomen: obese NT ND NBS x4   Musculoskeletal: Non focal  Skin: no rash  Psychiatry: Mood is appropriate    Data Reviewed: CBC: Recent Labs  Lab 05/03/17 2122 05/04/17 0423 05/05/17 0434 05/06/17 0645  WBC 4.8 4.4 2.7* 2.1*  NEUTROABS 3.4  --   --   --   HGB 13.0 14.0 12.8 12.9  HCT  38.7 42.3 39.8 38.2  MCV 91.7 92.8 92.3 92.7  PLT 52* 54* 45* 49*   Basic Metabolic Panel: Recent Labs  Lab 05/03/17 2122 05/04/17 0423 05/05/17 0434 05/06/17 0645  NA 138 142 144 138  K 3.5 3.6 3.0* 4.7  CL 98* 97* 97* 96*  CO2 27 30 32 28  GLUCOSE 192* 157* 125* 134*  BUN 12 12 13 13   CREATININE 0.88 0.83 0.87 0.87  CALCIUM 8.6* 8.7* 8.4* 7.8*  MG 1.7 2.0 2.0 1.9  PHOS 4.3 4.0 4.8* 4.3   GFR: Estimated Creatinine Clearance: 58 mL/min (by C-G formula based on SCr of  0.87 mg/dL). Liver Function Tests: Recent Labs  Lab 05/03/17 2122  AST 48*  ALT 34  ALKPHOS 76  BILITOT 1.4*  PROT 6.8  ALBUMIN 3.0*   No results for input(s): LIPASE, AMYLASE in the last 168 hours. No results for input(s): AMMONIA in the last 168 hours. Coagulation Profile: No results for input(s): INR, PROTIME in the last 168 hours. Cardiac Enzymes: No results for input(s): CKTOTAL, CKMB, CKMBINDEX, TROPONINI in the last 168 hours. BNP (last 3 results) No results for input(s): PROBNP in the last 8760 hours. HbA1C: No results for input(s): HGBA1C in the last 72 hours. CBG: Recent Labs  Lab 05/05/17 1530 05/05/17 1729 05/05/17 2155 05/06/17 0745 05/06/17 1207  GLUCAP 161* 179* 272* 145* 188*   Lipid Profile: No results for input(s): CHOL, HDL, LDLCALC, TRIG, CHOLHDL, LDLDIRECT in the last 72 hours. Thyroid Function Tests: No results for input(s): TSH, T4TOTAL, FREET4, T3FREE, THYROIDAB in the last 72 hours. Anemia Panel: No results for input(s): VITAMINB12, FOLATE, FERRITIN, TIBC, IRON, RETICCTPCT in the last 72 hours. Urine analysis: No results found for: COLORURINE, APPEARANCEUR, LABSPEC, PHURINE, GLUCOSEU, HGBUR, BILIRUBINUR, KETONESUR, PROTEINUR, UROBILINOGEN, NITRITE, LEUKOCYTESUR Sepsis Labs: @LABRCNTIP (procalcitonin:4,lacticidven:4)  ) Recent Results (from the past 240 hour(s))  Culture, blood (routine x 2)     Status: None (Preliminary result)   Collection Time: 05/03/17  9:30 PM  Result Value Ref Range Status   Specimen Description BLOOD LEFT ARM  Final   Special Requests   Final    BOTTLES DRAWN AEROBIC AND ANAEROBIC Blood Culture adequate volume   Culture   Final    NO GROWTH 3 DAYS Performed at [facility], [address], [place], [place] [address]    Report Status PENDING  Incomplete  Culture, blood (routine x 2)     Status: None (Preliminary result)   Collection Time: 05/03/17  9:35 PM  Result Value Ref Range Status   Specimen  Description BLOOD LEFT ARM  Final   Special Requests AEROBIC BOTTLE ONLY Blood Culture adequate volume  Final   Culture   Final    NO GROWTH 3 DAYS Performed at [facility], [address] [place], [place] [address]    Report Status PENDING  Incomplete  MRSA PCR Screening     Status: None   Collection Time: 05/04/17 12:23 AM  Result Value Ref Range Status   MRSA by PCR NEGATIVE NEGATIVE Final    Comment:        The GeneXpert MRSA Assay (FDA approved for NASAL specimens only), is one component of a comprehensive MRSA colonization surveillance program. It is not intended to diagnose MRSA infection nor to guide or monitor treatment for MRSA infections. Performed at [facility], [place] [address] [place], [place] [address]   Body fluid culture (includes gram stain)     Status: None (  Preliminary result)   Collection Time: 05/05/17 12:34 PM  Result Value Ref Range Status   Specimen Description FLUID  Final   Special Requests PLEURAL RIGHT  Final   Gram Stain   Final    RARE WBC PRESENT,BOTH PMN AND MONONUCLEAR NO ORGANISMS SEEN    Culture   Final    NO GROWTH < 24 HOURS Performed at [facility], [place] [address], [place], [place] [address]    Report Status PENDING  Incomplete      Studies: No results found.  Scheduled Meds: . aspirin EC  81 mg Oral Daily  . furosemide  20 mg Oral Daily  . insulin aspart  0-20 Units Subcutaneous TID WC  . insulin aspart  0-5 Units Subcutaneous QHS  . insulin aspart  6 Units Subcutaneous TID WC  . insulin glargine  30 Units Subcutaneous QHS  . pantoprazole  40 mg Oral Daily  . QUEtiapine  50 mg Oral QHS  . simvastatin  40 mg Oral QHS    Continuous Infusions: . sodium chloride       LOS: 3 days     Kayleen Memos, MD [facility] Pager [phone]  If 7PM-7AM, please contact night-coverage [email or web address] Password [facility] 05/06/2017, 4:53 PM

## 2017-05-06 NOTE — Plan of Care (Signed)
  Problem: Clinical Measurements: Goal: Diagnostic test results will improve Outcome: Progressing Goal: Respiratory complications will improve Outcome: Progressing   Problem: Safety: Goal: Ability to remain free from injury will improve Outcome: Progressing

## 2017-05-07 DIAGNOSIS — R601 Generalized edema: Secondary | ICD-10-CM

## 2017-05-07 DIAGNOSIS — E440 Moderate protein-calorie malnutrition: Secondary | ICD-10-CM

## 2017-05-07 LAB — GLUCOSE, CAPILLARY
GLUCOSE-CAPILLARY: 157 mg/dL — AB (ref 65–99)
GLUCOSE-CAPILLARY: 122 mg/dL — AB (ref 65–99)
GLUCOSE-CAPILLARY: 143 mg/dL — AB (ref 65–99)
Glucose-Capillary: 131 mg/dL — ABNORMAL HIGH (ref 65–99)

## 2017-05-07 LAB — BASIC METABOLIC PANEL
ANION GAP: 12 (ref 5–15)
BUN: 11 mg/dL (ref 6–20)
CO2: 31 mmol/L (ref 22–32)
Calcium: 8.1 mg/dL — ABNORMAL LOW (ref 8.9–10.3)
Chloride: 96 mmol/L — ABNORMAL LOW (ref 101–111)
Creatinine, Ser: 0.89 mg/dL (ref 0.44–1.00)
GFR calc Af Amer: >60 mL/min (ref >60)
Glucose, Bld: 106 mg/dL — ABNORMAL HIGH (ref 65–99)
POTASSIUM: 3.1 mmol/L — AB (ref 3.5–5.1)
SODIUM: 139 mmol/L (ref 135–145)

## 2017-05-07 LAB — CBC
HEMATOCRIT: 37.9 % (ref 36.0–46.0)
Hemoglobin: 12.6 g/dL (ref 12.0–15.0)
MCH: 30.3 pg (ref 26.0–34.0)
MCHC: 33.2 g/dL (ref 30.0–36.0)
MCV: 91.1 fL (ref 78.0–100.0)
Platelets: 36 10*3/uL — ABNORMAL LOW (ref 150–400)
RBC: 4.16 MIL/uL (ref 3.87–5.11)
RDW: 14.9 % (ref 11.5–15.5)
WBC: 2.3 10*3/uL — ABNORMAL LOW (ref 4.0–10.5)

## 2017-05-07 LAB — MAGNESIUM: MAGNESIUM: 1.8 mg/dL (ref 1.7–2.4)

## 2017-05-07 LAB — PHOSPHORUS: PHOSPHORUS: 4.1 mg/dL (ref 2.5–4.6)

## 2017-05-07 MED ORDER — FUROSEMIDE 10 MG/ML IJ SOLN
40.0000 mg | Freq: Once | INTRAMUSCULAR | Status: AC
  Administered 2017-05-07: 40 mg via INTRAVENOUS
  Filled 2017-05-07: qty 4

## 2017-05-07 NOTE — Progress Notes (Addendum)
PROGRESS NOTE  KEIGAN GIRTEN IPJ:[id number] DOB: 04-02-1940 DOA: 05/03/2017 PCP: [name], MD  HPI/Recap of past 71 hours: 77 year old female with PMH as below, which is significant for NASH cirrhosis, esophageal varices s/p banding,  HTN, HLD, DM, Breast CA s/p lumpectomy and radiation, diastolic CHF, and hypothyroidism. She has recently been diagnosed with small hepatocellular carcinoma s/p thermal ablation in January of 2018. She underwent MRCP in 01/2017. She underwent repeat thermal ablation 3/13 for residual HCC and was discharged the following day without complication. She did complain of some mild dyspnea post-procedurally, and was successfully treated with nebulized bronchodilators. Of note, her platelets at this time were 44.   She presented to [facility] 3/20 with complaints of SOB for one week (essentially since being discharged from [facility] after ablation).  05/06/17: seen and examined. No new complaints. Denies dyspnea or chest pain.  05/07/17: seen and examined. Complaints of legs and abdomen swelling. Given one dose of IV lasix once and continue po lasix 20 mg daily. Good renal function. Breathing is improving.  Assessment/Plan: Active Problems:   Respiratory failure (HCC)  Acute hypoxic respiratory failure most likely 2/2 to R large pleural effusion vs acute on chronic HFPEF  improving Post thoracentesis 1300cc bloody pleural fluid obtained Wean off O2 as tolerated Safe to dc telemetry  Mild hypervolemia 2/2 to third spacing from hypoalbuminemia vs chronic heart failure One dose IV lasix 40 mg given once Continue po lasix 20 mg daily Continue to monitor I&Os daily weights  DM2, complicated by hyperglycemia ISS A1C 9.1 (04/20/17)  Hepatic cirrhosis with splenomegaly Suspect non alcoholic steatohepatitis US abdomen 05/05/17 revealed 1. Splenomegaly, though the splenic size may be underestimated sonographically. 2. Hepatic cirrhosis. Rounded 2 cm echogenic  focus in the left lobe may be site of prior ablation. 3. Gallstones with mild gallbladder wall thickening, nonspecific in the setting of chronic liver disease. No sonographic Murphy sign or pericholecystic fluid.  Suspected delirium Responded well to seroquel  Thrombocytopenia plt 49k No sign of overtr bleeding SCDs for DVT ppx  Leukopenia Wbc 2.1 Repeat CBC am  Chronic HFPEF Continue lasix Daily weight I&Os  GERD protonix  Hypoalbuminemia 2/2 to liver failure Albumin 3.0 Monitor for anasarca  Morbid obesity Weight loss outpatient  Ambulatory dysfunction Home with home PT Continue PT Fall precaution   Code Status: Full  Family Communication: none at bedside  Disposition Plan: home with home health when clinically stable   Consultants:  CCM   Procedures:  Thermal ablation of HCC  Antimicrobials:  none  DVT prophylaxis:  SCDs   Objective: Vitals:   05/06/17 0615 05/06/17 1400 05/06/17 2100 05/07/17 0505  BP: (!) 119/58 (!) 165/69 (!) 150/77 (!) 150/68  Pulse: 84 98 (!) 109 (!) 111  Resp: 17 18 17 19   Temp: 98.4 F (36.9 C) 97.6 F (36.4 C) 98.3 F (36.8 C) 98.5 F (36.9 C)  TempSrc: Oral Oral Oral   SpO2: 97% 98% 97% 95%  Weight: 100.1 kg (220 lb 10.9 oz)     Height:        Intake/Output Summary (Last 24 hours) at 05/07/2017 1233 Last data filed at 05/06/2017 1700 Gross per 24 hour  Intake 300 ml  Output -  Net 300 ml   Filed Weights   05/04/17 0500 05/05/17 0500 05/06/17 0615  Weight: 107.2 kg (236 lb 5.3 oz) 100.6 kg (221 lb 12.5 oz) 100.1 kg (220 lb 10.9 oz)    Exam:   General:  77 yo CF pleasant Morbidly obese in NAD.   Cardiovascular: RRR no rubs or gallops  Respiratory: CTA no wheezes or rales   Abdomen: obese NT ND NBS x4   Musculoskeletal: Non focal. Trace bilateral LE edema.  Skin: no rash  Psychiatry: Mood is appropriate    Data Reviewed: CBC: Recent Labs  Lab 05/03/17 2122 05/04/17 0423  05/05/17 0434 05/06/17 0645 05/07/17 0519  WBC 4.8 4.4 2.7* 2.1* 2.3*  NEUTROABS 3.4  --   --   --   --   HGB 13.0 14.0 12.8 12.9 12.6  HCT 38.7 42.3 39.8 38.2 37.9  MCV 91.7 92.8 92.3 92.7 91.1  PLT 52* 54* 45* 49* 36*   Basic Metabolic Panel: Recent Labs  Lab 05/03/17 2122 05/04/17 0423 05/05/17 0434 05/06/17 0645 05/07/17 0519  NA 138 142 144 138 139  K 3.5 3.6 3.0* 4.7 3.1*  CL 98* 97* 97* 96* 96*  CO2 27 30 32 28 31  GLUCOSE 192* 157* 125* 134* 106*  BUN 12 12 13 13 11   CREATININE 0.88 0.83 0.87 0.87 0.89  CALCIUM 8.6* 8.7* 8.4* 7.8* 8.1*  MG 1.7 2.0 2.0 1.9 1.8  PHOS 4.3 4.0 4.8* 4.3 4.1   GFR: Estimated Creatinine Clearance: 56.7 mL/min (by C-G formula based on SCr of 0.89 mg/dL). Liver Function Tests: Recent Labs  Lab 05/03/17 2122  AST 48*  ALT 34  ALKPHOS 76  BILITOT 1.4*  PROT 6.8  ALBUMIN 3.0*   No results for input(s): LIPASE, AMYLASE in the last 168 hours. No results for input(s): AMMONIA in the last 168 hours. Coagulation Profile: No results for input(s): INR, PROTIME in the last 168 hours. Cardiac Enzymes: No results for input(s): CKTOTAL, CKMB, CKMBINDEX, TROPONINI in the last 168 hours. BNP (last 3 results) No results for input(s): PROBNP in the last 8760 hours. HbA1C: No results for input(s): HGBA1C in the last 72 hours. CBG: Recent Labs  Lab 05/06/17 0745 05/06/17 1207 05/06/17 1724 05/06/17 2128 05/07/17 0746  GLUCAP 145* 188* 120* 148* 131*   Lipid Profile: No results for input(s): CHOL, HDL, LDLCALC, TRIG, CHOLHDL, LDLDIRECT in the last 72 hours. Thyroid Function Tests: No results for input(s): TSH, T4TOTAL, FREET4, T3FREE, THYROIDAB in the last 72 hours. Anemia Panel: No results for input(s): VITAMINB12, FOLATE, FERRITIN, TIBC, IRON, RETICCTPCT in the last 72 hours. Urine analysis: No results found for: COLORURINE, APPEARANCEUR, LABSPEC, PHURINE, GLUCOSEU, HGBUR, BILIRUBINUR, KETONESUR, PROTEINUR, UROBILINOGEN, NITRITE,  LEUKOCYTESUR Sepsis Labs: @LABRCNTIP (procalcitonin:4,lacticidven:4)  ) Recent Results (from the past 240 hour(s))  Culture, blood (routine x 2)     Status: None (Preliminary result)   Collection Time: 05/03/17  9:30 PM  Result Value Ref Range Status   Specimen Description BLOOD LEFT ARM  Final   Special Requests   Final    BOTTLES DRAWN AEROBIC AND ANAEROBIC Blood Culture adequate volume   Culture   Final    NO GROWTH 3 DAYS Performed at [facility], [address], [place], [place] [address]    Report Status PENDING  Incomplete  Culture, blood (routine x 2)     Status: None (Preliminary result)   Collection Time: 05/03/17  9:35 PM  Result Value Ref Range Status   Specimen Description BLOOD LEFT ARM  Final   Special Requests AEROBIC BOTTLE ONLY Blood Culture adequate volume  Final   Culture   Final    NO GROWTH 3 DAYS Performed at [facility], [address], [place], [place]  [address]    Report Status PENDING  Incomplete  MRSA PCR Screening     Status: None   Collection Time: 05/04/17 12:23 AM  Result Value Ref Range Status   MRSA by PCR NEGATIVE NEGATIVE Final    Comment:        The GeneXpert MRSA Assay (FDA approved for NASAL specimens only), is one component of a comprehensive MRSA colonization surveillance program. It is not intended to diagnose MRSA infection nor to guide or monitor treatment for MRSA infections. Performed at [facility], [place] [address], [place], [place] [address]   Body fluid culture (includes gram stain)     Status: None (Preliminary result)   Collection Time: 05/05/17 12:34 PM  Result Value Ref Range Status   Specimen Description FLUID  Final   Special Requests PLEURAL RIGHT  Final   Gram Stain   Final    RARE WBC PRESENT,BOTH PMN AND MONONUCLEAR NO ORGANISMS SEEN    Culture   Final    NO GROWTH 2 DAYS Performed at [facility], [place] [address] [place], [place] [address]    Report Status PENDING  Incomplete       Studies: No results found.  Scheduled Meds: . aspirin EC  81 mg Oral Daily  . furosemide  40 mg Intravenous Once  . furosemide  20 mg Oral Daily  . insulin aspart  0-20 Units Subcutaneous TID WC  . insulin aspart  0-5 Units Subcutaneous QHS  . insulin aspart  6 Units Subcutaneous TID WC  . insulin glargine  30 Units Subcutaneous QHS  . pantoprazole  40 mg Oral Daily  . QUEtiapine  50 mg Oral QHS  . simvastatin  40 mg Oral QHS    Continuous Infusions: . sodium chloride       LOS: 4 days     Kayleen Memos, MD [facility] Pager [phone]  If 7PM-7AM, please contact night-coverage [email or web address] Password [facility] 05/07/2017, 12:33 PM

## 2017-05-08 ENCOUNTER — Telehealth (INDEPENDENT_AMBULATORY_CARE_PROVIDER_SITE_OTHER): Payer: Self-pay | Admitting: *Deleted

## 2017-05-08 DIAGNOSIS — E876 Hypokalemia: Secondary | ICD-10-CM

## 2017-05-08 DIAGNOSIS — E118 Type 2 diabetes mellitus with unspecified complications: Secondary | ICD-10-CM

## 2017-05-08 LAB — BASIC METABOLIC PANEL
ANION GAP: 13 (ref 5–15)
BUN: 10 mg/dL (ref 6–20)
CHLORIDE: 94 mmol/L — AB (ref 101–111)
CO2: 31 mmol/L (ref 22–32)
Calcium: 8.2 mg/dL — ABNORMAL LOW (ref 8.9–10.3)
Creatinine, Ser: 0.93 mg/dL (ref 0.44–1.00)
GFR calc non Af Amer: 57 mL/min — ABNORMAL LOW (ref >60)
Glucose, Bld: 85 mg/dL (ref 65–99)
POTASSIUM: 3.1 mmol/L — AB (ref 3.5–5.1)
Sodium: 138 mmol/L (ref 135–145)

## 2017-05-08 LAB — CULTURE, BLOOD (ROUTINE X 2)
CULTURE: NO GROWTH
Culture: NO GROWTH
SPECIAL REQUESTS: ADEQUATE
Special Requests: ADEQUATE

## 2017-05-08 LAB — CBC
HEMATOCRIT: 40.4 % (ref 36.0–46.0)
HEMOGLOBIN: 13.5 g/dL (ref 12.0–15.0)
MCH: 30.9 pg (ref 26.0–34.0)
MCHC: 33.4 g/dL (ref 30.0–36.0)
MCV: 92.4 fL (ref 78.0–100.0)
Platelets: 38 10*3/uL — ABNORMAL LOW (ref 150–400)
RBC: 4.37 MIL/uL (ref 3.87–5.11)
RDW: 15.2 % (ref 11.5–15.5)
WBC: 3.1 10*3/uL — ABNORMAL LOW (ref 4.0–10.5)

## 2017-05-08 LAB — GLUCOSE, CAPILLARY
GLUCOSE-CAPILLARY: 87 mg/dL (ref 65–99)
GLUCOSE-CAPILLARY: 152 mg/dL — AB (ref 65–99)
Glucose-Capillary: 149 mg/dL — ABNORMAL HIGH (ref 65–99)
Glucose-Capillary: 174 mg/dL — ABNORMAL HIGH (ref 65–99)

## 2017-05-08 LAB — PHOSPHORUS: PHOSPHORUS: 3.8 mg/dL (ref 2.5–4.6)

## 2017-05-08 LAB — MAGNESIUM: Magnesium: 1.8 mg/dL (ref 1.7–2.4)

## 2017-05-08 LAB — PATHOLOGIST SMEAR REVIEW

## 2017-05-08 MED ORDER — POTASSIUM CHLORIDE CRYS ER 20 MEQ PO TBCR
40.0000 meq | EXTENDED_RELEASE_TABLET | Freq: Two times a day (BID) | ORAL | Status: DC
  Administered 2017-05-08 – 2017-05-09 (×3): 40 meq via ORAL
  Filled 2017-05-08 (×3): qty 2

## 2017-05-08 MED ORDER — MAGNESIUM SULFATE 2 GM/50ML IV SOLN
2.0000 g | Freq: Once | INTRAVENOUS | Status: AC
Start: 2017-05-08 — End: 2017-05-08
  Administered 2017-05-08: 2 g via INTRAVENOUS
  Filled 2017-05-08: qty 50
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
## 2017-05-08 NOTE — Progress Notes (Signed)
Physical Therapy Treatment Patient Details Name: Cheryl Velasquez MRN: [id number] DOB: [birth year] Today's Date: 05/08/2017    History of Present Illness Pt is a 77 y.o. female presenting with c/o SOB for one week. PMHx: NASH cirrhosis, esophageal varices s/p banding, HTN, HLD, DM, Breast CA s/p lumpectomy and radiation, diastolic CHF, hypothyroidism, recent diagnosis of small hepatocellular carcinoma s/p thermal ablation 02/2016, MRCP 01/2017, and repeat thermal ablation 04/26/2017.    PT Comments    Patient continues to progress toward PT goals and SpO2 remained >90% on RA. Continue to progress as tolerated.    Follow Up Recommendations  Home health PT;Supervision/Assistance - 24 hour(24 hour supervision initially)     Equipment Recommendations  None recommended by PT    Recommendations for Other Services       Precautions / Restrictions Precautions Precautions: Fall;Other (comment) Precaution Comments: watch O2 Restrictions Weight Bearing Restrictions: No    Mobility  Bed Mobility               General bed mobility comments: pt OOB in chair upon arrival  Transfers Overall transfer level: Needs assistance Equipment used: Rolling walker (2 wheeled) Transfers: Sit to/from Stand Sit to Stand: Supervision         General transfer comment: for safety  Ambulation/Gait Ambulation/Gait assistance: Supervision Ambulation Distance (Feet): 200 Feet Assistive device: Rolling walker (2 wheeled) Gait Pattern/deviations: Step-through pattern;Decreased stride length Gait velocity: decreased   General Gait Details: cues for increased bilat step lengths; SpO2 >90% on RA    Stairs Stairs: Yes   Stair Management: One rail Right;Step to pattern;Forwards Number of Stairs: 2 General stair comments: cues for energy conservation techniques  Wheelchair Mobility    Modified Rankin (Stroke Patients Only)       Balance                                             Cognition Arousal/Alertness: Awake/alert Behavior During Therapy: WFL for tasks assessed/performed Overall Cognitive Status: Within Functional Limits for tasks assessed                                        Exercises      General Comments        Pertinent Vitals/Pain Pain Assessment: No/denies pain    Home Living                      Prior Function            PT Goals (current goals can now be found in the care plan section) Acute Rehab PT Goals Patient Stated Goal: return home PT Goal Formulation: With patient Time For Goal Achievement: 05/20/17 Potential to Achieve Goals: Good Progress towards PT goals: Progressing toward goals    Frequency    Min 3X/week      PT Plan Current plan remains appropriate    Co-evaluation              AM-PAC PT "6 Clicks" Daily Activity  Outcome Measure  Difficulty turning over in bed (including adjusting bedclothes, sheets and blankets)?: A Little Difficulty moving from lying on back to sitting on the side of the bed? : A Little Difficulty sitting down on and standing up from a chair with arms (e.g.,  wheelchair, bedside commode, etc,.)?: A Little Help needed moving to and from a bed to chair (including a wheelchair)?: A Little Help needed walking in hospital room?: A Little Help needed climbing 3-5 steps with a railing? : A Little 6 Click Score: 18    End of Session Equipment Utilized During Treatment: Gait belt Activity Tolerance: Patient tolerated treatment well Patient left: with call bell/phone within reach;in chair Nurse Communication: Mobility status PT Visit Diagnosis: Muscle weakness (generalized) (M62.81);Difficulty in walking, not elsewhere classified (R26.2)     Time: 9480-1655 PT Time Calculation (min) (ACUTE ONLY): 18 min  Charges:  $Gait Training: 8-22 mins                    G Codes:       Earney Navy, PTA Pager: [phone]     Darliss Cheney 05/08/2017, 4:50 PM

## 2017-05-08 NOTE — Progress Notes (Addendum)
Occupational Therapy Treatment Patient Details Name: Cheryl Velasquez MRN: [id number] DOB: 04-25-1940 Today's Date: 05/08/2017    History of present illness Pt is a 77 y.o. female presenting with c/o SOB for one week. PMHx: NASH cirrhosis, esophageal varices s/p banding, HTN, HLD, DM, Breast CA s/p lumpectomy and radiation, diastolic CHF, hypothyroidism, recent diagnosis of small hepatocellular carcinoma s/p thermal ablation 02/2016, MRCP 01/2017, and repeat thermal ablation 04/26/2017.   OT comments  Pt progressing towards OT goals. Pt completing room level functional mobility, grooming and toileting ADLs with overall MinGuard assist at RW level. SpO2 monitored and remaining >90% on RA during room level activity, though noted to have increased dyspnea with activity progression. Feel POC remains appropriate at this time. Will continue to follow acutely to progress pt towards established OT goals.     Follow Up Recommendations  Home health OT;Supervision/Assistance - 24 hour(24hr initially; may benefit from [facility] with [facility] if eligible)    Equipment Recommendations  3 in 1 bedside commode          Precautions / Restrictions Precautions Precautions: Fall;Other (comment) Precaution Comments: watch O2 Restrictions Weight Bearing Restrictions: No       Mobility Bed Mobility               General bed mobility comments: OOB in recliner   Transfers Overall transfer level: Needs assistance Equipment used: Rolling walker (2 wheeled) Transfers: Sit to/from Stand Sit to Stand: Min guard         General transfer comment: min guard for safety    Balance Overall balance assessment: Mild deficits observed, not formally tested                                         ADL either performed or assessed with clinical judgement   ADL Overall ADL's : Needs assistance/impaired     Grooming: Min guard;Standing;Wash/dry hands;Wash/dry face;Oral care                    Toilet Transfer: Min guard;Ambulation;Comfort height toilet;Grab bars;RW   Toileting- Water quality scientist and Hygiene: Min guard;Sit to/from stand       Functional mobility during ADLs: Min guard;Rolling walker General ADL Comments: SpO2 monitored with lowest reading 91% on RA; pt standing at sink to complete x3 grooming tasks and completing toileting; educated pt on energy conservation during ADL completion with pt verbalizing understanding;                       Cognition Arousal/Alertness: Awake/alert Behavior During Therapy: WFL for tasks assessed/performed Overall Cognitive Status: Within Functional Limits for tasks assessed                                                            Pertinent Vitals/ Pain       Pain Assessment: No/denies pain                                                          Frequency  Min 2X/week        Progress Toward Goals  OT Goals(current goals can now be found in the care plan section)  Progress towards OT goals: Progressing toward goals  Acute Rehab OT Goals Patient Stated Goal: return home OT Goal Formulation: With patient Time For Goal Achievement: 05/20/17 Potential to Achieve Goals: Good  Plan Discharge plan remains appropriate                    AM-PAC PT "6 Clicks" Daily Activity     Outcome Measure   Help from another person eating meals?: None Help from another person taking care of personal grooming?: A Little Help from another person toileting, which includes using toliet, bedpan, or urinal?: A Little Help from another person bathing (including washing, rinsing, drying)?: A Little Help from another person to put on and taking off regular upper body clothing?: A Little Help from another person to put on and taking off regular lower body clothing?: A Little 6 Click Score: 19    End of Session Equipment Utilized During Treatment: Rolling  walker;Gait belt  OT Visit Diagnosis: Unsteadiness on feet (R26.81);Muscle weakness (generalized) (M62.81)   Activity Tolerance Patient tolerated treatment well   Patient Left in chair;with call bell/phone within reach;with family/visitor present   Nurse Communication Mobility status        Time: 9024-0973 OT Time Calculation (min): 27 min  Charges: OT General Charges $OT Visit: 1 Visit OT Treatments $Self Care/Home Management : 23-37 mins  Lou Cal, OT Pager [phone] 05/08/2017    Raymondo Band 05/08/2017, 12:25 PM

## 2017-05-08 NOTE — Social Work (Addendum)
CSW called and left message with pt preferred rehav facility at [facility]. CSW awaits return call to begin discharge coordination.   5:11pm- CSW has received fax number from facility, will send pt clinicals to admissions [phone] for referral.  CSW continuing to follow.  Alexander Mt, [place] Work [phone]

## 2017-05-08 NOTE — Progress Notes (Signed)
SATURATION QUALIFICATIONS: (This note is used to comply with regulatory documentation for home oxygen)  Patient Saturations on Room Air at Res =96%  Patient Saturations on Room Air while Ambulating = 93%  Patient Saturations on0 Liters of oxygen while Ambulating = 93%  Please briefly explain why patient needs home oxygen:

## 2017-05-08 NOTE — NC FL2 (Signed)
[place] LEVEL OF CARE SCREENING TOOL     IDENTIFICATION  Patient Name: Cheryl Velasquez Birthdate: 1940-06-15 Sex: female Admission Date (Current Location): 05/03/2017  [facility] and [place] Number:      Facility and Address:  [facility], [place] [address] [place], [place] [address]      Provider Number: [id number]  Attending Physician Name and Address:  Kayleen Memos, DO  Relative Name and Phone Number:       Current Level of Care: Hospital Recommended Level of Care: [place] Prior Approval Number:    Date Approved/Denied:   PASRR Number:    Discharge Plan: SNF    Current Diagnoses: Patient Active Problem List   Diagnosis Date Noted  . Respiratory failure ([place]) 05/03/2017  . Hepatocellular carcinoma ([place]) 03/11/2016  . Esophageal varices in cirrhosis ([place]) 12/22/2015  . Other cirrhosis of liver ([place]) 12/22/2015  . Iron deficiency anemia due to chronic blood loss 10/04/2015  . Breast cancer of upper-outer quadrant of right female breast ([place]) 10/02/2015  . Liver mass   . Congestive splenomegaly 08/04/2014  . Esophageal varices ([place]) 11/20/2012  . Obesity 11/20/2012  . Leukopenia 09/14/2012  . Thrombocytopenia ([place]) 09/14/2012  . B12 deficiency 09/14/2012  . Cirrhosis, non-alcoholic ([place]) 79/03/4095  . Multinodular goiter 01/18/2011  . Arthritis   . Diabetes mellitus   . Hyperlipidemia   . Hypertension   . Hyperthyroidism 01/11/2011    Orientation RESPIRATION BLADDER Height & Weight     Self, Time, Situation, Place  Normal Continent Weight: 220 lb 10.9 oz (100.1 kg) Height:  5' 2"  (157.5 cm)  BEHAVIORAL SYMPTOMS/MOOD NEUROLOGICAL BOWEL NUTRITION STATUS      Continent Diet(see discharge summary)  AMBULATORY STATUS COMMUNICATION OF NEEDS Skin   Limited Assist Verbally Surgical wounds(R upper port/R back incision with bandaids)                       Personal Care Assistance Level of Assistance   Bathing, Feeding, Dressing Bathing Assistance: Limited assistance Feeding assistance: Independent Dressing Assistance: Limited assistance     Functional Limitations Info  Sight, Hearing, Speech Sight Info: Adequate Hearing Info: Adequate Speech Info: Adequate    SPECIAL CARE FACTORS FREQUENCY  PT (By licensed PT), OT (By licensed OT)     PT Frequency: 5x week OT Frequency: 5x week            Contractures Contractures Info: Not present    Additional Factors Info  Code Status, Allergies Code Status Info: Full Code Allergies Info: No Known Allergies           Current Medications (05/08/2017):  This is the current hospital active medication list Current Facility-Administered Medications  Medication Dose Route Frequency Provider Last Rate Last Dose  . 0.9 %  sodium chloride infusion  250 mL Intravenous PRN Kipp Brood, MD      . acetaminophen (TYLENOL) tablet 650 mg  650 mg Oral Q4H PRN Kipp Brood, MD      . aspirin EC tablet 81 mg  81 mg Oral Daily Agarwala, Ravi, MD   81 mg at 05/08/17 0926  . furosemide (LASIX) tablet 20 mg  20 mg Oral Daily Kipp Brood, MD   20 mg at 05/08/17 0926  . insulin aspart (novoLOG) injection 0-20 Units  0-20 Units Subcutaneous TID WC Kipp Brood, MD   3 Units at 05/08/17 1340  . insulin aspart (novoLOG) injection 0-5 Units  0-5 Units  Subcutaneous QHS Kipp Brood, MD   3 Units at 05/05/17 2159  . insulin aspart (novoLOG) injection 6 Units  6 Units Subcutaneous TID WC Kipp Brood, MD   6 Units at 05/08/17 1341  . insulin glargine (LANTUS) injection 30 Units  30 Units Subcutaneous QHS Kipp Brood, MD   30 Units at 05/07/17 2206  . pantoprazole (PROTONIX) EC tablet 40 mg  40 mg Oral Daily Kipp Brood, MD   40 mg at 05/08/17 0926  . potassium chloride SA (K-DUR,KLOR-CON) CR tablet 40 mEq  40 mEq Oral BID Irene Pap N, DO   40 mEq at 05/08/17 1148  . QUEtiapine (SEROQUEL) tablet 50 mg  50 mg Oral QHS Kipp Brood, MD   50  mg at 05/07/17 2205  . simvastatin (ZOCOR) tablet 40 mg  40 mg Oral QHS Kipp Brood, MD   40 mg at 05/07/17 2205  . sodium chloride flush (NS) 0.9 % injection 3 mL  3 mL Intravenous PRN Kipp Brood, MD         Discharge Medications: Please see discharge summary for a list of discharge medications.  Relevant Imaging Results:  Relevant Lab Results:   Additional Information SS# [place], [place]

## 2017-05-08 NOTE — Plan of Care (Signed)
  Problem: Clinical Measurements: Goal: Ability to maintain clinical measurements within normal limits will improve Outcome: Progressing Goal: Will remain free from infection Outcome: Progressing

## 2017-05-08 NOTE — Progress Notes (Signed)
PROGRESS NOTE  Cheryl Velasquez AJG:[id number] DOB: 08-25-1940 DOA: 05/03/2017 PCP: [name], MD  HPI/Recap of past 69 hours: 77 year old female with PMH as below, which is significant for NASH cirrhosis, esophageal varices s/p banding,  HTN, HLD, DM, Breast CA s/p lumpectomy and radiation, diastolic CHF, and hypothyroidism. She has recently been diagnosed with small hepatocellular carcinoma s/p thermal ablation in January of 2018. She underwent MRCP in 01/2017. She underwent repeat thermal ablation 3/13 for residual HCC and was discharged the following day without complication. She did complain of some mild dyspnea post-procedurally, and was successfully treated with nebulized bronchodilators. Of note, her platelets at this time were 44.   She presented to [facility] 3/20 with complaints of SOB for one week (essentially since being discharged from [facility] after ablation).  05/06/17: seen and examined. No new complaints. Denies dyspnea or chest pain.  05/07/17: seen and examined. Complaints of legs and abdomen swelling. Given one dose of IV lasix once and continue po lasix 20 mg daily. Good renal function. Breathing is improving.  05/08/17. Seen and examined with her niece at her bedside. Has no new complaints. Still feels weak and is open to continue physical therapy. Denies chest pain, dyspnea or palpitations.  Assessment/Plan: Active Problems:   Respiratory failure (HCC)  Acute hypoxic respiratory failure most likely 2/2 to R large pleural effusion vs acute on chronic HFPEF  improving Post thoracentesis 1300cc bloody pleural fluid obtained Wean off O2 as tolerated Safe to dc telemetry  Mild hypervolemia 2/2 to third spacing from hypoalbuminemia vs chronic heart failure One dose IV lasix 40 mg given once Continue po lasix 20 mg daily Continue to monitor I&Os daily weights  Physical debility complicated by ambulatory dysfunction and morbid obesity Continue physical  therapy Continue oral supplement Will need to lose some weight outpatient Will need to continue PT at short term rehab facility.  Hypokalemia/hypomagnesemia most likely 2/2 to diuretic use K+ 3.1, mag2+ 1.8 repleted with po kcl and IV mag Repeat chemistry panel am  DM2, complicated by hyperglycemia ISS A1C 9.1 (04/20/17)  Hepatic cirrhosis with splenomegaly Suspect non alcoholic steatohepatitis US abdomen 05/05/17 revealed 1. Splenomegaly, though the splenic size may be underestimated sonographically. 2. Hepatic cirrhosis. Rounded 2 cm echogenic focus in the left lobe may be site of prior ablation. 3. Gallstones with mild gallbladder wall thickening, nonspecific in the setting of chronic liver disease. No sonographic Murphy sign or pericholecystic fluid.  Suspected delirium, resolved Responded well to seroquel  Thrombocytopenia Platelets 38k from 49k No sign of overt bleeding SCDs for DVT ppx  Leukopenia, improving Wbc 3.1 from 2.1 Repeat CBC am  Chronic HFPEF Continue lasix Daily weight I&Os  GERD protonix  Hypoalbuminemia 2/2 to liver failure Albumin 3.0 Monitor for anasarca  Morbid obesity Weight loss outpatient  Ambulatory dysfunction Home with home PT Continue PT Fall precaution   Code Status: Full  Family Communication: none at bedside  Disposition Plan: home with home health when clinically stable   Consultants:  CCM   Procedures:  Thermal ablation of HCC  Antimicrobials:  none  DVT prophylaxis:  SCDs   Objective: Vitals:   05/08/17 0750 05/08/17 0755 05/08/17 0801 05/08/17 1414  BP:    (!) 139/59  Pulse:    100  Resp:    20  Temp:    98.6 F (37 C)  TempSrc:    Oral  SpO2: 98% 95% 97% 96%  Weight:      Height:  Intake/Output Summary (Last 24 hours) at 05/08/2017 1451 Last data filed at 05/08/2017 1300 Gross per 24 hour  Intake 670 ml  Output 650 ml  Net 20 ml   Filed Weights   05/04/17 0500 05/05/17 0500  05/06/17 0615  Weight: 107.2 kg (236 lb 5.3 oz) 100.6 kg (221 lb 12.5 oz) 100.1 kg (220 lb 10.9 oz)    Exam:05/08/17   General: 77 yo pleasant CF morbidly obese NAD   Cardiovascular: RRR no rubs or gallops  Respiratory:CTA no wheezes or rales  Abdomen: Morbidly obese NT ND NBS x 4  Musculoskeletal: No focal deficits. Trace LE edema.  Skin: no rash  Psychiatry: Mood is appropriate    Data Reviewed: CBC: Recent Labs  Lab 05/03/17 2122 05/04/17 0423 05/05/17 0434 05/06/17 0645 05/07/17 0519 05/08/17 0714  WBC 4.8 4.4 2.7* 2.1* 2.3* 3.1*  NEUTROABS 3.4  --   --   --   --   --   HGB 13.0 14.0 12.8 12.9 12.6 13.5  HCT 38.7 42.3 39.8 38.2 37.9 40.4  MCV 91.7 92.8 92.3 92.7 91.1 92.4  PLT 52* 54* 45* 49* 36* 38*   Basic Metabolic Panel: Recent Labs  Lab 05/04/17 0423 05/05/17 0434 05/06/17 0645 05/07/17 0519 05/08/17 0714  NA 142 144 138 139 138  K 3.6 3.0* 4.7 3.1* 3.1*  CL 97* 97* 96* 96* 94*  CO2 30 32 28 31 31   GLUCOSE 157* 125* 134* 106* 85  BUN 12 13 13 11 10   CREATININE 0.83 0.87 0.87 0.89 0.93  CALCIUM 8.7* 8.4* 7.8* 8.1* 8.2*  MG 2.0 2.0 1.9 1.8 1.8  PHOS 4.0 4.8* 4.3 4.1 3.8   GFR: Estimated Creatinine Clearance: 54.3 mL/min (by C-G formula based on SCr of 0.93 mg/dL). Liver Function Tests: Recent Labs  Lab 05/03/17 2122  AST 48*  ALT 34  ALKPHOS 76  BILITOT 1.4*  PROT 6.8  ALBUMIN 3.0*   No results for input(s): LIPASE, AMYLASE in the last 168 hours. No results for input(s): AMMONIA in the last 168 hours. Coagulation Profile: No results for input(s): INR, PROTIME in the last 168 hours. Cardiac Enzymes: No results for input(s): CKTOTAL, CKMB, CKMBINDEX, TROPONINI in the last 168 hours. BNP (last 3 results) No results for input(s): PROBNP in the last 8760 hours. HbA1C: No results for input(s): HGBA1C in the last 72 hours. CBG: Recent Labs  Lab 05/07/17 1232 05/07/17 1654 05/07/17 2103 05/08/17 0743 05/08/17 1138  GLUCAP 157*  122* 143* 87 152*   Lipid Profile: No results for input(s): CHOL, HDL, LDLCALC, TRIG, CHOLHDL, LDLDIRECT in the last 72 hours. Thyroid Function Tests: No results for input(s): TSH, T4TOTAL, FREET4, T3FREE, THYROIDAB in the last 72 hours. Anemia Panel: No results for input(s): VITAMINB12, FOLATE, FERRITIN, TIBC, IRON, RETICCTPCT in the last 72 hours. Urine analysis: No results found for: COLORURINE, APPEARANCEUR, LABSPEC, PHURINE, GLUCOSEU, HGBUR, BILIRUBINUR, KETONESUR, PROTEINUR, UROBILINOGEN, NITRITE, LEUKOCYTESUR Sepsis Labs: @LABRCNTIP (procalcitonin:4,lacticidven:4)  ) Recent Results (from the past 240 hour(s))  Culture, blood (routine x 2)     Status: None   Collection Time: 05/03/17  9:30 PM  Result Value Ref Range Status   Specimen Description BLOOD LEFT ARM  Final   Special Requests   Final    BOTTLES DRAWN AEROBIC AND ANAEROBIC Blood Culture adequate volume   Culture   Final    NO GROWTH 5 DAYS Performed at [facility], [address] [place], [place] [address]    Report Status 05/08/2017 FINAL  Final  Culture, blood (routine x 2)     Status: None   Collection Time: 05/03/17  9:35 PM  Result Value Ref Range Status   Specimen Description BLOOD LEFT ARM  Final   Special Requests AEROBIC BOTTLE ONLY Blood Culture adequate volume  Final   Culture   Final    NO GROWTH 5 DAYS Performed at [facility], [address], [place], [place] [address]    Report Status 05/08/2017 FINAL  Final  MRSA PCR Screening     Status: None   Collection Time: 05/04/17 12:23 AM  Result Value Ref Range Status   MRSA by PCR NEGATIVE NEGATIVE Final    Comment:        The GeneXpert MRSA Assay (FDA approved for NASAL specimens only), is one component of a comprehensive MRSA colonization surveillance program. It is not intended to diagnose MRSA infection nor to guide or monitor treatment for MRSA infections. Performed at [facility], [place] [address], [place], [place]  [address]   Body fluid culture (includes gram stain)     Status: None (Preliminary result)   Collection Time: 05/05/17 12:34 PM  Result Value Ref Range Status   Specimen Description FLUID  Final   Special Requests PLEURAL RIGHT  Final   Gram Stain   Final    RARE WBC PRESENT,BOTH PMN AND MONONUCLEAR NO ORGANISMS SEEN    Culture   Final    NO GROWTH 3 DAYS Performed at [facility], [place] [address], [place], [place] [address]    Report Status PENDING  Incomplete      Studies: No results found.  Scheduled Meds: . aspirin EC  81 mg Oral Daily  . furosemide  20 mg Oral Daily  . insulin aspart  0-20 Units Subcutaneous TID WC  . insulin aspart  0-5 Units Subcutaneous QHS  . insulin aspart  6 Units Subcutaneous TID WC  . insulin glargine  30 Units Subcutaneous QHS  . pantoprazole  40 mg Oral Daily  . potassium chloride  40 mEq Oral BID  . QUEtiapine  50 mg Oral QHS  . simvastatin  40 mg Oral QHS    Continuous Infusions: . sodium chloride       LOS: 5 days     Kayleen Memos, MD [facility] Pager [phone]  If 7PM-7AM, please contact night-coverage [email or web address] [facility] 05/08/2017, 2:51 PM

## 2017-05-08 NOTE — Telephone Encounter (Signed)
Patient's niece called and states that patient is currently a patient in [facility] , she has been there since last Wednesday and was in ICU until Friday. She was taken to [facility] first where she was a patient in  the ED for 2 days, lungs full of fluid.  Patient may be discharged today and will go to Rehabilitation for 21 days to regain strength.  Ms. Julien Girt felt you would appreciate knowing this and would like for you to call her if you would like too. She thought that she had appointment in April but it is in August. Niece feels that this is good so that she can regain some strength.

## 2017-05-09 DIAGNOSIS — R0600 Dyspnea, unspecified: Secondary | ICD-10-CM | POA: Diagnosis not present

## 2017-05-09 DIAGNOSIS — I8500 Esophageal varices without bleeding: Secondary | ICD-10-CM | POA: Diagnosis not present

## 2017-05-09 DIAGNOSIS — J90 Pleural effusion, not elsewhere classified: Secondary | ICD-10-CM | POA: Diagnosis not present

## 2017-05-09 DIAGNOSIS — J918 Pleural effusion in other conditions classified elsewhere: Secondary | ICD-10-CM | POA: Diagnosis not present

## 2017-05-09 DIAGNOSIS — K7581 Nonalcoholic steatohepatitis (NASH): Secondary | ICD-10-CM | POA: Diagnosis not present

## 2017-05-09 DIAGNOSIS — J9601 Acute respiratory failure with hypoxia: Secondary | ICD-10-CM | POA: Diagnosis not present

## 2017-05-09 DIAGNOSIS — R0689 Other abnormalities of breathing: Secondary | ICD-10-CM | POA: Diagnosis not present

## 2017-05-09 DIAGNOSIS — I10 Essential (primary) hypertension: Secondary | ICD-10-CM | POA: Diagnosis not present

## 2017-05-09 DIAGNOSIS — R0902 Hypoxemia: Secondary | ICD-10-CM | POA: Diagnosis not present

## 2017-05-09 DIAGNOSIS — Z853 Personal history of malignant neoplasm of breast: Secondary | ICD-10-CM | POA: Diagnosis not present

## 2017-05-09 DIAGNOSIS — K7469 Other cirrhosis of liver: Secondary | ICD-10-CM | POA: Diagnosis not present

## 2017-05-09 DIAGNOSIS — E785 Hyperlipidemia, unspecified: Secondary | ICD-10-CM | POA: Diagnosis not present

## 2017-05-09 DIAGNOSIS — I4891 Unspecified atrial fibrillation: Secondary | ICD-10-CM | POA: Diagnosis not present

## 2017-05-09 DIAGNOSIS — Z923 Personal history of irradiation: Secondary | ICD-10-CM | POA: Diagnosis not present

## 2017-05-09 DIAGNOSIS — K7460 Unspecified cirrhosis of liver: Secondary | ICD-10-CM | POA: Diagnosis not present

## 2017-05-09 DIAGNOSIS — R609 Edema, unspecified: Secondary | ICD-10-CM | POA: Diagnosis not present

## 2017-05-09 DIAGNOSIS — J9621 Acute and chronic respiratory failure with hypoxia: Secondary | ICD-10-CM | POA: Diagnosis not present

## 2017-05-09 DIAGNOSIS — E871 Hypo-osmolality and hyponatremia: Secondary | ICD-10-CM | POA: Diagnosis not present

## 2017-05-09 DIAGNOSIS — D696 Thrombocytopenia, unspecified: Secondary | ICD-10-CM | POA: Diagnosis not present

## 2017-05-09 DIAGNOSIS — E039 Hypothyroidism, unspecified: Secondary | ICD-10-CM | POA: Diagnosis not present

## 2017-05-09 LAB — BODY FLUID CULTURE: CULTURE: NO GROWTH

## 2017-05-09 LAB — GLUCOSE, CAPILLARY
GLUCOSE-CAPILLARY: 68 mg/dL (ref 65–99)
GLUCOSE-CAPILLARY: 142 mg/dL — AB (ref 65–99)
GLUCOSE-CAPILLARY: 314 mg/dL — AB (ref 65–99)

## 2017-05-09 LAB — CBC
HCT: 40.9 % (ref 36.0–46.0)
Hemoglobin: 13.5 g/dL (ref 12.0–15.0)
MCH: 30.4 pg (ref 26.0–34.0)
MCHC: 33 g/dL (ref 30.0–36.0)
MCV: 92.1 fL (ref 78.0–100.0)
PLATELETS: 49 10*3/uL — AB (ref 150–400)
RBC: 4.44 MIL/uL (ref 3.87–5.11)
RDW: 15.1 % (ref 11.5–15.5)
WBC: 3.5 10*3/uL — ABNORMAL LOW (ref 4.0–10.5)

## 2017-05-09 LAB — MAGNESIUM: MAGNESIUM: 1.9 mg/dL (ref 1.7–2.4)

## 2017-05-09 LAB — PHOSPHORUS: Phosphorus: 3.1 mg/dL (ref 2.5–4.6)

## 2017-05-09 MED ORDER — SIMVASTATIN 40 MG PO TABS: 40.0000 mg | ORAL_TABLET | Freq: Every day | ORAL | 0 refills | Status: DC

## 2017-05-09 MED ORDER — ASPIRIN 81 MG PO TBEC: 81.0000 mg | DELAYED_RELEASE_TABLET | Freq: Every day | ORAL | 0 refills | Status: DC

## 2017-05-09 MED ORDER — INSULIN ASPART 100 UNIT/ML ~~LOC~~ SOLN: 5.0000 [IU] | Freq: Three times a day (TID) | SUBCUTANEOUS | 0 refills | Status: DC

## 2017-05-09 MED ORDER — QUETIAPINE FUMARATE 50 MG PO TABS: 50.0000 mg | ORAL_TABLET | Freq: Every day | ORAL | 0 refills | Status: DC

## 2017-05-09 MED ORDER — INSULIN GLARGINE 100 UNIT/ML ~~LOC~~ SOLN
20.0000 [IU] | Freq: Every day | SUBCUTANEOUS | Status: DC
  Filled 2017-05-09: qty 0.2

## 2017-05-09 MED ORDER — INSULIN GLARGINE 100 UNIT/ML ~~LOC~~ SOLN: 20.0000 [IU] | Freq: Every day | SUBCUTANEOUS | 0 refills | Status: DC

## 2017-05-09 MED ORDER — PROPRANOLOL HCL 10 MG PO TABS: 20.0000 mg | ORAL_TABLET | Freq: Two times a day (BID) | ORAL | 0 refills | Status: DC

## 2017-05-09 MED ORDER — FUROSEMIDE 20 MG PO TABS: 20.0000 mg | ORAL_TABLET | Freq: Every day | ORAL | 0 refills | Status: DC

## 2017-05-09 NOTE — Progress Notes (Signed)
Inpatient Diabetes Program Recommendations  AACE/ADA: New Consensus Statement on Inpatient Glycemic Control (2015)  Target Ranges:  Prepandial:   less than 140 mg/dL      Peak postprandial:   less than 180 mg/dL (1-2 hours)      Critically ill patients:  140 - 180 mg/dL   Results for ALEZA, PEW (MRN [id number]) as of 05/09/2017 09:46  Ref. Range 05/09/2017 07:43  Glucose-Capillary Latest Ref Range: 65 - 99 mg/dL 68     Home DM Meds: Glipizide XL 5 mg daily        Metformin 500 mg BID (pt varies dose based on stool consistency)  Current Orders: Lantus 30 units QHS      Novolog Resistant Correction Scale/ SSI (0-20 units) TID AC + HS      Novolog 6 units TID with meals       MD- Note patient with mild Hypoglycemia this AM (CBG 68 mg/dl this AM).  Please consider reducing Lantus to 25 units QHS     --Will follow patient during hospitalization--  Wyn Quaker RN, MSN, CDE Diabetes Coordinator Inpatient Glycemic Control Team Team Pager: [phone] (8a-5p)

## 2017-05-09 NOTE — Telephone Encounter (Signed)
Call returned. Patient was discharged from [facility] today and she is at the rehab center in [place]. He is to follow with Dr. Kathlene Cote next month. We will wait for Dr. Margaretmary Dys evaluation.

## 2017-05-09 NOTE — Clinical Social Work Placement (Signed)
   CLINICAL SOCIAL WORK PLACEMENT  NOTE [facility]. RN to call [phone] report  prior to discharge.  Date:  05/09/2017  Patient Details  Name: Cheryl Velasquez MRN: [id number] Date of Birth: 1940/08/07  Clinical Social Work is seeking post-discharge placement for this patient at the [place] level of care (*CSW will initial, date and re-position this form in  chart as items are completed):  Yes   Patient/family provided with [facility] Work Department's list of facilities offering this level of care within the geographic area requested by the patient (or if unable, by the patient's family).  Yes   Patient/family informed of their freedom to choose among providers that offer the needed level of care, that participate in Medicare, Medicaid or managed care program needed by the patient, have an available bed and are willing to accept the patient.  Yes   Patient/family informed of [facility]'s ownership interest in [facility] and [facility], as well as of the fact that they are under no obligation to receive care at these facilities.  PASRR submitted to EDS on       PASRR number received on       Existing PASRR number confirmed on       FL2 transmitted to all facilities in geographic area requested by pt/family on 05/08/17     FL2 transmitted to all facilities within larger geographic area on       Patient informed that his/her managed care company has contracts with or will negotiate with certain facilities, including the following:        Yes   Patient/family informed of bed offers received.  Patient chooses bed at Other - please specify in the comment section below:([facility])     Physician recommends and patient chooses bed at      Patient to be transferred to Other - please specify in the comment section below:([facility]) on 05/09/17.  Patient to be transferred to facility by Liborio Nixon     Patient family  notified on 05/09/17 of transfer.  Name of family member notified:  neice, Cheryl Velasquez     PHYSICIAN       Additional Comment:    _______________________________________________ Alexander Mt, [place] 05/09/2017, 4:25 PM

## 2017-05-09 NOTE — Care Management Important Message (Signed)
Important Message  Patient Details  Name: Cheryl Velasquez MRN: [id number] Date of Birth: Jun 18, 1940   Medicare Important Message Given:  Yes    Ruston Fedora 05/09/2017, 9:22 AM

## 2017-05-09 NOTE — Discharge Instructions (Addendum)
Heart Failure Heart failure means your heart has trouble pumping blood. This makes it hard for your body to work well. Heart failure is usually a long-term (chronic) condition. You must take good care of yourself and follow your doctor's treatment plan. Follow these instructions at home:  Take your heart medicine as told by your doctor. ? Do not stop taking medicine unless your doctor tells you to. ? Do not skip any dose of medicine. ? Refill your medicines before they run out. ? Take other medicines only as told by your doctor or pharmacist.  Stay active if told by your doctor. The elderly and people with severe heart failure should talk with a doctor about physical activity.  Eat heart-healthy foods. Choose foods that are without trans fat and are low in saturated fat, cholesterol, and salt (sodium). This includes fresh or frozen fruits and vegetables, fish, lean meats, fat-free or low-fat dairy foods, whole grains, and high-fiber foods. Lentils and dried peas and beans (legumes) are also good choices.  Limit salt if told by your doctor.  Cook in a healthy way. Roast, grill, broil, bake, poach, steam, or stir-fry foods.  Limit fluids as told by your doctor.  Weigh yourself every morning. Do this after you pee (urinate) and before you eat breakfast. Write down your weight to give to your doctor.  Take your blood pressure and write it down if your doctor tells you to.  Ask your doctor how to check your pulse. Check your pulse as told.  Lose weight if told by your doctor.  Stop smoking or chewing tobacco. Do not use gum or patches that help you quit without your doctor's approval.  Schedule and go to doctor visits as told.  Nonpregnant women should have no more than 1 drink a day. Men should have no more than 2 drinks a day. Talk to your doctor about drinking alcohol.  Stop illegal drug use.  Stay current with shots (immunizations).  Manage your health conditions as told by  your doctor.  Learn to manage your stress.  Rest when you are tired.  If it is really hot outside: ? Avoid intense activities. ? Use air conditioning or fans, or get in a cooler place. ? Avoid caffeine and alcohol. ? Wear loose-fitting, lightweight, and light-colored clothing.  If it is really cold outside: ? Avoid intense activities. ? Layer your clothing. ? Wear mittens or gloves, a hat, and a scarf when going outside. ? Avoid alcohol.  Learn about heart failure and get support as needed.  Get help to maintain or improve your quality of life and your ability to care for yourself as needed. Contact a doctor if:  You gain weight quickly.  You are more short of breath than usual.  You cannot do your normal activities.  You tire easily.  You cough more than normal, especially with activity.  You have any or more puffiness (swelling) in areas such as your hands, feet, ankles, or belly (abdomen).  You cannot sleep because it is hard to breathe.  You feel like your heart is beating fast (palpitations).  You get dizzy or light-headed when you stand up. Get help right away if:  You have trouble breathing.  There is a change in mental status, such as becoming less alert or not being able to focus.  You have chest pain or discomfort.  You faint. This information is not intended to replace advice given to you by your health care provider. Make sure  you discuss any questions you have with your health care provider. Document Released: 11/10/2007 Document Revised: 07/09/2015 Document Reviewed: 03/19/2012 Elsevier Interactive Patient Education  2017 Elsevier Inc. Thrombocytopenia Thrombocytopenia means that you have a low number of platelets in your blood. Platelets are tiny cells in the blood. When you bleed, they clump together at the cut or injury to stop the bleeding. This is called blood clotting. Not having enough platelets can cause bleeding problems. Follow these  instructions at home: General instructions  Check your skin and inside your mouth for bruises or blood as told by your doctor.  Check to see if there is blood in your spit (sputum), pee (urine), and poop (stool). Do this as told by your doctor.  Ask your doctor if you can drink alcohol.  Take over-the-counter and prescription medicines only as told by your doctor.  Tell all of your doctors that you have this condition. Be sure to tell your dentist and eye doctor too. Activity  Do not do activities that can cause bumps or bruises until your doctor says it is okay.  Be careful not to cut yourself: ? When you shave. ? When you use scissors, needles, knives, or other tools.  Be careful not to burn yourself: ? When you use an iron. ? When you cook. Contact a doctor if:  You have bruises and you do not know why. Get help right away if:  You are bleeding anywhere on your body.  You have blood in your spit, pee, or poop. This information is not intended to replace advice given to you by your health care provider. Make sure you discuss any questions you have with your health care provider. Document Released: 01/20/2011 Document Revised: 10/04/2015 Document Reviewed: 08/04/2014 Elsevier Interactive Patient Education  [employer].

## 2017-05-09 NOTE — Social Work (Addendum)
CSW faxed pt clinicals to secure admissions fax on 3/25- await response from [facility] in [place] regarding if they can accept pt. Pt niece will transport.   2:07pm- CSW has left voicemails x3 on admissions voicemail.    Clinical Social Worker facilitated patient discharge including contacting patient family and facility to confirm patient discharge plans.  Clinical information faxed to facility and family agreeable with plan.  Pt niece to transport in personal vehicle to [facility].  RN to call [phone] report  prior to discharge.  Clinical Social Worker will sign off for now as social work intervention is no longer needed. Please consult [place] again if new need arises.  Alexander Mt, [place] Work [phone]

## 2017-05-09 NOTE — Progress Notes (Signed)
Discharge to [facility], transported by family. Patient was alert and oriented, not in any distress. Report given to Rito Ehrlich, RN

## 2017-05-09 NOTE — Discharge Summary (Signed)
Discharge Summary  Cheryl Velasquez CBU:[id number] DOB: 11-20-1940  PCP: [name], MD  Admit date: 05/03/2017 Discharge date: 05/09/2017  Time spent: 25 minutes   Recommendations for Outpatient Follow-up:  1. Follow up with PCP 2. Follow up with oncology 3. Take your medications as prescribed 4. Continue PT at SNF 5. Fall precautions  Discharge Diagnoses:  Active Hospital Problems   Diagnosis Date Noted  . Respiratory failure ([place]) 05/03/2017    Resolved Hospital Problems  No resolved problems to display.    Discharge Condition: stable  Diet recommendation: resume previous diet  Vitals:   05/08/17 2026 05/09/17 0527  BP: 138/67 (!) 135/54  Pulse: 97 100  Resp: 18 18  Temp: 97.7 F (36.5 C) 98 F (36.7 C)  SpO2: 97% 96%    History of present illness:  77 year old female with PMH as below, which is significant for NASH cirrhosis, esophageal varices s/p banding, HTN, HLD, DM, Breast CA s/p lumpectomy and radiation, diastolic CHF, and hypothyroidism. She has recently been diagnosed with small hepatocellular carcinoma s/p thermal ablation in January of 2018. She underwent MRCP in 01/2017. She underwent repeat thermal ablation 3/13 for residual HCC and was discharged the following day without complication. She did complain of some mild dyspnea post-procedurally, and was successfully treated with nebulized bronchodilators. Of note, her platelets at this time were 44.   She presented to [facility] [place] 3/20 with complaints of SOB for one week (essentially since being discharged from [facility] after ablation).  05/06/17: seen and examined. No new complaints. Denies dyspnea or chest pain.  05/07/17: seen and examined. Complaints of legs and abdomen swelling. Given one dose of IV lasix once and continue po lasix 20 mg daily. Good renal function. Breathing is improving.  05/08/17. Seen and examined with her niece at her bedside. Has no new complaints. Still feels weak and is  open to continue physical therapy. Denies chest pain, dyspnea or palpitations.  05/09/17: seen and examined at her bedside. She is alert and oriented x 3. No events overnight. Mild hypoglycemia this am with accu check 68. Insulin reduced to 20 U qhs from 30 u qhs. Patient states she is feeling better and is looking forward to continuing physical therapy at short term rehab so she can regain her strength and independence. No new complaints.  On the day of discharge, the patient was hemodynamically stable. She will need to follow up with her PCP, cardiologist, and oncologist post hospitalization.  Hospital Course:  Active Problems:   Respiratory failure (HCC)  Acute hypoxic respiratory failure most likely 2/2 to R large pleural effusion vs acute on chronic HFPEF  improving Post thoracentesis 1300cc bloody pleural fluid obtained Wean off O2 as tolerated Safe to dc telemetry Sats of 97% on RA  Mild hypervolemia 2/2 to third spacing from hypoalbuminemia vs chronic heart failure One dose IV lasix 40 mg given once Continue po lasix 20 mg daily  Physical debility complicated by ambulatory dysfunction and morbid obesity Continue physical therapy Continue oral supplement Will need to lose some weight outpatient Will need to continue PT at short term rehab facility.  Hypokalemia/hypomagnesemia most likely 2/2 to diuretic use K+ 3.1, mag2+ 1.8 repleted with po kcl and IV mag  DM2, complicated by hyperglycemia ISS A1C 9.1 (04/20/17) Continue lantus 20 U qhs  Continue novolog 5U tid Stopped metformin and glipizide due to liver failure  Hepatic cirrhosis with splenomegaly Suspect non alcoholic steatohepatitis US abdomen 05/05/17 revealed 1. Splenomegaly, though the  splenic size may be underestimated sonographically. 2. Hepatic cirrhosis. Rounded 2 cm echogenic focus in the left lobe may be site of prior ablation. 3. Gallstones with mild gallbladder wall thickening, nonspecific in the  setting of chronic liver disease. No sonographic Murphy sign or pericholecystic fluid.  Suspected delirium, resolved Responded well to seroquel continue seroquel  Thrombocytopenia Platelets 38k from 49k No sign of overt bleeding SCDs for DVT ppx  Leukopenia, improving Wbc 3.1 from 2.1 Repeat CBC am  Chronic HFPEF Continue lasix Daily weight I&Os  GERD protonix  Hypoalbuminemia 2/2 to liver failure Albumin 3.0 Monitor for anasarca  Morbid obesity Weight loss outpatient  Ambulatory dysfunction Home with home PT Continue PT Fall precaution    Procedures:  Thermal ablation of [place]  Consultations:  CCM  Discharge Exam: BP (!) 135/54 (BP Location: Left Arm)   Pulse 100   Temp 98 F (36.7 C) (Oral)   Resp 18   Ht 5' 2"  (1.575 m)   Wt 100.1 kg (220 lb 10.9 oz)   SpO2 96%   BMI 40.36 kg/m   General: 77 yo CF morbidly obese NAD A&O x 3  Cardiovascular: RRR no rubs or gallops Respiratory: CTA no wheezes or rales   Discharge Instructions You were cared for by a hospitalist during your hospital stay. If you have any questions about your discharge medications or the care you received while you were in the hospital after you are discharged, you can call the unit and asked to speak with the hospitalist on call if the hospitalist that took care of you is not available. Once you are discharged, your primary care physician will handle any further medical issues. Please note that NO REFILLS for any discharge medications will be authorized once you are discharged, as it is imperative that you return to your primary care physician (or establish a relationship with a primary care physician if you do not have one) for your aftercare needs so that they can reassess your need for medications and monitor your lab values.   Allergies as of 05/09/2017   No Known Allergies     Medication List    STOP taking these medications   ALPRAZolam 0.5 MG tablet Commonly known  as:  XANAX   captopril 25 MG tablet Commonly known as:  CAPOTEN   glipiZIDE 5 MG 24 hr tablet Commonly known as:  GLUCOTROL XL   metFORMIN 500 MG 24 hr tablet Commonly known as:  GLUCOPHAGE-XR   PROAIR HFA 108 (90 Base) MCG/ACT inhaler Generic drug:  albuterol   sodium chloride 0.65 % Soln nasal spray Commonly known as:  OCEAN     TAKE these medications   aspirin 81 MG EC tablet Take 1 tablet (81 mg total) by mouth daily. Start taking on:  05/10/2017   CALTRATE 600+D 600-400 MG-UNIT tablet Generic drug:  Calcium Carbonate-Vitamin D Take 1 tablet by mouth daily.   furosemide 20 MG tablet Commonly known as:  LASIX Take 1 tablet (20 mg total) by mouth daily. Start taking on:  05/10/2017 What changed:    when to take this  reasons to take this   hydroxypropyl methylcellulose / hypromellose 2.5 % ophthalmic solution Commonly known as:  ISOPTO TEARS / GONIOVISC Place 1 drop into both eyes 3 (three) times daily as needed for dry eyes.   insulin aspart 100 UNIT/ML injection Commonly known as:  novoLOG Inject 5 Units into the skin 3 (three) times daily with meals.   insulin glargine 100 UNIT/ML injection  Commonly known as:  LANTUS Inject 0.2 mLs (20 Units total) into the skin at bedtime.   multivitamin with minerals Tabs tablet Take 1 tablet by mouth daily. CENTRUM SILVER   pantoprazole 40 MG tablet Commonly known as:  PROTONIX TAKE 1 TABLET BY MOUTH EVERY DAY What changed:    how much to take  how to take this  when to take this   propranolol 10 MG tablet Commonly known as:  INDERAL Take 2 tablets (20 mg total) by mouth 2 (two) times daily. What changed:  medication strength   QUEtiapine 50 MG tablet Commonly known as:  SEROQUEL Take 1 tablet (50 mg total) by mouth at bedtime.   simvastatin 40 MG tablet Commonly known as:  ZOCOR Take 1 tablet (40 mg total) by mouth at bedtime.      No Known Allergies Follow-up Information    [name], MD  Follow up in 3 day(s).   Specialty:  Family Medicine Contact information: [place] [address] [phone]        Derek Jack, MD Follow up.   Specialty:  Hematology Contact information: [place] [id number] [phone]            The results of significant diagnostics from this hospitalization (including imaging, microbiology, ancillary and laboratory) are listed below for reference.    Significant Diagnostic Studies: Dg Chest 1 View  Result Date: 04/20/2017 CLINICAL DATA:  Pre op liver ablation - hx hypertension - diabetic - hx cirrhosis - hx breast cancer with lumpectomy and radiation - never a smoker EXAM: CHEST 1 VIEW COMPARISON:  03/10/2016 FINDINGS: Heart size is UPPER limits normal. There is increased opacity in the MEDIAL RIGHT lung base. Suspect RIGHT pleural effusion. LEFT lung is clear. Surgical clips are identified in the RIGHT axillary region. IMPRESSION: Interval development of RIGHT LOWER lobe opacity, likely representing atelectasis. RIGHT pleural effusion. Electronically Signed   By: Nolon Nations M.D.   On: 04/20/2017 16:02   US Abdomen Complete  Result Date: 05/05/2017 CLINICAL DATA:  Thrombocytopenia. Recent thermal ablation of hepatic mass. EXAM: ABDOMEN ULTRASOUND COMPLETE COMPARISON:  CT from thermal ablation 04/26/2017 FINDINGS: Gallbladder: Physiologically distended containing intraluminal stones. There is wall thickening of 6 mm. No sonographic Murphy sign noted by sonographer. Common bile duct: Diameter: 4 mm. Liver: Increased heterogeneous hepatic echogenicity with capsular nodularity consistent with cirrhosis. Echogenic focus measuring 2 cm in the left lobe may be site of ablation. Portal vein is patent on color Doppler imaging with normal direction of blood flow towards the liver. IVC: No abnormality visualized. Pancreas: Not well visualized due to bowel gas. Spleen: Enlarged measuring 12.2 x 12.0 x 8.2 cm  for volume of 622 cc, ultrasound may underestimate the degree of splenomegaly. No focal lesion. Right Kidney: Length: 11.0 cm. Echogenicity within normal limits. No mass or hydronephrosis visualized. Left Kidney: Length: 10.6 cm. Echogenicity within normal limits. No mass or hydronephrosis visualized. Abdominal aorta: Majority is obscured by bowel gas. Normal in caliber proximally. Other findings: No definite intra-abdominal ascites. Right pleural effusion is noted. IMPRESSION: 1. Splenomegaly, though the splenic size may be underestimated sonographically. 2. Hepatic cirrhosis. Rounded 2 cm echogenic focus in the left lobe may be site of prior ablation. 3. Gallstones with mild gallbladder wall thickening, nonspecific in the setting of chronic liver disease. No sonographic Murphy sign or pericholecystic fluid. Electronically Signed   By: Jeb Levering M.D.   On: 05/05/2017 06:53   Ct Guide Tissue  Ablation  Result Date: 04/26/2017 CLINICAL DATA:  History of cirrhosis and hepatocellular carcinoma and status post prior thermal ablation of a segment 4A hepatocellular carcinoma on 03/11/2016. Rising AFP during follow-up with imaging demonstrating focus of recurrent enhancing tumor along the anterior and inferior margin of prior ablation. The patient presents for repeat thermal ablation to treat the area of recurrent [place]. EXAM: CT-GUIDED PERCUTANEOUS THERMAL ABLATION OF LIVER COMPARISON:  MRI of the abdomen on 01/18/2017 ANESTHESIA/SEDATION: Anesthesia:  General MEDICATIONS: 3.375 g IV Zosyn. Prior to the procedure, the patient also received a 1 unit transfusion of platelets. CONTRAST:  75 mL Isovue 370 IV PROCEDURE: The procedure, risks, benefits, and alternatives were explained to the patient. Questions regarding the procedure were encouraged and answered. The patient understands and consents to the procedure. A time-out was performed prior to initiating the procedure. The patient was placed under general  anesthesia. Initial unenhanced CT was performed in a supine position with the right side rolled up slightly. The abdominal wall was prepped with chlorhexidine in a sterile fashion, and a sterile drape was applied covering the operative field. A sterile gown and sterile gloves were used for the procedure. Under CT guidance, a 17 cm length, 17 G NeuWave PR percutaneous thermal ablation probe was advanced into the liver. Probe positioning was confirmed by CT prior to ablation. Thermal ablation was performed through the single probe. A 7 minute cycle of thermal ablation was performed at 63 W. Post-procedural CT was performed. The ablation probe was then removed as the tract was cauterized. COMPLICATIONS: None FINDINGS: Focal area of subtle enhancement inferior to the previous ablation defect was localized after administration of contrast. A single ablation probe was advanced from a lateral approach to the level of abnormal enhancement. IMPRESSION: CT guided percutaneous thermal ablation of left lobe hepatocellular carcinoma recurrence. The patient will be observed overnight. Initial follow-up will be performed in approximately 4 weeks. Electronically Signed   By: Aletta Edouard M.D.   On: 04/26/2017 17:14   Dg Chest Port 1 View  Result Date: 05/05/2017 CLINICAL DATA:  Follow-up pleural effusion EXAM: PORTABLE CHEST 1 VIEW COMPARISON:  05/03/2017 FINDINGS: Cardiac shadow is stable. Aortic calcifications are again seen. The left lung remains clear. Right lung again demonstrates a moderate to large pleural effusion overall stable from the prior exam. No bony abnormality is noted. No new focal infiltrates are noted. IMPRESSION: Overall stable appearance of the chest when compared with the prior exam. Electronically Signed   By: Inez Catalina M.D.   On: 05/05/2017 06:49   Dg Chest Port 1 View  Result Date: 05/04/2017 CLINICAL DATA:  Pleural effusion. EXAM: PORTABLE CHEST 1 VIEW COMPARISON:  Radiograph 24 hours prior  FINDINGS: Large right pleural effusion with slight decrease from prior exam. Slight improved right perihilar aeration. Mild leftward tracheal deviation is unchanged from prior. Unchanged mediastinal contours with right heart border obscured. Left lung grossly clear. IMPRESSION: Large right pleural effusion with slight improvement from prior exam and improved perihilar aeration. Mass effect with leftward tracheal shift unchanged. Electronically Signed   By: Jeb Levering M.D.   On: 05/04/2017 00:04    Microbiology: Recent Results (from the past 240 hour(s))  Culture, blood (routine x 2)     Status: None   Collection Time: 05/03/17  9:30 PM  Result Value Ref Range Status   Specimen Description BLOOD LEFT ARM  Final   Special Requests   Final    BOTTLES DRAWN AEROBIC AND ANAEROBIC Blood Culture adequate  volume   Culture   Final    NO GROWTH 5 DAYS Performed at [facility], [place] [address] [place], [place] [address]    Report Status 05/08/2017 FINAL  Final  Culture, blood (routine x 2)     Status: None   Collection Time: 05/03/17  9:35 PM  Result Value Ref Range Status   Specimen Description BLOOD LEFT ARM  Final   Special Requests AEROBIC BOTTLE ONLY Blood Culture adequate volume  Final   Culture   Final    NO GROWTH 5 DAYS Performed at [facility], [place] [address], [place], [place] [address]    Report Status 05/08/2017 FINAL  Final  MRSA PCR Screening     Status: None   Collection Time: 05/04/17 12:23 AM  Result Value Ref Range Status   MRSA by PCR NEGATIVE NEGATIVE Final    Comment:        The GeneXpert MRSA Assay (FDA approved for NASAL specimens only), is one component of a comprehensive MRSA colonization surveillance program. It is not intended to diagnose MRSA infection nor to guide or monitor treatment for MRSA infections. Performed at [facility], [place] [address] [place], [place] [address]   Body fluid culture (includes gram stain)     Status:  None (Preliminary result)   Collection Time: 05/05/17 12:34 PM  Result Value Ref Range Status   Specimen Description FLUID  Final   Special Requests PLEURAL RIGHT  Final   Gram Stain   Final    RARE WBC PRESENT,BOTH PMN AND MONONUCLEAR NO ORGANISMS SEEN    Culture   Final    NO GROWTH 3 DAYS Performed at [facility], [place] [address], [place], [place] [address]    Report Status PENDING  Incomplete     Labs: Basic Metabolic Panel: Recent Labs  Lab 05/04/17 0423 05/05/17 0434 05/06/17 0645 05/07/17 0519 05/08/17 0714 05/09/17 0442  NA 142 144 138 139 138  --   K 3.6 3.0* 4.7 3.1* 3.1*  --   CL 97* 97* 96* 96* 94*  --   CO2 30 32 28 31 31   --   GLUCOSE 157* 125* 134* 106* 85  --   BUN 12 13 13 11 10   --   CREATININE 0.83 0.87 0.87 0.89 0.93  --   CALCIUM 8.7* 8.4* 7.8* 8.1* 8.2*  --   MG 2.0 2.0 1.9 1.8 1.8 1.9  PHOS 4.0 4.8* 4.3 4.1 3.8 3.1   Liver Function Tests: Recent Labs  Lab 05/03/17 2122  AST 48*  ALT 34  ALKPHOS 76  BILITOT 1.4*  PROT 6.8  ALBUMIN 3.0*   No results for input(s): LIPASE, AMYLASE in the last 168 hours. No results for input(s): AMMONIA in the last 168 hours. CBC: Recent Labs  Lab 05/03/17 2122 05/04/17 0423 05/05/17 0434 05/06/17 0645 05/07/17 0519 05/08/17 0714  WBC 4.8 4.4 2.7* 2.1* 2.3* 3.1*  NEUTROABS 3.4  --   --   --   --   --   HGB 13.0 14.0 12.8 12.9 12.6 13.5  HCT 38.7 42.3 39.8 38.2 37.9 40.4  MCV 91.7 92.8 92.3 92.7 91.1 92.4  PLT 52* 54* 45* 49* 36* 38*   Cardiac Enzymes: No results for input(s): CKTOTAL, CKMB, CKMBINDEX, TROPONINI in the last 168 hours. BNP: BNP (last 3 results) Recent Labs    05/03/17 2358  BNP 210.9*    ProBNP (last 3 results) No results for input(s):  PROBNP in the last 8760 hours.  CBG: Recent Labs  Lab 05/08/17 1138 05/08/17 1725 05/08/17 2124 05/09/17 0743 05/09/17 0833  GLUCAP 152* 149* 174* 68 142*       Signed:  Kayleen Memos, MD [facility] 05/09/2017,  11:18 AM

## 2017-05-22 DIAGNOSIS — I4891 Unspecified atrial fibrillation: Secondary | ICD-10-CM | POA: Diagnosis not present

## 2017-05-22 DIAGNOSIS — J90 Pleural effusion, not elsewhere classified: Secondary | ICD-10-CM | POA: Diagnosis not present

## 2017-05-22 DIAGNOSIS — E871 Hypo-osmolality and hyponatremia: Secondary | ICD-10-CM | POA: Diagnosis not present

## 2017-05-22 DIAGNOSIS — R0600 Dyspnea, unspecified: Secondary | ICD-10-CM | POA: Diagnosis not present

## 2017-05-22 DIAGNOSIS — K7581 Nonalcoholic steatohepatitis (NASH): Secondary | ICD-10-CM | POA: Diagnosis not present

## 2017-05-22 DIAGNOSIS — R609 Edema, unspecified: Secondary | ICD-10-CM | POA: Diagnosis not present

## 2017-05-22 DIAGNOSIS — R0902 Hypoxemia: Secondary | ICD-10-CM | POA: Diagnosis not present

## 2017-05-23 ENCOUNTER — Inpatient Hospital Stay (HOSPITAL_COMMUNITY)
Admission: AD | Admit: 2017-05-23 | Discharge: 2017-05-31 | DRG: 186 | Disposition: A | Discharge status: Skilled Nursing Facility | Payer: Medicare Other | Source: Other Acute Inpatient Hospital | Attending: Internal Medicine | Admitting: Internal Medicine

## 2017-05-23 ENCOUNTER — Ambulatory Visit: Payer: Medicare Other | Admitting: Endocrinology

## 2017-05-23 ENCOUNTER — Other Ambulatory Visit: Payer: Self-pay

## 2017-05-23 ENCOUNTER — Other Ambulatory Visit: Payer: Medicare Other

## 2017-05-23 ENCOUNTER — Inpatient Hospital Stay (HOSPITAL_COMMUNITY): Payer: Medicare Other

## 2017-05-23 ENCOUNTER — Encounter (HOSPITAL_COMMUNITY): Payer: Self-pay

## 2017-05-23 DIAGNOSIS — K219 Gastro-esophageal reflux disease without esophagitis: Secondary | ICD-10-CM | POA: Diagnosis present

## 2017-05-23 DIAGNOSIS — I5033 Acute on chronic diastolic (congestive) heart failure: Secondary | ICD-10-CM | POA: Diagnosis present

## 2017-05-23 DIAGNOSIS — J90 Pleural effusion, not elsewhere classified: Secondary | ICD-10-CM | POA: Diagnosis not present

## 2017-05-23 DIAGNOSIS — R2681 Unsteadiness on feet: Secondary | ICD-10-CM | POA: Diagnosis not present

## 2017-05-23 DIAGNOSIS — K7460 Unspecified cirrhosis of liver: Secondary | ICD-10-CM | POA: Diagnosis not present

## 2017-05-23 DIAGNOSIS — Z79899 Other long term (current) drug therapy: Secondary | ICD-10-CM

## 2017-05-23 DIAGNOSIS — D696 Thrombocytopenia, unspecified: Secondary | ICD-10-CM | POA: Diagnosis present

## 2017-05-23 DIAGNOSIS — K7469 Other cirrhosis of liver: Secondary | ICD-10-CM | POA: Diagnosis present

## 2017-05-23 DIAGNOSIS — E039 Hypothyroidism, unspecified: Secondary | ICD-10-CM | POA: Diagnosis present

## 2017-05-23 DIAGNOSIS — I110 Hypertensive heart disease with heart failure: Secondary | ICD-10-CM | POA: Diagnosis present

## 2017-05-23 DIAGNOSIS — I482 Chronic atrial fibrillation: Secondary | ICD-10-CM | POA: Diagnosis present

## 2017-05-23 DIAGNOSIS — J449 Chronic obstructive pulmonary disease, unspecified: Secondary | ICD-10-CM | POA: Diagnosis present

## 2017-05-23 DIAGNOSIS — K7210 Chronic hepatic failure without coma: Secondary | ICD-10-CM | POA: Diagnosis present

## 2017-05-23 DIAGNOSIS — J948 Other specified pleural conditions: Secondary | ICD-10-CM | POA: Diagnosis not present

## 2017-05-23 DIAGNOSIS — K766 Portal hypertension: Secondary | ICD-10-CM | POA: Diagnosis present

## 2017-05-23 DIAGNOSIS — Z8505 Personal history of malignant neoplasm of liver: Secondary | ICD-10-CM | POA: Diagnosis not present

## 2017-05-23 DIAGNOSIS — Z794 Long term (current) use of insulin: Secondary | ICD-10-CM

## 2017-05-23 DIAGNOSIS — R0602 Shortness of breath: Secondary | ICD-10-CM | POA: Diagnosis not present

## 2017-05-23 DIAGNOSIS — Z923 Personal history of irradiation: Secondary | ICD-10-CM

## 2017-05-23 DIAGNOSIS — G4700 Insomnia, unspecified: Secondary | ICD-10-CM | POA: Diagnosis not present

## 2017-05-23 DIAGNOSIS — R21 Rash and other nonspecific skin eruption: Secondary | ICD-10-CM | POA: Diagnosis present

## 2017-05-23 DIAGNOSIS — Z85828 Personal history of other malignant neoplasm of skin: Secondary | ICD-10-CM | POA: Diagnosis not present

## 2017-05-23 DIAGNOSIS — E119 Type 2 diabetes mellitus without complications: Secondary | ICD-10-CM | POA: Diagnosis present

## 2017-05-23 DIAGNOSIS — R0600 Dyspnea, unspecified: Secondary | ICD-10-CM

## 2017-05-23 DIAGNOSIS — Z9889 Other specified postprocedural states: Secondary | ICD-10-CM | POA: Diagnosis not present

## 2017-05-23 DIAGNOSIS — C220 Liver cell carcinoma: Secondary | ICD-10-CM | POA: Diagnosis not present

## 2017-05-23 DIAGNOSIS — R0902 Hypoxemia: Secondary | ICD-10-CM | POA: Diagnosis not present

## 2017-05-23 DIAGNOSIS — K7581 Nonalcoholic steatohepatitis (NASH): Secondary | ICD-10-CM | POA: Diagnosis present

## 2017-05-23 DIAGNOSIS — E118 Type 2 diabetes mellitus with unspecified complications: Secondary | ICD-10-CM | POA: Diagnosis not present

## 2017-05-23 DIAGNOSIS — E0590 Thyrotoxicosis, unspecified without thyrotoxic crisis or storm: Secondary | ICD-10-CM | POA: Diagnosis present

## 2017-05-23 DIAGNOSIS — Z9981 Dependence on supplemental oxygen: Secondary | ICD-10-CM | POA: Diagnosis not present

## 2017-05-23 DIAGNOSIS — Z7982 Long term (current) use of aspirin: Secondary | ICD-10-CM | POA: Diagnosis not present

## 2017-05-23 DIAGNOSIS — I10 Essential (primary) hypertension: Secondary | ICD-10-CM | POA: Diagnosis present

## 2017-05-23 DIAGNOSIS — E785 Hyperlipidemia, unspecified: Secondary | ICD-10-CM | POA: Diagnosis present

## 2017-05-23 DIAGNOSIS — Z853 Personal history of malignant neoplasm of breast: Secondary | ICD-10-CM | POA: Diagnosis not present

## 2017-05-23 DIAGNOSIS — E871 Hypo-osmolality and hyponatremia: Secondary | ICD-10-CM | POA: Diagnosis present

## 2017-05-23 DIAGNOSIS — R262 Difficulty in walking, not elsewhere classified: Secondary | ICD-10-CM | POA: Diagnosis not present

## 2017-05-23 DIAGNOSIS — J918 Pleural effusion in other conditions classified elsewhere: Secondary | ICD-10-CM | POA: Diagnosis not present

## 2017-05-23 DIAGNOSIS — K7290 Hepatic failure, unspecified without coma: Secondary | ICD-10-CM | POA: Diagnosis not present

## 2017-05-23 DIAGNOSIS — M6281 Muscle weakness (generalized): Secondary | ICD-10-CM | POA: Diagnosis not present

## 2017-05-23 DIAGNOSIS — R0603 Acute respiratory distress: Secondary | ICD-10-CM | POA: Diagnosis not present

## 2017-05-23 HISTORY — PX: IR THORACENTESIS ASP PLEURAL SPACE W/IMG GUIDE: IMG5380

## 2017-05-23 LAB — GLUCOSE, CAPILLARY
GLUCOSE-CAPILLARY: 132 mg/dL — AB (ref 65–99)
GLUCOSE-CAPILLARY: 247 mg/dL — AB (ref 65–99)
Glucose-Capillary: 141 mg/dL — ABNORMAL HIGH (ref 65–99)
Glucose-Capillary: 141 mg/dL — ABNORMAL HIGH (ref 65–99)
Glucose-Capillary: 124 mg/dL — ABNORMAL HIGH (ref 65–99)
Glucose-Capillary: 202 mg/dL — ABNORMAL HIGH (ref 65–99)

## 2017-05-23 LAB — COMPREHENSIVE METABOLIC PANEL
ALBUMIN: 2.6 g/dL — AB (ref 3.5–5.0)
ALK PHOS: 65 U/L (ref 38–126)
ALT: 30 U/L (ref 14–54)
AST: 47 U/L — AB (ref 15–41)
Anion gap: 9 (ref 5–15)
BILIRUBIN TOTAL: 1.3 mg/dL — AB (ref 0.3–1.2)
BUN: 15 mg/dL (ref 6–20)
CALCIUM: 8.4 mg/dL — AB (ref 8.9–10.3)
CO2: 25 mmol/L (ref 22–32)
Chloride: 92 mmol/L — ABNORMAL LOW (ref 101–111)
Creatinine, Ser: 0.76 mg/dL (ref 0.44–1.00)
GFR calc Af Amer: >60 mL/min (ref >60)
GFR calc non Af Amer: >60 mL/min (ref >60)
GLUCOSE: 128 mg/dL — AB (ref 65–99)
POTASSIUM: 4 mmol/L (ref 3.5–5.1)
Sodium: 126 mmol/L — ABNORMAL LOW (ref 135–145)
TOTAL PROTEIN: 5.7 g/dL — AB (ref 6.5–8.1)

## 2017-05-23 LAB — CBC
HEMATOCRIT: 35.3 % — AB (ref 36.0–46.0)
HEMOGLOBIN: 12.1 g/dL (ref 12.0–15.0)
MCH: 30 pg (ref 26.0–34.0)
MCHC: 34.3 g/dL (ref 30.0–36.0)
MCV: 87.6 fL (ref 78.0–100.0)
Platelets: 23 10*3/uL — CL (ref 150–400)
RBC: 4.03 MIL/uL (ref 3.87–5.11)
RDW: 14.7 % (ref 11.5–15.5)
WBC: 2.5 10*3/uL — AB (ref 4.0–10.5)

## 2017-05-23 LAB — MAGNESIUM: MAGNESIUM: 1.6 mg/dL — AB (ref 1.7–2.4)

## 2017-05-23 LAB — PHOSPHORUS: Phosphorus: 4.2 mg/dL (ref 2.5–4.6)

## 2017-05-23 MED ORDER — LIDOCAINE HCL (PF) 1 % IJ SOLN
INTRAMUSCULAR | Status: DC | PRN
  Administered 2017-05-23: 10 mL

## 2017-05-23 MED ORDER — FUROSEMIDE 20 MG PO TABS: 20.0000 mg | ORAL_TABLET | Freq: Every day | ORAL | Status: DC

## 2017-05-23 MED ORDER — ASPIRIN EC 81 MG PO TBEC
81.0000 mg | DELAYED_RELEASE_TABLET | Freq: Every day | ORAL | Status: DC
  Administered 2017-05-23: 81 mg via ORAL
  Filled 2017-05-23: qty 1

## 2017-05-23 MED ORDER — QUETIAPINE FUMARATE 25 MG PO TABS
50.0000 mg | ORAL_TABLET | Freq: Every day | ORAL | Status: DC
  Administered 2017-05-23 – 2017-05-25 (×4): 50 mg via ORAL
  Filled 2017-05-23 (×4): qty 2

## 2017-05-23 MED ORDER — FUROSEMIDE 10 MG/ML IJ SOLN
40.0000 mg | Freq: Every day | INTRAMUSCULAR | Status: DC
  Administered 2017-05-23: 40 mg via INTRAVENOUS
  Filled 2017-05-23: qty 4

## 2017-05-23 MED ORDER — PROPRANOLOL HCL 40 MG PO TABS
20.0000 mg | ORAL_TABLET | Freq: Two times a day (BID) | ORAL | Status: DC
  Administered 2017-05-23 – 2017-05-26 (×8): 20 mg via ORAL
  Filled 2017-05-23 (×9): qty 1

## 2017-05-23 MED ORDER — LIDOCAINE HCL (PF) 2 % IJ SOLN
INTRAMUSCULAR | Status: AC
  Filled 2017-05-23: qty 20

## 2017-05-23 MED ORDER — INSULIN ASPART 100 UNIT/ML ~~LOC~~ SOLN
0.0000 [IU] | SUBCUTANEOUS | Status: DC
  Administered 2017-05-23 (×2): 2 [IU] via SUBCUTANEOUS
  Administered 2017-05-23: 4 [IU] via SUBCUTANEOUS
  Administered 2017-05-23: 5 [IU] via SUBCUTANEOUS
  Administered 2017-05-24: 11 [IU] via SUBCUTANEOUS
  Administered 2017-05-24: 8 [IU] via SUBCUTANEOUS
  Administered 2017-05-24: 11 [IU] via SUBCUTANEOUS
  Administered 2017-05-24: 2 [IU] via SUBCUTANEOUS
  Administered 2017-05-25: 5 [IU] via SUBCUTANEOUS
  Administered 2017-05-25: 2 [IU] via SUBCUTANEOUS

## 2017-05-23 MED ORDER — PANTOPRAZOLE SODIUM 40 MG PO TBEC
40.0000 mg | DELAYED_RELEASE_TABLET | Freq: Every day | ORAL | Status: DC
  Administered 2017-05-23 – 2017-05-24 (×2): 40 mg via ORAL
  Filled 2017-05-23 (×2): qty 1

## 2017-05-23 MED ORDER — SIMVASTATIN 40 MG PO TABS
40.0000 mg | ORAL_TABLET | Freq: Every day | ORAL | Status: DC
  Administered 2017-05-23 – 2017-05-30 (×9): 40 mg via ORAL
  Filled 2017-05-23 (×9): qty 1

## 2017-05-23 MED ORDER — HEPARIN SODIUM (PORCINE) 5000 UNIT/ML IJ SOLN
5000.0000 [IU] | Freq: Three times a day (TID) | INTRAMUSCULAR | Status: DC
  Administered 2017-05-23: 5000 [IU] via SUBCUTANEOUS
  Filled 2017-05-23: qty 1

## 2017-05-23 MED ORDER — SODIUM CHLORIDE 0.9 % IV SOLN: 250.0000 mL | INTRAVENOUS | Status: DC | PRN

## 2017-05-23 MED ORDER — FUROSEMIDE 10 MG/ML IJ SOLN
40.0000 mg | Freq: Two times a day (BID) | INTRAMUSCULAR | Status: DC
  Administered 2017-05-23 – 2017-05-26 (×6): 40 mg via INTRAVENOUS
  Filled 2017-05-23 (×6): qty 4

## 2017-05-23 NOTE — Progress Notes (Addendum)
   Follow Up Note  HPI: Please see full H&P done earlier this morning by the critical care team  Briefly 77 year old female with past medical history significant for NASH cirrhosis, esophageal varices status post banding, heart failure with preserved ejection fraction, breast cancer status post lumpectomy and radiation, diastolic CHF, hepatocellular carcinoma presents with worsening shortness of breath secondary to recurrent right-sided pleural effusion (noted on CXR) likely due to cirrhosis.  Patient was transferred from [facility] ED, where she got a dose of Lasix with improvement in her breathing status by the time of transfer to [facility].  Patient was briefly seen by critical care team this morning and was transferred to [facility] for management.  Patient was also noted to have hyponatremia which has slightly improved, chronic thrombocytopenia with some scattered purpuric rash on both shins and feet.  Patient admitted for further management.  Exam: NAD, generalized edema CV: S1, S2 present Lungs: Diminished breath sounds bibasilar, worse on the right Abd: Obese, not distended, nontender, bowel sounds present Ext: 2+ BLE edema, scattered.  Rash on both shins and feet  Present on Admission: . Pleural effusion  Plan Increase IV Lasix to 40 mg twice daily Consulted IR for thoracentesis pending, spoke to IR attending Dr. Annamaria Boots Consider cardiology consult if no significant improvement after thoracentesis Consider oncology consult if worsening thrombocytopenia/bleeding Hyponatremia should improve as hypervolemia improves Held home ASA and no anticoagulation for now due to thrombocytopenia with purpuric lesion Monitor closely in SDU

## 2017-05-23 NOTE — Progress Notes (Signed)
Large Rt effusion 05/05/17 -was transudate Known NASH cirrhosis \  CHILD C Score - 7 (baed on march elaaution of no ascites, and normal INR and currentl albumin, bilirubin and normal mental status)  Dw [facility] from remote  Plan  - [facility] is arranging for IR guided thora - if repeat is also transudate recommend GI consultation and evalution for diuresis contineud vs TIPS to deal with hepartic hydrothorax (TIPS is lowr risk if CHILD score < 10 and patient is 7)   cccm will sign off  Dr. Brand Males, M.D., [facility].C.P Pulmonary and Critical Care Medicine Staff Physician, [employer] Director - Interstitial Lung Disease  Program  Pulmonary [place] [facility] [place], [place], [address]  Pager: [phone], If no answer or between  15:00h - 7:00h: call [phone] Telephone: [phone]

## 2017-05-23 NOTE — H&P (Signed)
PULMONARY / CRITICAL CARE MEDICINE   Name: Cheryl Velasquez MRN: [id number] DOB: [birth year]    ADMISSION DATE:  05/23/2017 CONSULTATION DATE:  05/23/17  REFERRING MD:  Sadie Haber  CHIEF COMPLAINT:  Shortness of breath  HISTORY OF PRESENT ILLNESS:   Cheryl Velasquez is a 77 y/o woman with COPD, HFpEF, NASH cirrhosis with [place] s/p ablation in January and again on 3/13 who was  recently discharged from [place] on 3/26 after admission for dyspnea with a large right sided pleural effusion to [facility] in [place]. During the day on 4/8 she developed shortness of breath at rest that did not improve with one increased lasix dose so was sent to the [facility] ED for evaluation. Dyspnea was partially improved with lasix and supplemental O2 but ABG obtained showed hypoxia (7.33/56.8/42.3/29.8 on 21% FiO2). CXR was obtained showing large R sided pleural effusion. Other abnormal labs including: platelets 112, Na 121, BNP 390. There was concern for atrial fibrillation on cardiac monitoring in the ED. Her shortness of breath was partially improved by time of transfer to [facility] for continued care.   PAST MEDICAL HISTORY :  She  has a past medical history of Arthritis, Cancer of right breast ([place]) (2005), Dyspnea, GERD (gastroesophageal reflux disease), Hepatocellular carcinoma ([place]), History of chicken pox, History of transfusion of platelets, Hyperlipidemia, Hypertension, Hypothyroidism, Liver cirrhosis secondary to NASH [facility]), Thyroid disease, and Type II diabetes mellitus ([place]).  PAST SURGICAL HISTORY: She  has a past surgical history that includes Esophagogastroduodenoscopy (N/A, 09/20/2012); Esophagogastroduodenoscopy (N/A, 12/13/2012); esophageal banding (N/A, 12/13/2012); Esophagogastroduodenoscopy (N/A, 01/11/2016); ir generic historical (01/19/2016); thyroid treatment; ir generic historical (03/29/2016); Radiofrequency ablation (N/A, 03/11/2016); IR Radiologist Eval &  Mgmt (06/14/2016); IR Radiologist Eval & Mgmt (11/24/2016); IR Radiologist Eval & Mgmt (12/13/2016); IR RADIOLOGY PERIPHERAL GUIDED IV START (01/18/2017); IR US Guide Vasc Access Left (01/18/2017); IR Radiologist Eval & Mgmt (03/15/2017); Radiology with anesthesia (N/A, 04/26/2017); Hernia repair (2004); Skin cancer excision; Cataract extraction w/ intraocular lens  implant, bilateral (Bilateral); Abdominal hysterectomy (1987); Breast biopsy (Right, 2005); and Breast lumpectomy (Right, 2005).  No Known Allergies  No current facility-administered medications on file prior to encounter.    Current Outpatient Medications on File Prior to Encounter  Medication Sig  . aspirin EC 81 MG EC tablet Take 1 tablet (81 mg total) by mouth daily.  . Calcium Carbonate-Vitamin D (CALTRATE 600+D) 600-400 MG-UNIT per tablet Take 1 tablet by mouth daily.    . furosemide (LASIX) 20 MG tablet Take 1 tablet (20 mg total) by mouth daily.  . hydroxypropyl methylcellulose (ISOPTO TEARS) 2.5 % ophthalmic solution Place 1 drop into both eyes 3 (three) times daily as needed for dry eyes.   . insulin aspart (NOVOLOG) 100 UNIT/ML injection Inject 5 Units into the skin 3 (three) times daily with meals.  . insulin glargine (LANTUS) 100 UNIT/ML injection Inject 0.2 mLs (20 Units total) into the skin at bedtime.  . Multiple Vitamin (MULTIVITAMIN WITH MINERALS) TABS tablet Take 1 tablet by mouth daily. CENTRUM SILVER  . pantoprazole (PROTONIX) 40 MG tablet TAKE 1 TABLET BY MOUTH EVERY DAY (Patient taking differently: Take 40 mg by mouth once a day in the evening)  . propranolol (INDERAL) 10 MG tablet Take 2 tablets (20 mg total) by mouth 2 (two) times daily.  . QUEtiapine (SEROQUEL) 50 MG tablet Take 1 tablet (50 mg total) by mouth at bedtime.  . simvastatin (ZOCOR) 40 MG tablet Take 1 tablet (40 mg total)  by mouth at bedtime.    FAMILY HISTORY:  Her indicated that her mother is deceased. She indicated that her father is deceased. She  indicated that only one of her three sisters is alive. She indicated that only one of her four brothers is alive. She indicated that the status of her maternal aunt is unknown. She indicated that only one of her four others is alive.   SOCIAL HISTORY: She  reports that she has never smoked. She has never used smokeless tobacco. She reports that she does not drink alcohol or use drugs.  REVIEW OF SYSTEMS:   Review of Systems  Constitutional: Negative for chills and fever.  HENT: Positive for congestion.   Eyes: Negative for blurred vision.  Respiratory: Positive for shortness of breath. Negative for sputum production.   Cardiovascular: Positive for leg swelling. Negative for chest pain.  Gastrointestinal: Negative for nausea.  Genitourinary: Negative for dysuria.  Musculoskeletal: Negative for myalgias.  Skin: Positive for rash.  Neurological: Negative for dizziness.  Endo/Heme/Allergies: Bruises/bleeds easily.     SUBJECTIVE:  She appears comfortable at rest on nasal cannula with some faint wheezing. Admission is for hypoxic respiratory failure is secondary to recurrence of large R pleural effusion but does not appear to be in a critical condition.  VITAL SIGNS: BP (!) 165/85 (BP Location: Right Arm)   Pulse 77   Temp 98.4 F (36.9 C) (Oral)   Resp 20   Ht 5' 2"  (1.575 m)   Wt 248 lb 14.4 oz (112.9 kg)   SpO2 97%   BMI 45.52 kg/m     PHYSICAL EXAMINATION: General:  Obese, oriented, chronically ill appearing woman in no acute distress Neuro:  Moving all extremities, peripheral sensation intact HEENT:  PERRL, oral mucosa is moist, no visible JVD Cardiovascular:  Irregular rhythm with normal rate, extremities are faintly cool but normal pulses and cap refill, pitting pretibial edema in both legs and feet Lungs:  Diminished breath sounds in R lung fields, expiratory wheeze from upper airway Abdomen:  Obese but not visibly distended, nontender not tympanic, pitting edema in  lower abdominal wall Musculoskeletal:  Normal strength, no joint erythema or warmth Skin:  Scattered purpuric rash on both shins and feet without warmth, tenderness, pruritis  LABS:  Outside ED labs obtained 4/8:  Na 121 K 4.6 Cl 88 CO2 24 Ca 8.5 BUN 18 Cr 0.73  Protein 6.8 Albumin 3.2 T.Bili 1.52  INR 1.2  CBG 216  WBC 7.4 Hgb 13.1 Plts 112  CXR: Large R pleural effusion  DISCUSSION: 77 y/o multimorbid woman with COPD, HFpEF, and NASH cirrhosis with HCC s/p ablation with increased dyspnea most likely from fluid and reaccumulation of R pleural effusion. She improved significantly with increased diuresis during recent hospitalization in March.  ASSESSMENT / PLAN:  R sided pleural effusion: She has a history of diastolic CHF. She appears to be volume overloaded above baseline with associated hyponatremia. Multifactorial with cirrhosis as well. She likely needs increased ongoing diuresis with only 86m daily at her facility PTA. Last admission 1300cc bloody fluid removed with thoracentesis was a free flowing effusion. - Supplemental O2 as needed to keep SpO2 > 90% - CXR - IV diuresis starting 43mlasix and follow electrolytes closely - Strict I&O - Consider ultrasound and thoracentesis in AM  NASH cirrhosis with history of esophageal varices - Continue propanolol for esophageal varices - Repeat CMP  DM - CBG monitoring and SSI - Holding home glipizide, metformin  HTN, HLD - Captopril,  can withhold if Cr increasing on diuresis  Afib Documented at outside ED. Questionable on monitor and exam here. - EKG   Diet: Heart healthy VTE ppx: enoxaparin  Dispo: Patient stable to transfer to [facility] service in day shift Code: Full  Collier Salina, MD PGY-III Internal Medicine Resident Pager# [phone] 05/23/2017, 2:28 AM

## 2017-05-23 NOTE — Plan of Care (Signed)
  Problem: Pain Managment: Goal: General experience of comfort will improve Outcome: Progressing   

## 2017-05-23 NOTE — Procedures (Signed)
PROCEDURE SUMMARY:  Successful US guided therapeutic right thoracentesis. Yielded 1.8 liters of clear, yellow fluid. Pt tolerated procedure well. No immediate complications.  Specimen was not sent for labs. CXR ordered.  Docia Barrier PA-C 05/23/2017 4:36 PM

## 2017-05-24 LAB — GLUCOSE, CAPILLARY
GLUCOSE-CAPILLARY: 225 mg/dL — AB (ref 65–99)
GLUCOSE-CAPILLARY: 314 mg/dL — AB (ref 65–99)
Glucose-Capillary: 110 mg/dL — ABNORMAL HIGH (ref 65–99)
Glucose-Capillary: 147 mg/dL — ABNORMAL HIGH (ref 65–99)
Glucose-Capillary: 332 mg/dL — ABNORMAL HIGH (ref 65–99)
Glucose-Capillary: 86 mg/dL (ref 65–99)

## 2017-05-24 LAB — CBC WITH DIFFERENTIAL/PLATELET
BAND NEUTROPHILS: 0 %
BASOS ABS: 0 10*3/uL (ref 0.0–0.1)
Basophils Relative: 0 %
Blasts: 0 %
EOS PCT: 0 %
Eosinophils Absolute: 0 10*3/uL (ref 0.0–0.7)
HCT: 33.6 % — ABNORMAL LOW (ref 36.0–46.0)
HEMOGLOBIN: 11.5 g/dL — AB (ref 12.0–15.0)
LYMPHS ABS: 0.6 10*3/uL — AB (ref 0.7–4.0)
Lymphocytes Relative: 27 %
MCH: 29.7 pg (ref 26.0–34.0)
MCHC: 34.2 g/dL (ref 30.0–36.0)
MCV: 86.8 fL (ref 78.0–100.0)
METAMYELOCYTES PCT: 0 %
MONO ABS: 0.5 10*3/uL (ref 0.1–1.0)
MONOS PCT: 21 %
Myelocytes: 0 %
Neutro Abs: 1.2 10*3/uL — ABNORMAL LOW (ref 1.7–7.7)
Neutrophils Relative %: 52 %
Platelets: 66 10*3/uL — ABNORMAL LOW (ref 150–400)
Promyelocytes Relative: 0 %
RBC: 3.87 MIL/uL (ref 3.87–5.11)
RDW: 14.6 % (ref 11.5–15.5)
WBC: 2.3 10*3/uL — ABNORMAL LOW (ref 4.0–10.5)
nRBC: 0 /100 WBC

## 2017-05-24 LAB — BASIC METABOLIC PANEL
Anion gap: 10 (ref 5–15)
BUN: 16 mg/dL (ref 6–20)
CALCIUM: 8.2 mg/dL — AB (ref 8.9–10.3)
CHLORIDE: 91 mmol/L — AB (ref 101–111)
CO2: 27 mmol/L (ref 22–32)
Creatinine, Ser: 0.77 mg/dL (ref 0.44–1.00)
GFR calc non Af Amer: >60 mL/min (ref >60)
GLUCOSE: 95 mg/dL (ref 65–99)
POTASSIUM: 3.9 mmol/L (ref 3.5–5.1)
SODIUM: 128 mmol/L — AB (ref 135–145)

## 2017-05-24 MED ORDER — SPIRONOLACTONE 25 MG PO TABS
50.0000 mg | ORAL_TABLET | Freq: Every day | ORAL | Status: DC
  Administered 2017-05-24 – 2017-05-26 (×3): 50 mg via ORAL
  Filled 2017-05-24 (×3): qty 2

## 2017-05-24 MED ORDER — FAMOTIDINE 20 MG PO TABS
20.0000 mg | ORAL_TABLET | Freq: Every day | ORAL | Status: DC
  Administered 2017-05-24 – 2017-05-31 (×8): 20 mg via ORAL
  Filled 2017-05-24 (×8): qty 1

## 2017-05-24 MED ORDER — ORAL CARE MOUTH RINSE
15.0000 mL | Freq: Two times a day (BID) | OROMUCOSAL | Status: DC
  Administered 2017-05-24 – 2017-05-30 (×9): 15 mL via OROMUCOSAL

## 2017-05-24 NOTE — Evaluation (Signed)
Physical Therapy Evaluation Patient Details Name: Cheryl Velasquez MRN: [id number] DOB: May 24, 1940 Today's Date: 05/24/2017   History of Present Illness  77yo female who was recently discharged from [facility] after admission for dyspnea and R sided pleural effusion. She went to a rehab center, and on 05/22/17 she had SOB that did not improve with lasix and O2 supplementation, and was sent to the ED. She was found to be hypoxic and with R pleural effusion. PMH OA, R breast CA, dyspnea, HTN, hypothyroidism, liver cirrhosis with NASH, DM, hx hernia repair   Clinical Impression   Patient received up in chair, pleasant and willing to work with PT this afternoon; she is [facility] for behaviors, however does at times appear possibly confused, no family/friends present to determine baseline status. She is able to complete functional transfers and gait with Min guard and assist for line management today, SpO2 remained between 95-96% on room air during activity today. She did require MinA for functional bed mobility today, and was left in bed with all needs met this afternoon. She will continue to benefit from skilled PT services in the acute setting, and will likely also benefit from HHPT and 24/7 supervision moving forward.     Follow Up Recommendations Home health PT;Supervision/Assistance - 24 hour    Equipment Recommendations  None recommended by PT    Recommendations for Other Services       Precautions / Restrictions Precautions Precautions: Fall Restrictions Weight Bearing Restrictions: No      Mobility  Bed Mobility Overal bed mobility: Needs Assistance Bed Mobility: Sit to Supine       Sit to supine: Min assist   General bed mobility comments: MinA for LE management/to get legs back in bed   Transfers Overall transfer level: Needs assistance Equipment used: Rolling walker (2 wheeled) Transfers: Sit to/from Stand Sit to Stand: Min guard         General transfer comment: Min guard  for safety, line management   Ambulation/Gait Ambulation/Gait assistance: Min guard Ambulation Distance (Feet): 100 Feet   Gait Pattern/deviations: Step-through pattern;Decreased stride length;Trunk flexed     General Gait Details: Min guard for safety but patient steady and safe with RW, SpO2 remained at 95-96%   Stairs            Wheelchair Mobility    Modified Rankin (Stroke Patients Only)       Balance Overall balance assessment: Mild deficits observed, not formally tested                                           Pertinent Vitals/Pain Pain Assessment: No/denies pain    Home Living Family/patient expects to be discharged to:: Private residence Living Arrangements: Alone Available Help at Discharge: Family;Friend(s);Available PRN/intermittently Type of Home: House Home Access: Stairs to enter   Entrance Stairs-Number of Steps: 1+1 Home Layout: One level Home Equipment: Walker - 2 wheels;Cane - single point;Shower seat;Wheelchair - manual Additional Comments: equipment and PLOF in part taken from prior charting, as patient not very clear on some aspects of her home today     Prior Function Level of Independence: Independent with assistive device(s)         Comments: Occasional use of RW, neighbor brings her groceries     Hand Dominance        Extremity/Trunk Assessment  Lower Extremity Assessment Lower Extremity Assessment: Generalized weakness    Cervical / Trunk Assessment Cervical / Trunk Assessment: Kyphotic  Communication   Communication: No difficulties  Cognition Arousal/Alertness: Awake/alert Behavior During Therapy: WFL for tasks assessed/performed Overall Cognitive Status: No family/caregiver present to determine baseline cognitive functioning                                 General Comments: generally WFL but sometimes appears mildly confused, no family or friends present to establish  baseline       General Comments      Exercises     Assessment/Plan    PT Assessment Patient needs continued PT services  PT Problem List Decreased strength;Decreased mobility;Decreased activity tolerance;Cardiopulmonary status limiting activity;Decreased safety awareness       PT Treatment Interventions Therapeutic activities;Gait training;Therapeutic exercise;Patient/family education;Stair training;Balance training;Functional mobility training;DME instruction;Neuromuscular re-education    PT Goals (Current goals can be found in the Care Plan section)  Acute Rehab PT Goals Patient Stated Goal: return home PT Goal Formulation: With patient Time For Goal Achievement: 06/07/17 Potential to Achieve Goals: Good    Frequency Min 3X/week   Barriers to discharge        Co-evaluation               AM-PAC PT "6 Clicks" Daily Activity  Outcome Measure Difficulty turning over in bed (including adjusting bedclothes, sheets and blankets)?: Unable Difficulty moving from lying on back to sitting on the side of the bed? : Unable Difficulty sitting down on and standing up from a chair with arms (e.g., wheelchair, bedside commode, etc,.)?: Unable Help needed moving to and from a bed to chair (including a wheelchair)?: None Help needed walking in hospital room?: None Help needed climbing 3-5 steps with a railing? : A Little 6 Click Score: 14    End of Session Equipment Utilized During Treatment: Gait belt Activity Tolerance: Patient tolerated treatment well Patient left: in bed;with call bell/phone within reach Nurse Communication: Mobility status PT Visit Diagnosis: Muscle weakness (generalized) (M62.81);Difficulty in walking, not elsewhere classified (R26.2)    Time: 0272-5366 PT Time Calculation (min) (ACUTE ONLY): 28 min   Charges:   PT Evaluation $PT Eval Moderate Complexity: 1 Mod PT Treatments $Gait Training: 8-22 mins   PT G Codes:        Deniece Ree PT,  DPT, CBIS  Supplemental Physical Therapist [place]   Pager [phone]

## 2017-05-24 NOTE — Progress Notes (Signed)
PROGRESS NOTE    Cheryl Velasquez  UKG:[id number] DOB: [birth year] DOA: 05/23/2017 PCP: Galen Manila, MD    Brief Narrative:  77 year old female presented with dyspnea.  She does have the significant past medical history for COPD, heart failure, [place] with cirrhosis/hepatocellular carcinoma.  Recent hospitalization for right-sided transudate pleural effusion. Discharge to a skilled nursing facility where she developed severe and worsening dyspnea, refractory to diuretics.  Initial physical examination blood pressure 165/85, temperature 98.4 respiratory 20, oxygen saturation 97%.,  Heart S1-S2 present irregular, lungs had decreased breath sounds in the right, with expiratory wheeze.  Abdomen protuberant, nontender, with positive pitting edema.  Positive lower extremity edema.  126, potassium 4.0, chloride 92, bicarb 25, glucose 120, BUN 15, creatinine 0.76, magnesium 1.6, white count 2.5, hemoglobin 12.1, hematocrit 35.3, platelets 23.  Chest x-ray with a right pleural effusion, large.  EKG atrial fibrillation with normal axis.   Patient was admitted to the hospital with a working diagnosis of recurrent right pleural effusion.   Assessment & Plan:   Active Problems:   Pleural effusion   Hypoxemia requiring supplemental oxygen  1. Recurrent right pleural effusion/ right hydrothorax due to portal hypertension. Symptoms have improved after thoracentesis, will add aldactone and will continue furosemide, will maximize diuretic therapy before TIPS referral. Continue oxymetry monitoring and supplemental 02 per [place].   2. Chronic liver failure. No signs of encephalopathy, will continue neuro checks. Out of bed as tolerated. Continue propranolol.   3. Heart failure with acute exacerbation dyastolic. Will continue diuresis with furosemide, continue blood pressure monitoring.   4. Atrial fibrillation. Continue rate control, continue telemetry monitoring, not on anticoagulation due to risk of esophageal  varices bleed.   5. Dyslipidemia. Continue statin therapy.   DVT prophylaxis: scd  Code Status: full Family Communication: no family at the bedside  Disposition Plan:  home   Consultants:   Pulmonary   Procedures:     Antimicrobials:       Subjective: Patient is feeling better, no nausea or vomiting, dyspnea has improved, no chest pain or abdominal pain, persistent lower extremity edema.   Objective: Vitals:   05/23/17 2026 05/24/17 0001 05/24/17 0520 05/24/17 0759  BP:  (!) 124/49 124/68 (!) 158/79  Pulse: 69 (!) 53 63 62  Resp: 20 15 18 14   Temp:  (!) 97.5 F (36.4 C) 98.4 F (36.9 C) 97.7 F (36.5 C)  TempSrc:  Oral Oral Oral  SpO2: 92% 98% 98% 100%  Weight:   110.8 kg (244 lb 4.3 oz)   Height:        Intake/Output Summary (Last 24 hours) at 05/24/2017 0812 Last data filed at 05/24/2017 0522 Gross per 24 hour  Intake 600 ml  Output 2401 ml  Net -1801 ml   Filed Weights   05/23/17 0100 05/23/17 0441 05/24/17 0520  Weight: 112.9 kg (248 lb 14.4 oz) 112.9 kg (248 lb 14.4 oz) 110.8 kg (244 lb 4.3 oz)    Examination:   General: deconditioned, not in pain or dyspnea.  Neurology: Awake and alert, non focal  E ENT: mild pallor, no icterus, oral mucosa moist Cardiovascular: No JVD. S1-S2 present, rhythmic, no gallops, rubs, or murmurs. ++/+++ pitting lower extremity edema. Pulmonary: decreased breath sounds bilaterally at bases, poor air movement, no wheezing, rhonchi or rales. Gastrointestinal. Abdomen portuberant no organomegaly, non tender, no rebound or guarding Skin. No rashes Musculoskeletal: no joint deformities     Data Reviewed: I have personally reviewed following labs and imaging studies  CBC: Recent Labs  Lab 05/23/17 0717 05/24/17 0316  WBC 2.5* 2.3*  NEUTROABS  --  1.2*  HGB 12.1 11.5*  HCT 35.3* 33.6*  MCV 87.6 86.8  PLT 23* 66*   Basic Metabolic Panel: Recent Labs  Lab 05/23/17 0717 05/24/17 0316  NA 126* 128*  K 4.0 3.9    CL 92* 91*  CO2 25 27  GLUCOSE 128* 95  BUN 15 16  CREATININE 0.76 0.77  CALCIUM 8.4* 8.2*  MG 1.6*  --   PHOS 4.2  --    GFR: Estimated Creatinine Clearance: 67 mL/min (by C-G formula based on SCr of 0.77 mg/dL). Liver Function Tests: Recent Labs  Lab 05/23/17 0717  AST 47*  ALT 30  ALKPHOS 65  BILITOT 1.3*  PROT 5.7*  ALBUMIN 2.6*   No results for input(s): LIPASE, AMYLASE in the last 168 hours. No results for input(s): AMMONIA in the last 168 hours. Coagulation Profile: No results for input(s): INR, PROTIME in the last 168 hours. Cardiac Enzymes: No results for input(s): CKTOTAL, CKMB, CKMBINDEX, TROPONINI in the last 168 hours. BNP (last 3 results) No results for input(s): PROBNP in the last 8760 hours. HbA1C: No results for input(s): HGBA1C in the last 72 hours. CBG: Recent Labs  Lab 05/23/17 1642 05/23/17 2006 05/24/17 0000 05/24/17 0519 05/24/17 0802  GLUCAP 202* 247* 147* 86 110*   Lipid Profile: No results for input(s): CHOL, HDL, LDLCALC, TRIG, CHOLHDL, LDLDIRECT in the last 72 hours. Thyroid Function Tests: No results for input(s): TSH, T4TOTAL, FREET4, T3FREE, THYROIDAB in the last 72 hours. Anemia Panel: No results for input(s): VITAMINB12, FOLATE, FERRITIN, TIBC, IRON, RETICCTPCT in the last 72 hours.    Radiology Studies: I have reviewed all of the imaging during this hospital visit personally     Scheduled Meds: . furosemide  40 mg Intravenous BID  . insulin aspart  0-15 Units Subcutaneous Q4H  . mouth rinse  15 mL Mouth Rinse BID  . pantoprazole  40 mg Oral Daily  . propranolol  20 mg Oral BID  . QUEtiapine  50 mg Oral QHS  . simvastatin  40 mg Oral QHS   Continuous Infusions: . sodium chloride       LOS: 1 day        Tawni Millers, MD [facility] Pager [phone]

## 2017-05-24 NOTE — Plan of Care (Signed)
  Problem: Education: Goal: Knowledge of General Education information will improve Outcome: Progressing   Problem: Health Behavior/Discharge Planning: Goal: Ability to manage health-related needs will improve Outcome: Progressing   Problem: Clinical Measurements: Goal: Ability to maintain clinical measurements within normal limits will improve Outcome: Progressing   

## 2017-05-25 ENCOUNTER — Encounter (HOSPITAL_COMMUNITY): Payer: Self-pay

## 2017-05-25 DIAGNOSIS — R0602 Shortness of breath: Secondary | ICD-10-CM

## 2017-05-25 LAB — GLUCOSE, CAPILLARY
GLUCOSE-CAPILLARY: 222 mg/dL — AB (ref 65–99)
GLUCOSE-CAPILLARY: 248 mg/dL — AB (ref 65–99)
Glucose-Capillary: 130 mg/dL — ABNORMAL HIGH (ref 65–99)
Glucose-Capillary: 85 mg/dL (ref 65–99)
Glucose-Capillary: 85 mg/dL (ref 65–99)

## 2017-05-25 LAB — BASIC METABOLIC PANEL
ANION GAP: 9 (ref 5–15)
BUN: 16 mg/dL (ref 6–20)
CALCIUM: 7.8 mg/dL — AB (ref 8.9–10.3)
CO2: 32 mmol/L (ref 22–32)
Chloride: 90 mmol/L — ABNORMAL LOW (ref 101–111)
Creatinine, Ser: 0.96 mg/dL (ref 0.44–1.00)
GFR calc Af Amer: >60 mL/min (ref >60)
GFR calc non Af Amer: 55 mL/min — ABNORMAL LOW (ref >60)
GLUCOSE: 88 mg/dL (ref 65–99)
Potassium: 3 mmol/L — ABNORMAL LOW (ref 3.5–5.1)
SODIUM: 131 mmol/L — AB (ref 135–145)

## 2017-05-25 MED ORDER — CALCIUM CARBONATE-VITAMIN D 500-200 MG-UNIT PO TABS
1.0000 | ORAL_TABLET | Freq: Every day | ORAL | Status: DC
  Administered 2017-05-26 – 2017-05-31 (×6): 1 via ORAL
  Filled 2017-05-25 (×6): qty 1

## 2017-05-25 MED ORDER — POTASSIUM CHLORIDE CRYS ER 20 MEQ PO TBCR
40.0000 meq | EXTENDED_RELEASE_TABLET | Freq: Once | ORAL | Status: AC
  Administered 2017-05-25: 40 meq via ORAL
  Filled 2017-05-25: qty 2

## 2017-05-25 MED ORDER — INSULIN ASPART 100 UNIT/ML ~~LOC~~ SOLN
0.0000 [IU] | Freq: Every day | SUBCUTANEOUS | Status: DC
Start: 2017-05-25 — End: 2017-05-31
  Administered 2017-05-26: 2 [IU] via SUBCUTANEOUS
  Administered 2017-05-27: 3 [IU] via SUBCUTANEOUS
  Administered 2017-05-28: 2 [IU] via SUBCUTANEOUS
  Administered 2017-05-29: 3 [IU] via SUBCUTANEOUS
  Administered 2017-05-30: 2 [IU] via SUBCUTANEOUS

## 2017-05-25 MED ORDER — ESCITALOPRAM OXALATE 10 MG PO TABS
10.0000 mg | ORAL_TABLET | Freq: Every day | ORAL | Status: DC
  Administered 2017-05-26 – 2017-05-31 (×6): 10 mg via ORAL
  Filled 2017-05-25 (×6): qty 1

## 2017-05-25 MED ORDER — HYPROMELLOSE (GONIOSCOPIC) 2.5 % OP SOLN
1.0000 [drp] | Freq: Three times a day (TID) | OPHTHALMIC | Status: DC | PRN
  Filled 2017-05-25: qty 15

## 2017-05-25 MED ORDER — ADULT MULTIVITAMIN W/MINERALS CH
1.0000 | ORAL_TABLET | Freq: Every day | ORAL | Status: DC
  Administered 2017-05-26 – 2017-05-31 (×6): 1 via ORAL
  Filled 2017-05-25 (×6): qty 1

## 2017-05-25 MED ORDER — INSULIN ASPART 100 UNIT/ML ~~LOC~~ SOLN
0.0000 [IU] | Freq: Three times a day (TID) | SUBCUTANEOUS | Status: DC
Start: 2017-05-25 — End: 2017-05-31
  Administered 2017-05-26: 3 [IU] via SUBCUTANEOUS
  Administered 2017-05-26 – 2017-05-27 (×2): 8 [IU] via SUBCUTANEOUS
  Administered 2017-05-27: 5 [IU] via SUBCUTANEOUS
  Administered 2017-05-27: 2 [IU] via SUBCUTANEOUS
  Administered 2017-05-28: 8 [IU] via SUBCUTANEOUS
  Administered 2017-05-28: 2 [IU] via SUBCUTANEOUS
  Administered 2017-05-28: 8 [IU] via SUBCUTANEOUS
  Administered 2017-05-29 – 2017-05-30 (×4): 3 [IU] via SUBCUTANEOUS
  Administered 2017-05-30: 5 [IU] via SUBCUTANEOUS
  Administered 2017-05-30 – 2017-05-31 (×2): 3 [IU] via SUBCUTANEOUS
  Administered 2017-05-31: 5 [IU] via SUBCUTANEOUS

## 2017-05-25 MED ORDER — POTASSIUM CHLORIDE CRYS ER 20 MEQ PO TBCR
20.0000 meq | EXTENDED_RELEASE_TABLET | Freq: Two times a day (BID) | ORAL | Status: DC
  Administered 2017-05-26: 20 meq via ORAL
  Filled 2017-05-25: qty 1

## 2017-05-25 NOTE — Plan of Care (Signed)
  Problem: Education: Goal: Knowledge of General Education information will improve Outcome: Progressing   Problem: Health Behavior/Discharge Planning: Goal: Ability to manage health-related needs will improve Outcome: Progressing   Problem: Clinical Measurements: Goal: Ability to maintain clinical measurements within normal limits will improve Outcome: Progressing   

## 2017-05-25 NOTE — Progress Notes (Signed)
[place] TEAM 1 - Stepdown/ICU TEAM  Cheryl Velasquez  HGD:[id number] DOB: [birth year] DOA: 05/23/2017 PCP: Galen Manila, MD    Brief Narrative:  77 year old female w/ a hx of DM2, COPD, CHF, NASH cirrhosis, hepatocellular carcinoma s/p ablation Jan 2018 and March 2019, and a recent hospitalization for a R transudative pleural effusion. She was discharge to a SNF where she developed severe and worsening dyspnea, refractory to diuretics.  In the ED a CXR confirmed a large right pleural effusion.  Significant Events: 4/9 admit 4/9 R thoracentesis - 1.8L clear yellow fluid   Subjective: The patient is sitting up at the bedside eating supper without difficulty.  She denies shortness of breath.  She states she was able to get up and walk around today without shortness of breath.  She denies chest pain nausea vomiting or abdominal pain.  Assessment & Plan:  Recurrent R pleural effusion / Hepatic hydrothorax  improved after thoracentesis - added aldactone - continue furosemide - if rapidly re-accumulates will have to consider TIPS - unfortunately pleural fluid was NOT sent for lab studies to confirm a recurrent transudate - f/u CXR in AM   Chronic liver failure / NASH Cirrhosis / Hepatocellular CA No encephalopathy presently   Acute on chronic diastolic LV Heart failure continue diuresis - follow Is/Os and weights  Filed Weights   05/23/17 0441 05/24/17 0520 05/25/17 0434  Weight: 112.9 kg (248 lb 14.4 oz) 110.8 kg (244 lb 4.3 oz) 107.9 kg (237 lb 14 oz)    Chronic Atrial fibrillation not on anticoagulation due to risk of esophageal varices bleed - rate controlled   Dyslipidemia continue statin therapy  DM2 - controlled w/o complication CBG reasonably controlled - follow   DVT prophylaxis: SCDs Code Status: FULL CODE Family Communication: no family present at time of exam  Disposition Plan:   Consultants:  PCCM  Antimicrobials:  none  Objective: Blood pressure (!)  128/48, pulse 68, temperature 97.7 F (36.5 C), temperature source Oral, resp. rate 18, height 5' 2"  (1.575 m), weight 107.9 kg (237 lb 14 oz), SpO2 96 %.  Intake/Output Summary (Last 24 hours) at 05/25/2017 1631 Last data filed at 05/25/2017 1228 Gross per 24 hour  Intake 480 ml  Output 1800 ml  Net -1320 ml   Filed Weights   05/23/17 0441 05/24/17 0520 05/25/17 0434  Weight: 112.9 kg (248 lb 14.4 oz) 110.8 kg (244 lb 4.3 oz) 107.9 kg (237 lb 14 oz)    Examination: General: No acute respiratory distress Lungs: Clear to auscultation bilaterally without wheezes or crackles Cardiovascular: Regular rate and rhythm without murmur gallop or rub normal S1 and S2 Abdomen: Nontender, nondistended, soft, bowel sounds positive, no rebound, no ascites, no appreciable mass Extremities: No significant cyanosis, clubbing, or edema bilateral lower extremities  CBC: Recent Labs  Lab 05/23/17 0717 05/24/17 0316  WBC 2.5* 2.3*  NEUTROABS  --  1.2*  HGB 12.1 11.5*  HCT 35.3* 33.6*  MCV 87.6 86.8  PLT 23* 66*   Basic Metabolic Panel: Recent Labs  Lab 05/23/17 0717 05/24/17 0316 05/25/17 0233  NA 126* 128* 131*  K 4.0 3.9 3.0*  CL 92* 91* 90*  CO2 25 27 32  GLUCOSE 128* 95 88  BUN 15 16 16   CREATININE 0.76 0.77 0.96  CALCIUM 8.4* 8.2* 7.8*  MG 1.6*  --   --   PHOS 4.2  --   --    GFR: Estimated Creatinine Clearance: 54.9 mL/min (by C-G formula based  on SCr of 0.96 mg/dL).  Liver Function Tests: Recent Labs  Lab 05/23/17 0717  AST 47*  ALT 30  ALKPHOS 65  BILITOT 1.3*  PROT 5.7*  ALBUMIN 2.6*    HbA1C: Hgb A1c MFr Bld  Date/Time Value Ref Range Status  04/20/2017 12:09 PM 9.1 (H) 4.8 - 5.6 % Final    Comment:    (NOTE) Pre diabetes:          5.7%-6.4% Diabetes:              >6.4% Glycemic control for   <7.0% adults with diabetes   03/10/2016 11:00 AM 6.3 (H) 4.8 - 5.6 % Final    Comment:    (NOTE)         Pre-diabetes: 5.7 - 6.4         Diabetes: >6.4          Glycemic control for adults with diabetes: <7.0     CBG: Recent Labs  Lab 05/24/17 2009 05/25/17 0045 05/25/17 0433 05/25/17 0807 05/25/17 1122  GLUCAP 332* 130* 85 85 222*     Scheduled Meds: . famotidine  20 mg Oral Daily  . furosemide  40 mg Intravenous BID  . insulin aspart  0-15 Units Subcutaneous Q4H  . mouth rinse  15 mL Mouth Rinse BID  . propranolol  20 mg Oral BID  . QUEtiapine  50 mg Oral QHS  . simvastatin  40 mg Oral QHS  . spironolactone  50 mg Oral Daily     LOS: 2 days   [name], MD [facility] Office  [phone] Pager - Text Page per Amion as per below:  On-Call/Text Page:      [email or web address]      password [email or web address]  If 7PM-7AM, please contact night-coverage [email or web address] Password [facility] 05/25/2017, 4:31 PM

## 2017-05-25 NOTE — Progress Notes (Signed)
Physical Therapy Treatment Patient Details Name: CHAVONNE SFORZA MRN: [id number] DOB: 1940-03-06 Today's Date: 05/25/2017    History of Present Illness Pt is a 77 y.o. female recently d/c from [facility] after admission for dyspnea and R sided pleural effusion. She went to a rehab center, and on 05/22/17 had SOB that did not improve with lasix and O2 supplementation; admitted to ED 4/9 found to be hypoxic with R pleural effusion. PMH includes OA, R breast CA, dyspnea, HTN, liver cirrhosis with NASH, DM, hernia repair.    PT Comments    Pt slowly progressing with mobility. Amb 80' with RW and intermittent min guard for balance. Pt with decreased ability to perform self-care tasks independently. Increased time spent discussing available assist at home; pt unsure she will have 24/7 support at home, but will discuss this with family this evening. Based on pt's increased fall risk, slowed processing, and inability to independently perform amb/ADLs, do not feel she is safe to return home unless she has initial 24/7 support (may be a candidate for [facility]?). If she will not have 24/7 support, recommend short-term SNF-level therapies to maximize functional mobility and independence.   Follow Up Recommendations  SNF;Home health PT;Supervision/Assistance - 24 hour(HHPT if has 24/7 assist )     Equipment Recommendations  None recommended by PT    Recommendations for Other Services       Precautions / Restrictions Precautions Precautions: Fall Precaution Comments: watch O2 Restrictions Weight Bearing Restrictions: No    Mobility  Bed Mobility Overal bed mobility: Needs Assistance             General bed mobility comments: Received sitting EOB eating breakfast  Transfers Overall transfer level: Needs assistance Equipment used: Rolling walker (2 wheeled) Transfers: Sit to/from Stand Sit to Stand: Min guard         General transfer comment: Min guard for safety; cues for hand placement on  RW  Ambulation/Gait   Ambulation Distance (Feet): 80 Feet Assistive device: Rolling walker (2 wheeled) Gait Pattern/deviations: Step-through pattern;Decreased stride length;Trunk flexed Gait velocity: Decreased Gait velocity interpretation: Below normal speed for age/gender General Gait Details: Slow amb with RW and min guard for balance. SpO2 88-98% on RA   Stairs            Wheelchair Mobility    Modified Rankin (Stroke Patients Only)       Balance Overall balance assessment: Needs assistance   Sitting balance-Leahy Scale: Fair       Standing balance-Leahy Scale: Poor Standing balance comment: Reliant on UE support                            Cognition Arousal/Alertness: Awake/alert Behavior During Therapy: WFL for tasks assessed/performed Overall Cognitive Status: No family/caregiver present to determine baseline cognitive functioning Area of Impairment: Memory;Following commands;Safety/judgement;Awareness;Problem solving                     Memory: Decreased short-term memory Following Commands: Follows multi-step commands inconsistently;Follows one step commands with increased time Safety/Judgement: Decreased awareness of safety Awareness: Emergent Problem Solving: Slow processing;Requires verbal cues General Comments: Intermittent confusion when discussing home set-up. Talking to PT as if I knew all her family members when discussing available assist      Exercises      General Comments        Pertinent Vitals/Pain Pain Assessment: No/denies pain    Home Living  Prior Function            PT Goals (current goals can now be found in the care plan section) Acute Rehab PT Goals Patient Stated Goal: return home PT Goal Formulation: With patient Time For Goal Achievement: 06/07/17 Potential to Achieve Goals: Good Progress towards PT goals: Progressing toward goals    Frequency    Min  3X/week      PT Plan Discharge plan needs to be updated    Co-evaluation              AM-PAC PT "6 Clicks" Daily Activity  Outcome Measure  Difficulty turning over in bed (including adjusting bedclothes, sheets and blankets)?: Unable Difficulty moving from lying on back to sitting on the side of the bed? : Unable Difficulty sitting down on and standing up from a chair with arms (e.g., wheelchair, bedside commode, etc,.)?: A Little Help needed moving to and from a bed to chair (including a wheelchair)?: A Little Help needed walking in hospital room?: A Little Help needed climbing 3-5 steps with a railing? : A Lot 6 Click Score: 13    End of Session Equipment Utilized During Treatment: Gait belt Activity Tolerance: Patient tolerated treatment well Patient left: in chair;with call bell/phone within reach Nurse Communication: Mobility status PT Visit Diagnosis: Muscle weakness (generalized) (M62.81);Difficulty in walking, not elsewhere classified (R26.2)     Time: 3734-2876 PT Time Calculation (min) (ACUTE ONLY): 25 min  Charges:  $Gait Training: 8-22 mins $Therapeutic Activity: 8-22 mins                    G Codes:      Mabeline Caras, PT, DPT Acute Rehab Services  Pager: [place] 05/25/2017, 12:05 PM

## 2017-05-25 NOTE — Clinical Social Work Note (Signed)
Clinical Social Work Assessment  Patient Details  Name: Cheryl Velasquez MRN: [id number] Date of Birth: [birth year]  Date of referral:  05/25/17               Reason for consult:  Discharge Planning, Facility Placement                Permission sought to share information with:  Facility Sport and exercise psychologist, Family Supports Permission granted to share information::  Yes, Verbal Permission Granted  Name::     Cheryl Velasquez  Agency::  snf  Relationship::  niece  Contact Information:  [phone]  Housing/Transportation Living arrangements for the past 2 months:  [place] of Information:  Patient Patient Interpreter Needed:  None Criminal Activity/Legal Involvement Pertinent to Current Situation/Hospitalization:  No - Comment as needed Significant Relationships:  Other Family Members, Neighbor Lives with:  Self Do you feel safe going back to the place where you live?  Yes Need for family participation in patient care:  Yes (Comment)  Care giving concerns:  No family at bedside. Patient stated she lives by herself and has a nephew that lives in the house next door. Patient stated she transferred from a Rehab facility in [place] [facility]) and does not want to return. Patient stated she wants to go home with home health to follow. Patient gave CSW verbal permission to contact niece (linda) to see if she would be agreeable to have patient come home  Social Worker assessment / plan:  CSW met patient at bedside to discuss discharge plan. Patient stated she wants to go home because she did not like the rehab facility she was in. CSW stated to patient she will need to reach out with family to see if they are able to provide 24/hr care at home.  CSW spoke with patients niece Cheryl Velasquez via phone. Cheryl Velasquez stated that she is in the medical field and works 3 12/hr shifts and is unable to be there for patient. Cheryl Velasquez stated that patient does not have a lot of family in the area and the  family she does have will not be able to provide support. Cheryl Velasquez stated if patient goes home she will not have help everyday. CSW encouraged Cheryl Velasquez to talk to patient to try and persuade her to go to rehab. Cheryl Velasquez stated she will be at the hospital tomorrow and will talk to patient.  Employment status:  Retired Forensic scientist:  Medicare PT Recommendations:  Home with [place] / Referral to community resources:  [place]  Patient/Family's Response to care: Patient pleasant during assessment. Patient wanting to go home but does not have support  Patient/Family's Understanding of and Emotional Response to Diagnosis, Current Treatment, and Prognosis:  CSW will continue to follow patient/family for support   Emotional Assessment Appearance:  Appears stated age Attitude/Demeanor/Rapport:  Engaged Affect (typically observed):  Accepting, Appropriate Orientation:  Oriented to Self, Oriented to Place, Oriented to  Time, Oriented to Situation Alcohol / Substance use:  Not Applicable Psych involvement (Current and /or in the community):  No (Comment)  Discharge Needs  Concerns to be addressed:  Care Coordination, Discharge Planning Concerns Readmission within the last 30 days:  Yes Current discharge risk:  Lives alone, Dependent with Mobility Barriers to Discharge:  Continued Medical Work up   [employer], LCSW 05/25/2017, 11:24 AM

## 2017-05-25 NOTE — Progress Notes (Signed)
Inpatient Diabetes Program Recommendations  AACE/ADA: New Consensus Statement on Inpatient Glycemic Control (2015)  Target Ranges:  Prepandial:   less than 140 mg/dL      Peak postprandial:   less than 180 mg/dL (1-2 hours)      Critically ill patients:  140 - 180 mg/dL   Lab Results  Component Value Date   GLUCAP 85 05/25/2017   HGBA1C 9.1 (H) 04/20/2017    Review of Glycemic Control Results for Cheryl Velasquez, Cheryl Velasquez (MRN [id number]) as of 05/25/2017 11:19  Ref. Range 05/24/2017 20:09 05/25/2017 00:45 05/25/2017 04:33 05/25/2017 08:07  Glucose-Capillary Latest Ref Range: 65 - 99 mg/dL 332 (H) 130 (H) 85 85   Diabetes history: Type 2 DM Outpatient Diabetes medications: Novolog 5 units TID, Lantus 20 units QHS Current orders for Inpatient glycemic control: Novolog 0-15 units Q4H  Inpatient Diabetes Program Recommendations:    Appears to be eating >50% of meals. Assuming this, consider decreasing correction to Novolog 0-15 units TIDAC, Novolog 0-5 units QHS, and Novolog 4 units TID.  Thanks, Bronson Curb, MSN, RNC-OB Diabetes Coordinator [phone] (8a-5p)

## 2017-05-25 NOTE — Care Management Note (Signed)
Case Management Note Marvetta Gibbons RN, BSN Unit 4E-Case Manager [phone]  Patient Details  Name: Cheryl Velasquez MRN: [id number] Date of Birth: 11-19-1940  Subjective/Objective:   Pt admitted with Resp. Failure, pl. Effusion, afib                 Action/Plan: PTA pt was at [facility] in [place]- pt does not have 24/7 supervision/assistance for d/c home- has a niece that can assist when pt is more independent as niece works.  CSW following for return to SNF when medically stable.   Expected Discharge Date:                  Expected Discharge Plan:  Skilled Nursing Facility  In-House Referral:  Clinical Social Work  Discharge planning Services  CM Consult  Post Acute Care Choice:    Choice offered to:     DME Arranged:    DME Agency:     HH Arranged:    [place] Agency:     Status of Service:  In process, will continue to follow  If discussed at Long Length of Stay Meetings, dates discussed:    Discharge Disposition:   Additional Comments:  Dawayne Patricia, RN 05/25/2017, 3:46 PM

## 2017-05-26 ENCOUNTER — Inpatient Hospital Stay (HOSPITAL_COMMUNITY): Payer: Medicare Other

## 2017-05-26 DIAGNOSIS — J948 Other specified pleural conditions: Secondary | ICD-10-CM

## 2017-05-26 DIAGNOSIS — K7469 Other cirrhosis of liver: Secondary | ICD-10-CM

## 2017-05-26 LAB — CBC
HEMATOCRIT: 41 % (ref 36.0–46.0)
HEMOGLOBIN: 14.1 g/dL (ref 12.0–15.0)
MCH: 30.9 pg (ref 26.0–34.0)
MCHC: 34.4 g/dL (ref 30.0–36.0)
MCV: 89.9 fL (ref 78.0–100.0)
Platelets: 107 10*3/uL — ABNORMAL LOW (ref 150–400)
RBC: 4.56 MIL/uL (ref 3.87–5.11)
RDW: 15.2 % (ref 11.5–15.5)
WBC: 4.2 10*3/uL (ref 4.0–10.5)

## 2017-05-26 LAB — COMPREHENSIVE METABOLIC PANEL
ALBUMIN: 2.3 g/dL — AB (ref 3.5–5.0)
ALT: 31 U/L (ref 14–54)
AST: 40 U/L (ref 15–41)
Alkaline Phosphatase: 66 U/L (ref 38–126)
Anion gap: 8 (ref 5–15)
BILIRUBIN TOTAL: 1.4 mg/dL — AB (ref 0.3–1.2)
BUN: 13 mg/dL (ref 6–20)
CHLORIDE: 91 mmol/L — AB (ref 101–111)
CO2: 34 mmol/L — AB (ref 22–32)
Calcium: 7.9 mg/dL — ABNORMAL LOW (ref 8.9–10.3)
Creatinine, Ser: 0.9 mg/dL (ref 0.44–1.00)
GFR calc Af Amer: >60 mL/min (ref >60)
GFR calc non Af Amer: 59 mL/min — ABNORMAL LOW (ref >60)
GLUCOSE: 172 mg/dL — AB (ref 65–99)
POTASSIUM: 3.4 mmol/L — AB (ref 3.5–5.1)
SODIUM: 133 mmol/L — AB (ref 135–145)
TOTAL PROTEIN: 5.1 g/dL — AB (ref 6.5–8.1)

## 2017-05-26 LAB — GLUCOSE, CAPILLARY
GLUCOSE-CAPILLARY: 275 mg/dL — AB (ref 65–99)
Glucose-Capillary: 164 mg/dL — ABNORMAL HIGH (ref 65–99)
Glucose-Capillary: 250 mg/dL — ABNORMAL HIGH (ref 65–99)
Glucose-Capillary: 211 mg/dL — ABNORMAL HIGH (ref 65–99)

## 2017-05-26 LAB — MAGNESIUM: Magnesium: 1.5 mg/dL — ABNORMAL LOW (ref 1.7–2.4)

## 2017-05-26 MED ORDER — PROPRANOLOL HCL 10 MG PO TABS
10.0000 mg | ORAL_TABLET | Freq: Two times a day (BID) | ORAL | Status: DC
  Administered 2017-05-26 – 2017-05-29 (×6): 10 mg via ORAL
  Filled 2017-05-26 (×6): qty 1

## 2017-05-26 MED ORDER — QUETIAPINE FUMARATE 25 MG PO TABS
25.0000 mg | ORAL_TABLET | Freq: Every day | ORAL | Status: DC
  Administered 2017-05-26 – 2017-05-30 (×5): 25 mg via ORAL
  Filled 2017-05-26 (×5): qty 1

## 2017-05-26 MED ORDER — SPIRONOLACTONE 25 MG PO TABS
100.0000 mg | ORAL_TABLET | Freq: Every day | ORAL | Status: DC
  Administered 2017-05-27 – 2017-05-31 (×5): 100 mg via ORAL
  Filled 2017-05-26 (×6): qty 4

## 2017-05-26 MED ORDER — FUROSEMIDE 10 MG/ML IJ SOLN
80.0000 mg | Freq: Three times a day (TID) | INTRAMUSCULAR | Status: DC
  Administered 2017-05-26 – 2017-05-30 (×11): 80 mg via INTRAVENOUS
  Filled 2017-05-26 (×11): qty 8

## 2017-05-26 MED ORDER — POTASSIUM CHLORIDE CRYS ER 20 MEQ PO TBCR
40.0000 meq | EXTENDED_RELEASE_TABLET | Freq: Two times a day (BID) | ORAL | Status: DC
  Administered 2017-05-26 – 2017-05-27 (×2): 40 meq via ORAL
  Filled 2017-05-26 (×2): qty 2

## 2017-05-26 MED ORDER — MAGNESIUM SULFATE 2 GM/50ML IV SOLN
2.0000 g | Freq: Once | INTRAVENOUS | Status: AC
  Administered 2017-05-26: 2 g via INTRAVENOUS
  Filled 2017-05-26: qty 50

## 2017-05-26 NOTE — NC FL2 (Signed)
[place] MEDICAID FL2 LEVEL OF CARE SCREENING TOOL     IDENTIFICATION  Patient Name: Cheryl Velasquez Birthdate: 08-Apr-1940 Sex: female Admission Date (Current Location): 05/23/2017  [facility] and [place] Number:  Herbalist and Address:  [facility] [place] [place] [address], [place], [place] [address]      Provider Number: [id number]  Attending Physician Name and Address:  [name], MD  Relative Name and Phone Number:       Current Level of Care: Hospital Recommended Level of Care: [place] Prior Approval Number:    Date Approved/Denied:   PASRR Number: pending  Discharge Plan: SNF    Current Diagnoses: Patient Active Problem List   Diagnosis Date Noted  . Pleural effusion 05/23/2017  . Hypoxemia requiring supplemental oxygen   . Respiratory failure ([place]) 05/03/2017  . Hepatocellular carcinoma ([place]) 03/11/2016  . Esophageal varices in cirrhosis ([place]) 12/22/2015  . Other cirrhosis of liver ([place]) 12/22/2015  . Iron deficiency anemia due to chronic blood loss 10/04/2015  . Breast cancer of upper-outer quadrant of right female breast ([place]) 10/02/2015  . Liver mass   . Congestive splenomegaly 08/04/2014  . Esophageal varices ([place]) 11/20/2012  . Obesity 11/20/2012  . Leukopenia 09/14/2012  . Thrombocytopenia ([place]) 09/14/2012  . B12 deficiency 09/14/2012  . Cirrhosis, non-alcoholic ([place]) 19/37/9024  . Multinodular goiter 01/18/2011  . Arthritis   . Diabetes mellitus   . Hyperlipidemia   . Hypertension   . Hyperthyroidism 01/11/2011    Orientation RESPIRATION BLADDER Height & Weight     Time, Self, Place  Normal Continent, External catheter Weight: 244 lb 11.4 oz (111 kg) Height:  5' 2"  (157.5 cm)  BEHAVIORAL SYMPTOMS/MOOD NEUROLOGICAL BOWEL NUTRITION STATUS      Continent Diet(heart healthy)  AMBULATORY STATUS COMMUNICATION OF NEEDS Skin   Limited Assist Verbally                         Personal Care  Assistance Level of Assistance    Bathing Assistance: Limited assistance Feeding assistance: Independent Dressing Assistance: Limited assistance     Functional Limitations Info  Hearing, Speech, Sight Sight Info: Adequate Hearing Info: Adequate Speech Info: Adequate    SPECIAL CARE FACTORS FREQUENCY  PT (By licensed PT), OT (By licensed OT)     PT Frequency: 5x wk OT Frequency: 5x wk            Contractures Contractures Info: Not present    Additional Factors Info  Code Status, Allergies Code Status Info: full code Allergies Info: No known Allergies           Current Medications (05/26/2017):  This is the current hospital active medication list Current Facility-Administered Medications  Medication Dose Route Frequency Provider Last Rate Last Dose  . calcium-vitamin D (OSCAL WITH D) 500-200 MG-UNIT per tablet 1 tablet  1 tablet Oral Daily [name], MD   1 tablet at 05/26/17 0907  . escitalopram (LEXAPRO) tablet 10 mg  10 mg Oral Daily [name], MD   10 mg at 05/26/17 0907  . famotidine (PEPCID) tablet 20 mg  20 mg Oral Daily Arrien, Jimmy Picket, MD   20 mg at 05/26/17 0907  . furosemide (LASIX) injection 40 mg  40 mg Intravenous BID Alma Friendly, MD   40 mg at 05/26/17 0906  . hydroxypropyl methylcellulose / hypromellose (ISOPTO TEARS / GONIOVISC) 2.5 % ophthalmic solution 1 drop  1  drop Both Eyes TID PRN Joette Catching T, MD      . insulin aspart (novoLOG) injection 0-15 Units  0-15 Units Subcutaneous TID WC [name], MD      . insulin aspart (novoLOG) injection 0-5 Units  0-5 Units Subcutaneous QHS [name], MD      . MEDLINE mouth rinse  15 mL Mouth Rinse BID Tarry Kos, MD   15 mL at 05/25/17 2159  . multivitamin with minerals tablet 1 tablet  1 tablet Oral Daily [name], MD   1 tablet at 05/26/17 0908  . potassium chloride SA (K-DUR,KLOR-CON) CR tablet 20 mEq  20 mEq Oral BID [name], MD   20 mEq at 05/26/17 0908  . propranolol (INDERAL) tablet 20 mg  20 mg Oral BID Shearon Stalls, Rahul P, PA-C   20 mg at 05/26/17 0907  . QUEtiapine (SEROQUEL) tablet 50 mg  50 mg Oral QHS Desai, Rahul P, PA-C   50 mg at 05/25/17 2159  . simvastatin (ZOCOR) tablet 40 mg  40 mg Oral QHS Desai, Rahul P, PA-C   40 mg at 05/25/17 2159  . spironolactone (ALDACTONE) tablet 50 mg  50 mg Oral Daily Arrien, Jimmy Picket, MD   50 mg at 05/26/17 3846     Discharge Medications: Please see discharge summary for a list of discharge medications.  Relevant Imaging Results:  Relevant Lab Results:   Additional Information SS# [place], [place]

## 2017-05-26 NOTE — Progress Notes (Signed)
[place] TEAM 1 - Stepdown/ICU TEAM  LATAJA NEWLAND  PIR:[id number] DOB: 1940/07/12 DOA: 05/23/2017 PCP: Galen Manila, MD    Brief Narrative:  77 year old female w/ a hx of DM2, COPD, CHF, NASH cirrhosis, hepatocellular carcinoma s/p ablation Jan 2018 and March 2019, and a recent hospitalization for a R transudative pleural effusion. She was discharge to a SNF where she developed severe dyspnea, refractory to diuretics.  In the ED a CXR confirmed a large right pleural effusion.  Significant Events: 4/9 admit 4/9 R thoracentesis - 1.8L clear yellow fluid   Subjective: The patient is sitting up in a bedside chair.  She denies shortness of breath at the present time.  She denies chest pain nausea or vomiting.  She is growing tired of being in the hospital.  With her permission I have spoken at length with her niece at the bedside.  Her niece Vaughan Basta is her primary caregiver.  Assessment & Plan:  Recurrent R pleural effusion / Hepatic hydrothorax  improved after thoracentesis - added aldactone - continue furosemide - if rapidly re-accumulates will have to consider TIPS - unfortunately pleural fluid was NOT sent for lab studies to confirm a recurrent transudate - f/u CXR today suggests fluid has reaccumulated to a moderate extent thus far - increase diuresis as crt and BP will allow   Chronic liver failure / NASH Cirrhosis / Hepatocellular CA No encephalopathy presently - calculate Childs/Pugh in AM   Acute on chronic diastolic LV Heart failure continue diuresis - follow Is/Os and weights  Filed Weights   05/24/17 0520 05/25/17 0434 05/26/17 0352  Weight: 110.8 kg (244 lb 4.3 oz) 107.9 kg (237 lb 14 oz) 111 kg (244 lb 11.4 oz)    Chronic Atrial fibrillation not on anticoagulation due to risk of esophageal varices bleed - rate controlled   Dyslipidemia discontinue statin therapy in setting of signif liver disease and shortened life expectancy   DM2 - controlled w/o  complication CBG quite variable - follow w/o change in tx today    DVT prophylaxis: SCDs Code Status: FULL CODE Family Communication: spoke w/ niece at bedside at length  Disposition Plan: tele bed   Consultants:  PCCM  Antimicrobials:  none  Objective: Blood pressure 114/66, pulse 73, temperature 97.9 F (36.6 C), temperature source Oral, resp. rate 20, height 5' 2"  (1.575 m), weight 111 kg (244 lb 11.4 oz), SpO2 97 %.  Intake/Output Summary (Last 24 hours) at 05/26/2017 1520 Last data filed at 05/26/2017 1100 Gross per 24 hour  Intake 120 ml  Output 3275 ml  Net -3155 ml   Filed Weights   05/24/17 0520 05/25/17 0434 05/26/17 0352  Weight: 110.8 kg (244 lb 4.3 oz) 107.9 kg (237 lb 14 oz) 111 kg (244 lb 11.4 oz)    Examination: General: No acute respiratory distress - A&O  Lungs: poor air movement in R base - no wheezing  Cardiovascular: RRR - no M or rub  Abdomen: Nontender, protuberant, soft, no rebound, suspected ascites  Extremities: 2+ B LE edema   CBC: Recent Labs  Lab 05/23/17 0717 05/24/17 0316 05/26/17 1336  WBC 2.5* 2.3* 4.2  NEUTROABS  --  1.2*  --   HGB 12.1 11.5* 14.1  HCT 35.3* 33.6* 41.0  MCV 87.6 86.8 89.9  PLT 23* 66* PENDING   Basic Metabolic Panel: Recent Labs  Lab 05/23/17 0717 05/24/17 0316 05/25/17 0233 05/26/17 0413  NA 126* 128* 131* 133*  K 4.0 3.9 3.0* 3.4*  CL  92* 91* 90* 91*  CO2 25 27 32 34*  GLUCOSE 128* 95 88 172*  BUN 15 16 16 13   CREATININE 0.76 0.77 0.96 0.90  CALCIUM 8.4* 8.2* 7.8* 7.9*  MG 1.6*  --   --  1.5*  PHOS 4.2  --   --   --    GFR: Estimated Creatinine Clearance: 59.6 mL/min (by C-G formula based on SCr of 0.9 mg/dL).  Liver Function Tests: Recent Labs  Lab 05/23/17 0717 05/26/17 0413  AST 47* 40  ALT 30 31  ALKPHOS 65 66  BILITOT 1.3* 1.4*  PROT 5.7* 5.1*  ALBUMIN 2.6* 2.3*    HbA1C: Hgb A1c MFr Bld  Date/Time Value Ref Range Status  04/20/2017 12:09 PM 9.1 (H) 4.8 - 5.6 % Final     Comment:    (NOTE) Pre diabetes:          5.7%-6.4% Diabetes:              >6.4% Glycemic control for   <7.0% adults with diabetes   03/10/2016 11:00 AM 6.3 (H) 4.8 - 5.6 % Final    Comment:    (NOTE)         Pre-diabetes: 5.7 - 6.4         Diabetes: >6.4         Glycemic control for adults with diabetes: <7.0     CBG: Recent Labs  Lab 05/25/17 0807 05/25/17 1122 05/25/17 2055 05/26/17 0555 05/26/17 1222  GLUCAP 85 222* 248* 164* 250*     Scheduled Meds: . calcium-vitamin D  1 tablet Oral Daily  . escitalopram  10 mg Oral Daily  . famotidine  20 mg Oral Daily  . furosemide  40 mg Intravenous BID  . insulin aspart  0-15 Units Subcutaneous TID WC  . insulin aspart  0-5 Units Subcutaneous QHS  . mouth rinse  15 mL Mouth Rinse BID  . multivitamin with minerals  1 tablet Oral Daily  . potassium chloride  20 mEq Oral BID  . propranolol  20 mg Oral BID  . QUEtiapine  50 mg Oral QHS  . simvastatin  40 mg Oral QHS  . spironolactone  50 mg Oral Daily     LOS: 3 days   [name], MD [facility] Office  [phone] Pager - Text Page per Amion as per below:  On-Call/Text Page:      [email or web address]      password [email or web address]  If 7PM-7AM, please contact night-coverage [email or web address] Password [email or web address] 05/26/2017, 3:20 PM

## 2017-05-26 NOTE — Clinical Social Work Note (Signed)
30 day note on front of chart for MD to sign. CSW paged MD to notify.  Cheryl Velasquez, [place]

## 2017-05-26 NOTE — Evaluation (Signed)
Occupational Therapy Evaluation Patient Details Name: Cheryl Velasquez MRN: [id number] DOB: 18-May-1940 Today's Date: 05/26/2017    History of Present Illness Pt is a 77 y.o. female recently d/c from [facility] after admission for dyspnea and R sided pleural effusion. She went to a rehab center, and on 05/22/17 had SOB that did not improve with lasix and O2 supplementation; admitted to ED 4/9 found to be hypoxic with R pleural effusion. PMH includes OA, R breast CA, dyspnea, HTN, liver cirrhosis with NASH, DM, hernia repair.    Clinical Impression   PTA Pt at SNF working with OT/PT. Prior to that Pt was independent in ADL and mobility - driving, attending her church etc. Pt is currently mod A for LB dressing, min guard for transfers and using RW for mobility to and from bathroom. Pt will require skilled OT in the acute setting and afterwards at the SNF level to facilitate return to PLOF. Next session to focus on energy conservation, and potentially AE for LB ADL.      Follow Up Recommendations  SNF;Supervision/Assistance - 24 hour    Equipment Recommendations  None recommended by OT    Recommendations for Other Services       Precautions / Restrictions Precautions Precautions: Fall Precaution Comments: watch O2 Restrictions Weight Bearing Restrictions: No      Mobility Bed Mobility Overal bed mobility: Needs Assistance Bed Mobility: Sit to Supine       Sit to supine: Min assist      Transfers Overall transfer level: Needs assistance Equipment used: Rolling walker (2 wheeled) Transfers: Sit to/from Stand Sit to Stand: Min guard         General transfer comment: Min guard for safety; cues for hand placement on RW    Balance Overall balance assessment: Needs assistance Sitting-balance support: No upper extremity supported;Feet supported Sitting balance-Leahy Scale: Fair     Standing balance support: Bilateral upper extremity supported;During functional activity Standing  balance-Leahy Scale: Poor Standing balance comment: Reliant on UE support                           ADL either performed or assessed with clinical judgement   ADL Overall ADL's : Needs assistance/impaired Eating/Feeding: Modified independent   Grooming: Min guard;Standing;Wash/dry hands;Wash/dry face Grooming Details (indicate cue type and reason): sink level Upper Body Bathing: Set up;Supervision/ safety;Sitting   Lower Body Bathing: Sit to/from stand;Moderate assistance   Upper Body Dressing : Minimal assistance;Sitting   Lower Body Dressing: Moderate assistance;Sit to/from stand   Toilet Transfer: Min guard;Ambulation;RW   Toileting- Clothing Manipulation and Hygiene: Moderate assistance;Sit to/from stand Toileting - Clothing Manipulation Details (indicate cue type and reason): for peri care     Functional mobility during ADLs: Min guard;Rolling walker       Vision Patient Visual Report: No change from baseline Vision Assessment?: No apparent visual deficits     Perception     Praxis      Pertinent Vitals/Pain Pain Assessment: No/denies pain     Hand Dominance     Extremity/Trunk Assessment Upper Extremity Assessment Upper Extremity Assessment: Generalized weakness   Lower Extremity Assessment Lower Extremity Assessment: Generalized weakness   Cervical / Trunk Assessment Cervical / Trunk Assessment: Kyphotic   Communication Communication Communication: No difficulties   Cognition Arousal/Alertness: Awake/alert Behavior During Therapy: WFL for tasks assessed/performed;Flat affect Overall Cognitive Status: Impaired/Different from baseline Area of Impairment: Memory;Following commands;Safety/judgement;Awareness;Problem solving  Memory: Decreased short-term memory Following Commands: Follows multi-step commands inconsistently;Follows one step commands with increased time Safety/Judgement: Decreased awareness of  safety Awareness: Emergent Problem Solving: Slow processing;Requires verbal cues General Comments: Niece present throughout session. Pt inconsistent with flat affect throughout session   General Comments  VSS throughout - O2 remained above 94% on RA during session    Exercises     Shoulder Instructions      Home Living Family/patient expects to be discharged to:: Private residence Living Arrangements: Alone Available Help at Discharge: Family;Friend(s);Available PRN/intermittently Type of Home: House Home Access: Stairs to enter Entrance Stairs-Number of Steps: 1+1   Home Layout: One level     Bathroom Shower/Tub: Tub/shower unit;Curtain   Biochemist, clinical: Standard     Home Equipment: Environmental consultant - 2 wheels;Cane - single point;Shower seat;Wheelchair - manual   Additional Comments: equipment and PLOF in part taken from prior charting, as patient not very clear on some aspects of her home today       Prior Functioning/Environment Level of Independence: Independent with assistive device(s)        Comments: Occasional use of RW, neighbor brings her groceries        OT Problem List: Decreased strength;Decreased activity tolerance;Impaired balance (sitting and/or standing);Decreased knowledge of use of DME or AE;Cardiopulmonary status limiting activity;Obesity      OT Treatment/Interventions: Self-care/ADL training;Therapeutic exercise;Energy conservation;DME and/or AE instruction;Therapeutic activities;Patient/family education;Balance training    OT Goals(Current goals can be found in the care plan section) Acute Rehab OT Goals Patient Stated Goal: get back to independent OT Goal Formulation: With patient/family Time For Goal Achievement: 06/09/17 Potential to Achieve Goals: Good ADL Goals Pt Will Perform Grooming: with supervision;standing Pt Will Perform Lower Body Bathing: with set-up;with adaptive equipment;sit to/from stand Pt Will Perform Lower Body Dressing: with  set-up;with adaptive equipment;sit to/from stand Pt Will Transfer to Toilet: with modified independence;ambulating Pt Will Perform Toileting - Clothing Manipulation and hygiene: sit to/from stand;with supervision Additional ADL Goal #1: Pt will perform bed mobility at supervision level prior to engaging in ADL activity Additional ADL Goal #2: Pt will independently recall 3 energy conservation techniques and utilize during ADL routine  OT Frequency: Min 2X/week   Barriers to D/C: Decreased caregiver support  Pt lives alone       Co-evaluation              AM-PAC PT "6 Clicks" Daily Activity     Outcome Measure Help from another person eating meals?: None Help from another person taking care of personal grooming?: A Little Help from another person toileting, which includes using toliet, bedpan, or urinal?: A Lot Help from another person bathing (including washing, rinsing, drying)?: A Lot Help from another person to put on and taking off regular upper body clothing?: A Little Help from another person to put on and taking off regular lower body clothing?: A Lot 6 Click Score: 16   End of Session Equipment Utilized During Treatment: Rolling walker;Gait belt Nurse Communication: Mobility status  Activity Tolerance: Patient tolerated treatment well Patient left: in chair;with call bell/phone within reach;with family/visitor present  OT Visit Diagnosis: Unsteadiness on feet (R26.81);Muscle weakness (generalized) (M62.81)                Time: 0102-7253 OT Time Calculation (min): 40 min Charges:  OT General Charges $OT Visit: 1 Visit OT Evaluation $OT Eval Moderate Complexity: 1 Mod OT Treatments $Self Care/Home Management : 8-22 mins $Therapeutic Activity: 8-22 mins G-Codes:  Hulda Humphrey OTR/L [place] 05/26/2017, 5:41 PM

## 2017-05-27 LAB — CBC
HCT: 35.9 % — ABNORMAL LOW (ref 36.0–46.0)
Hemoglobin: 12.2 g/dL (ref 12.0–15.0)
MCH: 30 pg (ref 26.0–34.0)
MCHC: 34 g/dL (ref 30.0–36.0)
MCV: 88.4 fL (ref 78.0–100.0)
PLATELETS: 50 10*3/uL — AB (ref 150–400)
RBC: 4.06 MIL/uL (ref 3.87–5.11)
RDW: 14.8 % (ref 11.5–15.5)
WBC: 3.1 10*3/uL — AB (ref 4.0–10.5)

## 2017-05-27 LAB — GLUCOSE, CAPILLARY
GLUCOSE-CAPILLARY: 150 mg/dL — AB (ref 65–99)
GLUCOSE-CAPILLARY: 239 mg/dL — AB (ref 65–99)
Glucose-Capillary: 261 mg/dL — ABNORMAL HIGH (ref 65–99)

## 2017-05-27 LAB — COMPREHENSIVE METABOLIC PANEL
ALBUMIN: 2.4 g/dL — AB (ref 3.5–5.0)
ALT: 32 U/L (ref 14–54)
AST: 44 U/L — AB (ref 15–41)
Alkaline Phosphatase: 69 U/L (ref 38–126)
Anion gap: 10 (ref 5–15)
BILIRUBIN TOTAL: 1.2 mg/dL (ref 0.3–1.2)
BUN: 11 mg/dL (ref 6–20)
CHLORIDE: 90 mmol/L — AB (ref 101–111)
CO2: 32 mmol/L (ref 22–32)
Calcium: 7.7 mg/dL — ABNORMAL LOW (ref 8.9–10.3)
Creatinine, Ser: 0.76 mg/dL (ref 0.44–1.00)
GFR calc Af Amer: >60 mL/min (ref >60)
GFR calc non Af Amer: >60 mL/min (ref >60)
GLUCOSE: 173 mg/dL — AB (ref 65–99)
POTASSIUM: 3.2 mmol/L — AB (ref 3.5–5.1)
SODIUM: 132 mmol/L — AB (ref 135–145)
TOTAL PROTEIN: 5.4 g/dL — AB (ref 6.5–8.1)

## 2017-05-27 LAB — PROTIME-INR
INR: 1.32
Prothrombin Time: 16.3 seconds — ABNORMAL HIGH (ref 11.4–15.2)

## 2017-05-27 LAB — MAGNESIUM: MAGNESIUM: 1.7 mg/dL (ref 1.7–2.4)

## 2017-05-27 MED ORDER — POTASSIUM CHLORIDE CRYS ER 20 MEQ PO TBCR
40.0000 meq | EXTENDED_RELEASE_TABLET | Freq: Three times a day (TID) | ORAL | Status: DC
  Administered 2017-05-27 – 2017-05-29 (×5): 40 meq via ORAL
  Filled 2017-05-27 (×5): qty 2

## 2017-05-27 NOTE — Progress Notes (Signed)
Cheryl Velasquez - Stepdown/ICU TEAM  NIKIE CID  VVO:[id number] DOB: 05-01-1940 DOA: 05/23/2017 PCP: Galen Manila, MD    Brief Narrative:  77 year old female w/ a hx of DM2, COPD, CHF, NASH cirrhosis, hepatocellular carcinoma s/p ablation Jan 2018 and March 2019, and a recent hospitalization for a R transudative pleural effusion. She was discharge to a SNF where she developed severe dyspnea, refractory to diuretics.  In the ED a CXR confirmed a large right pleural effusion.  Significant Events: 4/9 admit 4/9 R thoracentesis - Velasquez.8L clear yellow fluid   Subjective: Feeling good today.  Denies cp, sob, n/v, or abdom pain.  No dizziness or other orthostatic sx.    Assessment & Plan:  Recurrent R pleural effusion / Hepatic hydrothorax  improved after thoracentesis - added aldactone - continue furosemide - if rapidly re-accumulates will have to consider TIPS - unfortunately pleural fluid was NOT sent for lab studies to confirm a recurrent transudate - f/u CXR 4/12 suggested fluid has reaccumulated to a moderate extent already - increasde diuresis markedly on 4/12 - tolerating thus far - plan for f/u CXR 4/15  Chronic liver failure / NASH Cirrhosis / Hepatocellular CA No encephalopathy presently - Childs/Pugh 7-8 = Class B   Acute on chronic diastolic LV Heart failure continue diuresis - follow Is/Os and weights  Filed Weights   05/25/17 0434 05/26/17 0352 05/27/17 0526  Weight: 107.9 kg (237 lb 14 oz) 111 kg (244 lb 11.4 oz) 105.Velasquez kg (231 lb 11.3 oz)    Chronic Atrial fibrillation not on anticoagulation due to risk of esophageal varices bleed - rate controlled   Dyslipidemia discontinue statin therapy in setting of signif liver disease and shortened life expectancy   DM2 - controlled w/o complication CBG quite variable - follow w/o change again for now    DVT prophylaxis: SCDs Code Status: FULL CODE Family Communication: spoke w/ niece at bedside Disposition Plan:  hopeful for d/c Tues/Wed if pleural effusion responds favorably to increased diuretic dose  Consultants:  PCCM  Antimicrobials:  none  Objective: Blood pressure (!) 105/50, pulse (!) 59, temperature 98 F (36.7 C), temperature source Oral, resp. rate 16, height 5' 2"  (Velasquez.575 m), weight 105.Velasquez kg (231 lb 11.3 oz), SpO2 94 %.  Intake/Output Summary (Last 24 hours) at 05/27/2017 1606 Last data filed at 05/27/2017 1441 Gross per 24 hour  Intake 50 ml  Output 4100 ml  Net -4050 ml   Filed Weights   05/25/17 0434 05/26/17 0352 05/27/17 0526  Weight: 107.9 kg (237 lb 14 oz) 111 kg (244 lb 11.4 oz) 105.Velasquez kg (231 lb 11.3 oz)    Examination: General: No acute respiratory distress Lungs: poor air movement in R base w/o signif change   Cardiovascular: RRR  Abdomen: Nontender, protuberant, soft, no rebound, suspected ascites  Extremities: 2+ B LE edema w/o signif change   CBC: Recent Labs  Lab 05/24/17 0316 05/26/17 1336 05/27/17 0250  WBC 2.3* 4.2 3.Velasquez*  NEUTROABS Velasquez.2*  --   --   HGB 11.5* 14.Velasquez 12.2  HCT 33.6* 41.0 35.9*  MCV 86.8 89.9 88.4  PLT 66* 107* 50*   Basic Metabolic Panel: Recent Labs  Lab 05/23/17 0717  05/25/17 0233 05/26/17 0413 05/27/17 0250  NA 126*   < > 131* 133* 132*  K 4.0   < > 3.0* 3.4* 3.2*  CL 92*   < > 90* 91* 90*  CO2 25   < > 32 34* 32  GLUCOSE  128*   < > 88 172* 173*  BUN 15   < > 16 13 11   CREATININE 0.76   < > 0.96 0.90 0.76  CALCIUM 8.4*   < > 7.8* 7.9* 7.7*  MG Velasquez.6*  --   --  Velasquez.5* Velasquez.7  PHOS 4.2  --   --   --   --    < > = values in this interval not displayed.   GFR: Estimated Creatinine Clearance: 64.9 mL/min (by C-G formula based on SCr of 0.76 mg/dL).  Liver Function Tests: Recent Labs  Lab 05/23/17 0717 05/26/17 0413 05/27/17 0250  AST 47* 40 44*  ALT 30 31 32  ALKPHOS 65 66 69  BILITOT Velasquez.3* Velasquez.4* Velasquez.2  PROT 5.7* 5.Velasquez* 5.4*  ALBUMIN 2.6* 2.3* 2.4*    HbA1C: Hgb A1c MFr Bld  Date/Time Value Ref Range Status  04/20/2017  12:09 PM 9.Velasquez (H) 4.8 - 5.6 % Final    Comment:    (NOTE) Pre diabetes:          5.7%-6.4% Diabetes:              >6.4% Glycemic control for   <7.0% adults with diabetes   03/10/2016 11:00 AM 6.3 (H) 4.8 - 5.6 % Final    Comment:    (NOTE)         Pre-diabetes: 5.7 - 6.4         Diabetes: >6.4         Glycemic control for adults with diabetes: <7.0     CBG: Recent Labs  Lab 05/26/17 1222 05/26/17 1649 05/26/17 2110 05/27/17 0621 05/27/17 1132  GLUCAP 250* 275* 211* 150* 239*     Scheduled Meds: . calcium-vitamin D  Velasquez tablet Oral Daily  . escitalopram  10 mg Oral Daily  . famotidine  20 mg Oral Daily  . furosemide  80 mg Intravenous Q8H  . insulin aspart  0-15 Units Subcutaneous TID WC  . insulin aspart  0-5 Units Subcutaneous QHS  . mouth rinse  15 mL Mouth Rinse BID  . multivitamin with minerals  Velasquez tablet Oral Daily  . potassium chloride  40 mEq Oral BID  . propranolol  10 mg Oral BID  . QUEtiapine  25 mg Oral QHS  . simvastatin  40 mg Oral QHS  . spironolactone  100 mg Oral Daily     LOS: 4 days   [name], MD [facility] Office  [phone] Pager - Text Page per Amion as per below:  On-Call/Text Page:      [email or web address]      password [email or web address]  If 7PM-7AM, please contact night-coverage [email or web address] Password [email or web address] 05/27/2017, 4:06 PM

## 2017-05-28 LAB — GLUCOSE, CAPILLARY
GLUCOSE-CAPILLARY: 128 mg/dL — AB (ref 65–99)
GLUCOSE-CAPILLARY: 285 mg/dL — AB (ref 65–99)
Glucose-Capillary: 287 mg/dL — ABNORMAL HIGH (ref 65–99)
Glucose-Capillary: 218 mg/dL — ABNORMAL HIGH (ref 65–99)

## 2017-05-28 LAB — BASIC METABOLIC PANEL
Anion gap: 10 (ref 5–15)
BUN: 13 mg/dL (ref 6–20)
CHLORIDE: 91 mmol/L — AB (ref 101–111)
CO2: 31 mmol/L (ref 22–32)
CREATININE: 0.81 mg/dL (ref 0.44–1.00)
Calcium: 7.7 mg/dL — ABNORMAL LOW (ref 8.9–10.3)
GFR calc Af Amer: >60 mL/min (ref >60)
GFR calc non Af Amer: >60 mL/min (ref >60)
GLUCOSE: 131 mg/dL — AB (ref 65–99)
POTASSIUM: 3.7 mmol/L (ref 3.5–5.1)
Sodium: 132 mmol/L — ABNORMAL LOW (ref 135–145)

## 2017-05-28 LAB — MAGNESIUM: Magnesium: 1.6 mg/dL — ABNORMAL LOW (ref 1.7–2.4)

## 2017-05-28 MED ORDER — MAGNESIUM SULFATE 2 GM/50ML IV SOLN
2.0000 g | Freq: Once | INTRAVENOUS | Status: AC
  Administered 2017-05-28: 2 g via INTRAVENOUS
  Filled 2017-05-28: qty 50

## 2017-05-28 NOTE — Progress Notes (Signed)
[place] TEAM 1 - Stepdown/ICU TEAM  Cheryl Velasquez  YNW:[id number] DOB: 08/02/1940 DOA: 05/23/2017 PCP: Galen Manila, MD    Brief Narrative:  77 year old female w/ a hx of DM2, COPD, CHF, NASH cirrhosis, hepatocellular carcinoma s/p ablation Jan 2018 and March 2019, and a recent hospitalization for a R transudative pleural effusion. She was discharge to a SNF where she developed severe dyspnea, refractory to diuretics.  In the ED a CXR confirmed a large right pleural effusion.  Significant Events: 4/9 admit 4/9 R thoracentesis - 1.8L clear yellow fluid   Subjective: Saturations remaining stable.  Renal function stable.  Net negative ~3L over the last 24hrs.  No new complaints.  Resting comfortably in a bedside chair.    Assessment & Plan:  Recurrent R pleural effusion / Hepatic hydrothorax  improved after thoracentesis - added aldactone - continue furosemide - if rapidly re-accumulates will have to consider TIPS - unfortunately pleural fluid was NOT sent for lab studies to confirm a recurrent transudate - f/u CXR 4/12 suggested fluid has reaccumulated to a moderate extent already - increased diuresis markedly on 4/12 - tolerating thus far - plan for f/u CXR 4/15  Chronic liver failure / NASH Cirrhosis / Hepatocellular CA No encephalopathy presently - Childs/Pugh 7-8 = Class B / MELD is 10  Acute on chronic diastolic LV Heart failure continue diuresis - follow Is/Os and weights  Filed Weights   05/26/17 0352 05/27/17 0526 05/28/17 0510  Weight: 111 kg (244 lb 11.4 oz) 105.1 kg (231 lb 11.3 oz) 101.7 kg (224 lb 3.3 oz)    Chronic Atrial fibrillation not on anticoagulation due to risk of esophageal varices bleed - rate controlled   Dyslipidemia discontinue statin therapy in setting of signif liver disease and shortened life expectancy   DM2 - controlled w/o complication CBG remains quite variable - follow w/o change for now    DVT prophylaxis: SCDs Code Status: FULL  CODE Family Communication: spoke w/ extended family at bedside  Disposition Plan: hopeful for d/c Tues/Wed if pleural effusion responds favorably to increased diuretic dose  Consultants:  PCCM  Antimicrobials:  none  Objective: Blood pressure 118/60, pulse 82, temperature 97.8 F (36.6 C), temperature source Oral, resp. rate 18, height 5' 2"  (1.575 m), weight 101.7 kg (224 lb 3.3 oz), SpO2 98 %.  Intake/Output Summary (Last 24 hours) at 05/28/2017 1155 Last data filed at 05/28/2017 0920 Gross per 24 hour  Intake 560 ml  Output 4200 ml  Net -3640 ml   Filed Weights   05/26/17 0352 05/27/17 0526 05/28/17 0510  Weight: 111 kg (244 lb 11.4 oz) 105.1 kg (231 lb 11.3 oz) 101.7 kg (224 lb 3.3 oz)    Examination: General: No acute respiratory distress Lungs: poor air movement in R base - no wheezing or crackles    Cardiovascular: RRR - no M  Abdomen: Nontender, protuberant, soft, no rebound, suspected ascites  Extremities: 1+ B LE edema w/o signif change   CBC: Recent Labs  Lab 05/24/17 0316 05/26/17 1336 05/27/17 0250  WBC 2.3* 4.2 3.1*  NEUTROABS 1.2*  --   --   HGB 11.5* 14.1 12.2  HCT 33.6* 41.0 35.9*  MCV 86.8 89.9 88.4  PLT 66* 107* 50*   Basic Metabolic Panel: Recent Labs  Lab 05/23/17 0717  05/26/17 0413 05/27/17 0250 05/28/17 0418  NA 126*   < > 133* 132* 132*  K 4.0   < > 3.4* 3.2* 3.7  CL 92*   < >  91* 90* 91*  CO2 25   < > 34* 32 31  GLUCOSE 128*   < > 172* 173* 131*  BUN 15   < > 13 11 13   CREATININE 0.76   < > 0.90 0.76 0.81  CALCIUM 8.4*   < > 7.9* 7.7* 7.7*  MG 1.6*  --  1.5* 1.7 1.6*  PHOS 4.2  --   --   --   --    < > = values in this interval not displayed.   GFR: Estimated Creatinine Clearance: 62.9 mL/min (by C-G formula based on SCr of 0.81 mg/dL).  Liver Function Tests: Recent Labs  Lab 05/23/17 0717 05/26/17 0413 05/27/17 0250  AST 47* 40 44*  ALT 30 31 32  ALKPHOS 65 66 69  BILITOT 1.3* 1.4* 1.2  PROT 5.7* 5.1* 5.4*   ALBUMIN 2.6* 2.3* 2.4*    HbA1C: Hgb A1c MFr Bld  Date/Time Value Ref Range Status  04/20/2017 12:09 PM 9.1 (H) 4.8 - 5.6 % Final    Comment:    (NOTE) Pre diabetes:          5.7%-6.4% Diabetes:              >6.4% Glycemic control for   <7.0% adults with diabetes   03/10/2016 11:00 AM 6.3 (H) 4.8 - 5.6 % Final    Comment:    (NOTE)         Pre-diabetes: 5.7 - 6.4         Diabetes: >6.4         Glycemic control for adults with diabetes: <7.0     CBG: Recent Labs  Lab 05/27/17 0621 05/27/17 1132 05/27/17 1620 05/28/17 0615 05/28/17 1127  GLUCAP 150* 239* 261* 128* 287*     Scheduled Meds: . calcium-vitamin D  1 tablet Oral Daily  . escitalopram  10 mg Oral Daily  . famotidine  20 mg Oral Daily  . furosemide  80 mg Intravenous Q8H  . insulin aspart  0-15 Units Subcutaneous TID WC  . insulin aspart  0-5 Units Subcutaneous QHS  . mouth rinse  15 mL Mouth Rinse BID  . multivitamin with minerals  1 tablet Oral Daily  . potassium chloride  40 mEq Oral TID  . propranolol  10 mg Oral BID  . QUEtiapine  25 mg Oral QHS  . simvastatin  40 mg Oral QHS  . spironolactone  100 mg Oral Daily     LOS: 5 days   [name], MD [facility] Office  [phone] Pager - Text Page per Amion as per below:  On-Call/Text Page:      [email or web address]      password [email or web address]  If 7PM-7AM, please contact night-coverage [email or web address] Password [facility] 05/28/2017, 11:55 AM

## 2017-05-29 ENCOUNTER — Inpatient Hospital Stay (HOSPITAL_COMMUNITY): Payer: Medicare Other

## 2017-05-29 LAB — BASIC METABOLIC PANEL
ANION GAP: 11 (ref 5–15)
BUN: 11 mg/dL (ref 6–20)
CALCIUM: 8 mg/dL — AB (ref 8.9–10.3)
CO2: 30 mmol/L (ref 22–32)
Chloride: 93 mmol/L — ABNORMAL LOW (ref 101–111)
Creatinine, Ser: 0.82 mg/dL (ref 0.44–1.00)
GLUCOSE: 154 mg/dL — AB (ref 65–99)
POTASSIUM: 4.2 mmol/L (ref 3.5–5.1)
SODIUM: 134 mmol/L — AB (ref 135–145)

## 2017-05-29 LAB — CBC
HEMATOCRIT: 38.2 % (ref 36.0–46.0)
HEMOGLOBIN: 12.6 g/dL (ref 12.0–15.0)
MCH: 29.9 pg (ref 26.0–34.0)
MCHC: 33 g/dL (ref 30.0–36.0)
MCV: 90.5 fL (ref 78.0–100.0)
Platelets: 51 10*3/uL — ABNORMAL LOW (ref 150–400)
RBC: 4.22 MIL/uL (ref 3.87–5.11)
RDW: 15.1 % (ref 11.5–15.5)
WBC: 3.3 10*3/uL — AB (ref 4.0–10.5)

## 2017-05-29 LAB — MAGNESIUM: Magnesium: 1.8 mg/dL (ref 1.7–2.4)

## 2017-05-29 LAB — GLUCOSE, CAPILLARY
GLUCOSE-CAPILLARY: 183 mg/dL — AB (ref 65–99)
GLUCOSE-CAPILLARY: 156 mg/dL — AB (ref 65–99)
GLUCOSE-CAPILLARY: 184 mg/dL — AB (ref 65–99)
GLUCOSE-CAPILLARY: 173 mg/dL — AB (ref 65–99)
Glucose-Capillary: 276 mg/dL — ABNORMAL HIGH (ref 65–99)

## 2017-05-29 MED ORDER — PROPRANOLOL HCL 10 MG PO TABS
5.0000 mg | ORAL_TABLET | Freq: Two times a day (BID) | ORAL | Status: DC
  Administered 2017-05-29 – 2017-05-31 (×4): 5 mg via ORAL
  Filled 2017-05-29 (×4): qty 0.5

## 2017-05-29 MED ORDER — POTASSIUM CHLORIDE CRYS ER 20 MEQ PO TBCR
40.0000 meq | EXTENDED_RELEASE_TABLET | Freq: Two times a day (BID) | ORAL | Status: DC
  Administered 2017-05-29 – 2017-05-31 (×4): 40 meq via ORAL
  Filled 2017-05-29 (×4): qty 2

## 2017-05-29 NOTE — Progress Notes (Signed)
Physical Therapy Treatment Patient Details Name: Cheryl Velasquez MRN: [id number] DOB: 09/26/1940 Today's Date: 05/29/2017    History of Present Illness Pt is a 77 y.o. female recently d/c from [facility] after admission for dyspnea and R sided pleural effusion. She went to a rehab center, and on 05/22/17 had SOB that did not improve with lasix and O2 supplementation; admitted to ED 4/9 found to be hypoxic with R pleural effusion. PMH includes OA, R breast CA, dyspnea, HTN, liver cirrhosis with NASH, DM, hernia repair.     PT Comments    Patient received up in chair, pleasant and willing to work with skilled PT services. She requires S for all functional mobility this session, also assist with line management during mobility. Able to ambulate 71f, then another 856f attempted to monitor SpO2 on room air however signal was poor/unable to obtain accurate reading this session however she remained asymptomatic during gait period. Performed functional exercises this session for strengthening as well. Patient left up in chair with all needs met and questions addressed today.    Follow Up Recommendations  SNF;Home health PT;Supervision/Assistance - 24 hour(HHPT if 24/7 assist )     Equipment Recommendations  None recommended by PT    Recommendations for Other Services       Precautions / Restrictions Precautions Precautions: Fall Precaution Comments: watch O2 Restrictions Weight Bearing Restrictions: No    Mobility  Bed Mobility               General bed mobility comments: DNT, received up in chair   Transfers Overall transfer level: Needs assistance Equipment used: Rolling walker (2 wheeled) Transfers: Sit to/from Stand Sit to Stand: Supervision         General transfer comment: S for safety, line management   Ambulation/Gait Ambulation/Gait assistance: Supervision Ambulation Distance (Feet): 120 Feet([id number] device: Rolling walker (2 wheeled) Gait  Pattern/deviations: Step-through pattern;Decreased stride length;Trunk flexed     General Gait Details: Slow amb with RW and min guard for balance. Poor signal on SpO2 today, unable to get accurate reading from finger monitor    Stairs             Wheelchair Mobility    Modified Rankin (Stroke Patients Only)       Balance Overall balance assessment: Needs assistance Sitting-balance support: No upper extremity supported;Feet supported Sitting balance-Leahy Scale: Fair     Standing balance support: Bilateral upper extremity supported;During functional activity Standing balance-Leahy Scale: Fair Standing balance comment: Reliant on UE support                            Cognition Arousal/Alertness: Awake/alert Behavior During Therapy: WFL for tasks assessed/performed;Flat affect Overall Cognitive Status: Impaired/Different from baseline Area of Impairment: Memory;Following commands;Safety/judgement;Awareness;Problem solving                     Memory: Decreased short-term memory Following Commands: Follows multi-step commands inconsistently;Follows one step commands with increased time Safety/Judgement: Decreased awareness of safety Awareness: Emergent Problem Solving: Slow processing;Requires verbal cues        Exercises      General Comments        Pertinent Vitals/Pain Pain Assessment: No/denies pain Faces Pain Scale: No hurt    Home Living                      Prior Function  PT Goals (current goals can now be found in the care plan section) Acute Rehab PT Goals Patient Stated Goal: get back to independent PT Goal Formulation: With patient Time For Goal Achievement: 06/07/17 Potential to Achieve Goals: Good Progress towards PT goals: Progressing toward goals    Frequency    Min 3X/week      PT Plan Current plan remains appropriate    Co-evaluation              AM-PAC PT "6 Clicks" Daily  Activity  Outcome Measure  Difficulty turning over in bed (including adjusting bedclothes, sheets and blankets)?: Unable Difficulty moving from lying on back to sitting on the side of the bed? : Unable Difficulty sitting down on and standing up from a chair with arms (e.g., wheelchair, bedside commode, etc,.)?: Unable Help needed moving to and from a bed to chair (including a wheelchair)?: A Little Help needed walking in hospital room?: A Little Help needed climbing 3-5 steps with a railing? : A Little 6 Click Score: 12    End of Session   Activity Tolerance: Patient tolerated treatment well Patient left: in chair;with call bell/phone within reach   PT Visit Diagnosis: Muscle weakness (generalized) (M62.81);Difficulty in walking, not elsewhere classified (R26.2)     Time: 1111-1130 PT Time Calculation (min) (ACUTE ONLY): 19 min  Charges:  $Gait Training: 8-22 mins                    G Codes:       Deniece Ree PT, DPT, CBIS  Supplemental Physical Therapist [place]   Pager [phone]

## 2017-05-29 NOTE — Progress Notes (Addendum)
Inpatient Diabetes Program Recommendations  AACE/ADA: New Consensus Statement on Inpatient Glycemic Control (2015)  Target Ranges:  Prepandial:   less than 140 mg/dL      Peak postprandial:   less than 180 mg/dL (1-2 hours)      Critically ill patients:  140 - 180 mg/dL   Lab Results  Component Value Date   GLUCAP 156 (H) 05/29/2017   HGBA1C 9.1 (H) 04/20/2017    Review of Glycemic Control Results for Cheryl Velasquez, Cheryl Velasquez (MRN [id number]) as of 05/29/2017 09:25  Ref. Range 05/28/2017 11:27 05/28/2017 16:08 05/28/2017 21:58 05/29/2017 06:12  Glucose-Capillary Latest Ref Range: 65 - 99 mg/dL 287 (H) 285 (H) 218 (H) 156 (H)   Diabetes history: Type 2 DM Outpatient Diabetes medications: Novolog 5 units TID, Lantus 20 units QHS Current orders for Inpatient glycemic control: Novolog 0-15 units TID, Novolog 0-5 units QHS   Inpatient Diabetes Program Recommendations:    If post prandials continue to remain >180 mg/dL, consider adding Novolog 4 units TID (assuming patient consumes >50%).  Thanks, Bronson Curb, MSN, RNC-OB Diabetes Coordinator [phone] (8a-5p)

## 2017-05-29 NOTE — Progress Notes (Signed)
[facility] TEAM 1 - Stepdown/ICU TEAM  MALI Velasquez  WUX:[id number] DOB: 08-06-1940 DOA: 05/23/2017 PCP: Galen Manila, MD    Brief Narrative:  77 year old female w/ a hx of DM2, COPD, CHF, NASH cirrhosis, hepatocellular carcinoma s/p ablation Jan 2018 and March 2019, and a recent hospitalization for a R transudative pleural effusion. She was discharge to a SNF where she developed severe dyspnea, refractory to diuretics.  In the ED a CXR confirmed a large right pleural effusion.  Significant Events: 4/9 admit 4/9 R thoracentesis - 1.8L clear yellow fluid   Subjective: Clinically appears stable.  Had a brief episode of dyspnea this morning which resolved spontaneously.  Denies chest pain nausea vomiting or abdominal pain.  No dizziness or lightheadedness.  Her blood pressure is trending downward and we may be approaching the point at which we have to slow her diuresis.  Assessment & Plan:  Recurrent R pleural effusion / Hepatic hydrothorax  improved after thoracentesis - added aldactone - continue furosemide - if rapidly re-accumulates will have to consider TIPS - unfortunately pleural fluid was NOT sent for lab studies to confirm a recurrent transudate - f/u CXR 4/12 suggested fluid had reaccumulated to a moderate extent already - increased diuresis markedly on 4/12 - tolerating thus far - f/u CXR 4/15 notes stability of effusion since 4/12, suggesting that aggressive diuresis may be adequate to keep the effusion stable - BP may soon become a diuresis limiting issue   Chronic liver failure / NASH Cirrhosis / Hepatocellular CA No encephalopathy presently - Childs/Pugh 7-8 = Class B / MELD is 10  Acute on chronic diastolic LV Heart failure continue diuresis w/o change for now - net negative ~17L thus far, and ~3.8L over the last 24hrs - follow BP trend  Filed Weights   05/27/17 0526 05/28/17 0510 05/29/17 0500  Weight: 105.1 kg (231 lb 11.3 oz) 101.7 kg (224 lb 3.3 oz) 102 kg (224 lb  13.9 oz)    Chronic Atrial fibrillation not on anticoagulation due to risk of esophageal varices bleed - rate controlled   Dyslipidemia Discontinued statin therapy in setting of signif liver disease and shortened life expectancy   DM2 - controlled w/o complication CBG remains quite variable - follow w/o change today    DVT prophylaxis: SCDs Code Status: FULL CODE Family Communication: no family present at time of exam today  Disposition Plan: hopeful for d/c Wed if pleural effusion remains stable, and BP holds despite ongoing diuretic use  Consultants:  PCCM  Antimicrobials:  none  Objective: Blood pressure 128/72, pulse 73, temperature 98 F (36.7 C), temperature source Oral, resp. rate 20, height 5' 2"  (1.575 m), weight 102 kg (224 lb 13.9 oz), SpO2 94 %.  Intake/Output Summary (Last 24 hours) at 05/29/2017 1408 Last data filed at 05/29/2017 1300 Gross per 24 hour  Intake 490 ml  Output 3451 ml  Net -2961 ml   Filed Weights   05/27/17 0526 05/28/17 0510 05/29/17 0500  Weight: 105.1 kg (231 lb 11.3 oz) 101.7 kg (224 lb 3.3 oz) 102 kg (224 lb 13.9 oz)    Examination: General: No acute respiratory distress at rest  Lungs: poor air movement in R base - CTA otherwise   Cardiovascular: RRR w/o M or rub  Abdomen: Nontender, protuberant, soft, no rebound, suspected ascites  Extremities: 1+ B LE edema  CBC: Recent Labs  Lab 05/24/17 0316 05/26/17 1336 05/27/17 0250 05/29/17 0348  WBC 2.3* 4.2 3.1* 3.3*  NEUTROABS 1.2*  --   --   --  HGB 11.5* 14.1 12.2 12.6  HCT 33.6* 41.0 35.9* 38.2  MCV 86.8 89.9 88.4 90.5  PLT 66* 107* 50* 51*   Basic Metabolic Panel: Recent Labs  Lab 05/23/17 0717  05/27/17 0250 05/28/17 0418 05/29/17 0348  NA 126*   < > 132* 132* 134*  K 4.0   < > 3.2* 3.7 4.2  CL 92*   < > 90* 91* 93*  CO2 25   < > 32 31 30  GLUCOSE 128*   < > 173* 131* 154*  BUN 15   < > 11 13 11   CREATININE 0.76   < > 0.76 0.81 0.82  CALCIUM 8.4*   < > 7.7*  7.7* 8.0*  MG 1.6*   < > 1.7 1.6* 1.8  PHOS 4.2  --   --   --   --    < > = values in this interval not displayed.   GFR: Estimated Creatinine Clearance: 62.3 mL/min (by C-G formula based on SCr of 0.82 mg/dL).  Liver Function Tests: Recent Labs  Lab 05/23/17 0717 05/26/17 0413 05/27/17 0250  AST 47* 40 44*  ALT 30 31 32  ALKPHOS 65 66 69  BILITOT 1.3* 1.4* 1.2  PROT 5.7* 5.1* 5.4*  ALBUMIN 2.6* 2.3* 2.4*    HbA1C: Hgb A1c MFr Bld  Date/Time Value Ref Range Status  04/20/2017 12:09 PM 9.1 (H) 4.8 - 5.6 % Final    Comment:    (NOTE) Pre diabetes:          5.7%-6.4% Diabetes:              >6.4% Glycemic control for   <7.0% adults with diabetes   03/10/2016 11:00 AM 6.3 (H) 4.8 - 5.6 % Final    Comment:    (NOTE)         Pre-diabetes: 5.7 - 6.4         Diabetes: >6.4         Glycemic control for adults with diabetes: <7.0     CBG: Recent Labs  Lab 05/28/17 1127 05/28/17 1608 05/28/17 2158 05/29/17 0612 05/29/17 1144  GLUCAP 287* 285* 218* 156* 184*     Scheduled Meds: . calcium-vitamin D  1 tablet Oral Daily  . escitalopram  10 mg Oral Daily  . famotidine  20 mg Oral Daily  . furosemide  80 mg Intravenous Q8H  . insulin aspart  0-15 Units Subcutaneous TID WC  . insulin aspart  0-5 Units Subcutaneous QHS  . mouth rinse  15 mL Mouth Rinse BID  . multivitamin with minerals  1 tablet Oral Daily  . potassium chloride  40 mEq Oral TID  . propranolol  10 mg Oral BID  . QUEtiapine  25 mg Oral QHS  . simvastatin  40 mg Oral QHS  . spironolactone  100 mg Oral Daily     LOS: 6 days   [name], MD [facility] Office  [phone] Pager - Text Page per Amion as per below:  On-Call/Text Page:      [email or web address]      password [email or web address]  If 7PM-7AM, please contact night-coverage [email or web address] Password [facility] 05/29/2017, 2:08 PM

## 2017-05-29 NOTE — Progress Notes (Signed)
Met patient and had short visit as she was resting.  Had prayer with her regarding her healing and her therapy coming up. Conard Novak, Chaplain   05/29/17 1400  Clinical Encounter Type  Visited With Patient  Visit Type Initial;Spiritual support  Referral From Nurse  Consult/Referral To Chaplain  Spiritual Encounters  Spiritual Needs Prayer;Emotional  Stress Factors  Patient Stress Factors None identified  Family Stress Factors None identified

## 2017-05-30 ENCOUNTER — Inpatient Hospital Stay (HOSPITAL_COMMUNITY): Payer: Medicare Other

## 2017-05-30 DIAGNOSIS — R0603 Acute respiratory distress: Secondary | ICD-10-CM

## 2017-05-30 LAB — BASIC METABOLIC PANEL
Anion gap: 10 (ref 5–15)
BUN: 15 mg/dL (ref 6–20)
CHLORIDE: 94 mmol/L — AB (ref 101–111)
CO2: 31 mmol/L (ref 22–32)
Calcium: 8.6 mg/dL — ABNORMAL LOW (ref 8.9–10.3)
Creatinine, Ser: 0.97 mg/dL (ref 0.44–1.00)
GFR, EST NON AFRICAN AMERICAN: 54 mL/min — AB (ref >60)
Glucose, Bld: 153 mg/dL — ABNORMAL HIGH (ref 65–99)
Potassium: 4.3 mmol/L (ref 3.5–5.1)
SODIUM: 135 mmol/L (ref 135–145)

## 2017-05-30 LAB — GLUCOSE, CAPILLARY
GLUCOSE-CAPILLARY: 163 mg/dL — AB (ref 65–99)
GLUCOSE-CAPILLARY: 179 mg/dL — AB (ref 65–99)
GLUCOSE-CAPILLARY: 238 mg/dL — AB (ref 65–99)
GLUCOSE-CAPILLARY: 229 mg/dL — AB (ref 65–99)

## 2017-05-30 LAB — CBC
HEMATOCRIT: 39.2 % (ref 36.0–46.0)
Hemoglobin: 13.2 g/dL (ref 12.0–15.0)
MCH: 30.1 pg (ref 26.0–34.0)
MCHC: 33.7 g/dL (ref 30.0–36.0)
MCV: 89.5 fL (ref 78.0–100.0)
Platelets: 52 10*3/uL — ABNORMAL LOW (ref 150–400)
RBC: 4.38 MIL/uL (ref 3.87–5.11)
RDW: 14.9 % (ref 11.5–15.5)
WBC: 3.2 10*3/uL — AB (ref 4.0–10.5)

## 2017-05-30 LAB — MAGNESIUM: Magnesium: 1.9 mg/dL (ref 1.7–2.4)

## 2017-05-30 MED ORDER — INSULIN ASPART 100 UNIT/ML ~~LOC~~ SOLN: 0.0000 [IU] | Freq: Three times a day (TID) | SUBCUTANEOUS | 11 refills | Status: DC

## 2017-05-30 MED ORDER — CALCIUM CARBONATE-VITAMIN D 500-200 MG-UNIT PO TABS: 1.0000 | ORAL_TABLET | Freq: Every day | ORAL | Status: DC

## 2017-05-30 MED ORDER — POTASSIUM CHLORIDE CRYS ER 20 MEQ PO TBCR: 40.0000 meq | EXTENDED_RELEASE_TABLET | Freq: Two times a day (BID) | ORAL | Status: DC

## 2017-05-30 MED ORDER — INSULIN ASPART 100 UNIT/ML ~~LOC~~ SOLN: 0.0000 [IU] | Freq: Every day | SUBCUTANEOUS | 11 refills | Status: DC

## 2017-05-30 MED ORDER — PROPRANOLOL HCL 10 MG PO TABS: 5.0000 mg | ORAL_TABLET | Freq: Two times a day (BID) | ORAL | Status: DC

## 2017-05-30 MED ORDER — ADULT MULTIVITAMIN W/MINERALS CH: 1.0000 | ORAL_TABLET | Freq: Every day | ORAL | Status: DC

## 2017-05-30 MED ORDER — QUETIAPINE FUMARATE 25 MG PO TABS: 25.0000 mg | ORAL_TABLET | Freq: Every day | ORAL | Status: DC

## 2017-05-30 MED ORDER — SPIRONOLACTONE 100 MG PO TABS: 100.0000 mg | ORAL_TABLET | Freq: Every day | ORAL | Status: DC

## 2017-05-30 MED ORDER — FUROSEMIDE 80 MG PO TABS
80.0000 mg | ORAL_TABLET | Freq: Three times a day (TID) | ORAL | Status: DC
  Administered 2017-05-30 – 2017-05-31 (×3): 80 mg via ORAL
  Filled 2017-05-30 (×3): qty 1

## 2017-05-30 MED ORDER — FUROSEMIDE 80 MG PO TABS: 80.0000 mg | ORAL_TABLET | Freq: Three times a day (TID) | ORAL | Status: DC

## 2017-05-30 MED ORDER — FUROSEMIDE 10 MG/ML IJ SOLN
80.0000 mg | Freq: Once | INTRAMUSCULAR | Status: AC
  Administered 2017-05-30: 80 mg via INTRAVENOUS
  Filled 2017-05-30: qty 8

## 2017-05-30 NOTE — Progress Notes (Signed)
Physical Therapy Treatment Patient Details Name: HAZLEIGH MCCLEAVE MRN: [id number] DOB: December 02, 1940 Today's Date: 05/30/2017    History of Present Illness Pt is a 77 y.o. female recently d/c from [facility] after admission for dyspnea and R sided pleural effusion. She went to a rehab center, and on 05/22/17 had SOB that did not improve with lasix and O2 supplementation; admitted to ED 4/9 found to be hypoxic with R pleural effusion. PMH includes OA, R breast CA, dyspnea, HTN, liver cirrhosis with NASH, DM, hernia repair.     PT Comments    Patient very pleasant and motivated to work with therapy. Overall, progressing very well towards their physical therapy goals. Able to ambulate 150 feet with RW and supervision. However, gait speed of 0.98 ft/s representing that patient is a limited household ambulator and is at risk for falls. Session also focused on sit to stands to increase BLE functional strength and power. Will benefit from continued skilled PT to increase endurance, gait speed, and functional strength.     Follow Up Recommendations  SNF;Home health PT;Supervision/Assistance - 24 hour(24 hour supervision initially)     Equipment Recommendations  None recommended by PT    Recommendations for Other Services       Precautions / Restrictions Precautions Precautions: Fall Precaution Comments: watch O2 Restrictions Weight Bearing Restrictions: No(Simultaneous filing. User may not have seen previous data.)    Mobility  Bed Mobility               General bed mobility comments: OOB in recliner  Transfers Overall transfer level: Needs assistance Equipment used: Rolling walker (2 wheeled) Transfers: Sit to/from Stand Sit to Stand: Supervision         General transfer comment: Supervision for safety  Ambulation/Gait Ambulation/Gait assistance: Min guard Ambulation Distance (Feet): 150 Feet Assistive device: Rolling walker (2 wheeled) Gait Pattern/deviations: Step-through  pattern;Decreased stride length;Trunk flexed Gait velocity: 0.98 ft/s Gait velocity interpretation: <1.31 ft/sec, indicative of household ambulator General Gait Details: Min guard for balance. VC's for RW proximity.    Stairs             Wheelchair Mobility    Modified Rankin (Stroke Patients Only)       Balance Overall balance assessment: Needs assistance Sitting-balance support: No upper extremity supported;Feet supported Sitting balance-Leahy Scale: Fair     Standing balance support: During functional activity;No upper extremity supported Standing balance-Leahy Scale: Fair                              Cognition Arousal/Alertness: Awake/alert Behavior During Therapy: WFL for tasks assessed/performed;Flat affect Overall Cognitive Status: Impaired/Different from baseline Area of Impairment: Memory;Following commands;Safety/judgement;Awareness;Problem solving                     Memory: Decreased short-term memory Following Commands: Follows multi-step commands inconsistently;Follows one step commands with increased time Safety/Judgement: Decreased awareness of safety Awareness: Emergent Problem Solving: Slow processing;Requires verbal cues        Exercises Other Exercises Other Exercises: 3 sit to stands progressing from using bilateral upper extremities for push off to single upper extremity    General Comments        Pertinent Vitals/Pain Pain Assessment: No/denies pain Faces Pain Scale: No hurt    Home Living                      Prior Function  PT Goals (current goals can now be found in the care plan section) Acute Rehab PT Goals Patient Stated Goal: get back to independent PT Goal Formulation: With patient Time For Goal Achievement: 06/07/17 Potential to Achieve Goals: Good Progress towards PT goals: Progressing toward goals    Frequency    Min 3X/week      PT Plan Current plan remains  appropriate    Co-evaluation              AM-PAC PT "6 Clicks" Daily Activity  Outcome Measure  Difficulty turning over in bed (including adjusting bedclothes, sheets and blankets)?: Unable Difficulty moving from lying on back to sitting on the side of the bed? : Unable Difficulty sitting down on and standing up from a chair with arms (e.g., wheelchair, bedside commode, etc,.)?: Unable Help needed moving to and from a bed to chair (including a wheelchair)?: A Little Help needed walking in hospital room?: A Little Help needed climbing 3-5 steps with a railing? : A Little 6 Click Score: 12    End of Session Equipment Utilized During Treatment: Gait belt Activity Tolerance: Patient tolerated treatment well Patient left: in chair;with call bell/phone within reach Nurse Communication: Mobility status PT Visit Diagnosis: Muscle weakness (generalized) (M62.81);Difficulty in walking, not elsewhere classified (R26.2)     Time: 4037-5436 PT Time Calculation (min) (ACUTE ONLY): 11 min  Charges:  $Gait Training: 8-22 mins                    G Codes:       Ellamae Sia, PT, DPT Acute Rehabilitation Services  Pager: [phone]    Willy Eddy 05/30/2017, 3:04 PM

## 2017-05-30 NOTE — Care Management Important Message (Signed)
Important Message  Patient Details  Name: Cheryl Velasquez MRN: [id number] Date of Birth: 1940/07/21   Medicare Important Message Given:  Yes    Eesha Schmaltz P Anikah Hogge 05/30/2017, 2:12 PM

## 2017-05-30 NOTE — Discharge Summary (Signed)
DISCHARGE SUMMARY  JONE PANEBIANCO  MR#: [id number]  DOB:12/20/1940  Date of Admission: 05/23/2017 Date of Discharge: 05/31/2017  Attending Physician:Meilah Delrosario T Thereasa Solo  Patient's XJO:ITGPQ, Jenne Pane, MD  Consults:  PCCM  Disposition: D/C to SNF   Follow-up Appts: Follow-up Information    Galen Manila, MD. Schedule an appointment as soon as possible for a visit.   Specialty:  Internal Medicine Why:  You should be seen within a week of beign released from your rehab facility.   Contact information: [place] [address] [phone]         -pt should have an outpt GI / Hepatology follow-up arranged after she is d/c from her rehab facility   Tests Needing Follow-up: -close monitoring of R pleural effusion size  -close titration of diuretic dosing - daily weights should be followed closely as a measure of her volume status  -close monitoring of renal fxn and K+/Mg+ balance  -watch to assure oral intake of fluids does not rapidly increase in setting of high dose diuretic  -follow CBG   Discharge Diagnoses: Recurrent R pleural effusion / Hepatic hydrothorax  Chronic liver failure / NASH Cirrhosis Hepatocellular CA Acute on chronic diastolic LV Heart failure Chronic Atrial fibrillation Dyslipidemia DM2 - controlled w/o complication  Initial presentation: 77 year old female w/ a hx of DM2, COPD, CHF, NASH cirrhosis, hepatocellular carcinoma s/p ablation Jan 2018 and March 2019, and a recent hospitalization for a R transudative pleural effusion.She was discharge to a SNF where she developed severe dyspnea, refractory to diuretics. In the ED a CXR confirmed a large right pleural effusion.  Hospital Course:  Recurrent R pleural effusion / Hepatic hydrothorax  improved after thoracentesis - added aldactone - continue furosemide - if rapidly re-accumulates will have to consider TIPS- f/u CXR 4/12 suggested fluid had reaccumulated to a moderate extent  rapidly - increased diuresis markedly on 4/12 - tolerating at time of d/c - f/u CXR 4/15 noted stability of effusion since 4/12, suggesting that aggressive diuresis may be adequate to keep the effusion stable - f/u CXR again on 4/16 confirmed stable size of effusion - BP tolerating w/o difficulty - renal fxn holding steady   Chronic liver failure / NASH Cirrhosis / Hepatocellular CA No encephalopathy encountered during this hospital stay - Childs/Pugh 7-8 = Class B / MELD is 10  Acute on chronic diastolic LV Heart failure continue diuresis w/o change for now - net negative ~20L as of 4/16 - follow BP trend and renal fxn w/ ongoing use of high dose diuretic  Filed Weights   05/28/17 0510 05/29/17 0500 05/30/17 0451  Weight: 101.7 kg (224 lb 3.3 oz) 102 kg (224 lb 13.9 oz) 103.7 kg (228 lb 9.9 oz)    Chronic Atrial fibrillation not on anticoagulation due to risk of esophageal varices bleed - rate controlled th/o hospital stay   Dyslipidemia Discontinued statin therapy in setting of signif liver disease and shortened life expectancy   DM2 - controlled w/o complication CBG variable th/o hospital stay, but consistently <180 on SSI only   Allergies as of 05/30/2017   No Known Allergies     Medication List    STOP taking these medications   aspirin 81 MG EC tablet   CALTRATE 600+D 600-400 MG-UNIT tablet Generic drug:  Calcium Carbonate-Vitamin D Replaced by:  calcium-vitamin D 500-200 MG-UNIT tablet   escitalopram 10 MG tablet Commonly known as:  LEXAPRO   hydroxypropyl methylcellulose / hypromellose 2.5 % ophthalmic solution Commonly known  as:  ISOPTO TEARS / GONIOVISC   insulin glargine 100 UNIT/ML injection Commonly known as:  LANTUS   OXYGEN   simvastatin 40 MG tablet Commonly known as:  ZOCOR   triamcinolone cream 0.1 % Commonly known as:  KENALOG     TAKE these medications   calcium-vitamin D 500-200 MG-UNIT tablet Commonly known as:  OSCAL WITH D Take 1  tablet by mouth daily. Start taking on:  05/31/2017 Replaces:  CALTRATE 600+D 600-400 MG-UNIT tablet   furosemide 80 MG tablet Commonly known as:  LASIX Take 1 tablet (80 mg total) by mouth 3 (three) times daily. What changed:    medication strength  how much to take  when to take this   insulin aspart 100 UNIT/ML injection Commonly known as:  novoLOG Inject 0-5 Units into the skin at bedtime. What changed:    how much to take  when to take this   insulin aspart 100 UNIT/ML injection Commonly known as:  novoLOG Inject 0-15 Units into the skin 3 (three) times daily with meals. What changed:  You were already taking a medication with the same name, and this prescription was added. Make sure you understand how and when to take each.   multivitamin with minerals Tabs tablet Take 1 tablet by mouth daily. Start taking on:  05/31/2017 What changed:  additional instructions   pantoprazole 40 MG tablet Commonly known as:  PROTONIX TAKE 1 TABLET BY MOUTH EVERY DAY What changed:    how much to take  how to take this  when to take this   potassium chloride SA 20 MEQ tablet Commonly known as:  K-DUR,KLOR-CON Take 2 tablets (40 mEq total) by mouth 2 (two) times daily.   propranolol 10 MG tablet Commonly known as:  INDERAL Take 0.5 tablets (5 mg total) by mouth 2 (two) times daily. What changed:  how much to take   QUEtiapine 25 MG tablet Commonly known as:  SEROQUEL Take 1 tablet (25 mg total) by mouth at bedtime. What changed:    medication strength  how much to take   spironolactone 100 MG tablet Commonly known as:  ALDACTONE Take 1 tablet (100 mg total) by mouth daily. Start taking on:  05/31/2017       Day of Discharge BP (!) 105/52 (BP Location: Left Arm)   Pulse 69   Temp 97.8 F (36.6 C) (Oral)   Resp 15   Ht 5' 2"  (1.575 m)   Wt 103.7 kg (228 lb 9.9 oz)   SpO2 97%   BMI 41.81 kg/m   Physical Exam: General: No acute respiratory distress at  rest in bedside chair  Lungs: stable poor air movement in R base - CTA otherwise w/o wheezing   Cardiovascular: regular rate w/o M or rub  Abdomen: Nontender, protuberant, soft, no rebound, suspected ascites  Extremities: 1+ B LE edema w/o erythema    Basic Metabolic Panel: Recent Labs  Lab 05/26/17 0413 05/27/17 0250 05/28/17 0418 05/29/17 0348 05/30/17 0502  NA 133* 132* 132* 134* 135  K 3.4* 3.2* 3.7 4.2 4.3  CL 91* 90* 91* 93* 94*  CO2 34* 32 31 30 31   GLUCOSE 172* 173* 131* 154* 153*  BUN 13 11 13 11 15   CREATININE 0.90 0.76 0.81 0.82 0.97  CALCIUM 7.9* 7.7* 7.7* 8.0* 8.6*  MG 1.5* 1.7 1.6* 1.8 1.9    Liver Function Tests: Recent Labs  Lab 05/26/17 0413 05/27/17 0250  AST 40 44*  ALT 31 32  ALKPHOS 66 69  BILITOT 1.4* 1.2  PROT 5.1* 5.4*  ALBUMIN 2.3* 2.4*    Coags: Recent Labs  Lab 05/27/17 0250  INR 1.32    CBC: Recent Labs  Lab 05/24/17 0316 05/26/17 1336 05/27/17 0250 05/29/17 0348 05/30/17 0502  WBC 2.3* 4.2 3.1* 3.3* 3.2*  NEUTROABS 1.2*  --   --   --   --   HGB 11.5* 14.1 12.2 12.6 13.2  HCT 33.6* 41.0 35.9* 38.2 39.2  MCV 86.8 89.9 88.4 90.5 89.5  PLT 66* 107* 50* 51* 52*   BNP (last 3 results) Recent Labs    05/03/17 2358  BNP 210.9*    CBG: Recent Labs  Lab 05/29/17 1144 05/29/17 1630 05/29/17 2055 05/30/17 0617 05/30/17 1113  GLUCAP 184* 173* 276* 163* 179*    Time spent in discharge (includes decision making & examination of pt): 35 minutes  05/30/2017, 2:37 PM   [name], MD [facility] Office  [phone] Pager [phone]  On-Call/Text Page:      [email or web address]      password [facility]

## 2017-05-30 NOTE — Consult Note (Signed)
   [facility] CM Inpatient Consult   05/30/2017  Cheryl Velasquez [birth year] [id number]  Patient screened for potential [place] [facility] services. Patient is in the [place] of the [facility] services under patient's Medicare plan.  Patient was recently at [facility] facility now going to [facility].  Patient is from [place] and her primary care provider is in [place]. For questions contact:   Natividad Brood, RN BSN [facility] Liaison  [phone] business mobile phone Toll free office [phone]

## 2017-05-30 NOTE — Progress Notes (Signed)
Clinical Social Worker following patient for support and discharge need. Patient has a bed at a rehab facility once medically cleared.   Rhea Pink, MSW,  [place]

## 2017-05-30 NOTE — NC FL2 (Signed)
[place] MEDICAID FL2 LEVEL OF CARE SCREENING TOOL     IDENTIFICATION  Patient Name: Cheryl Velasquez Birthdate: 12/20/1940 Sex: female Admission Date (Current Location): 05/23/2017  [facility] and [place] Number:  Herbalist and Address:  [facility] [place], [place] [address] [place], [place] [address]      Provider Number: [id number]  Attending Physician Name and Address:  [name], MD  Relative Name and Phone Number:       Current Level of Care: Hospital Recommended Level of Care: [place] Prior Approval Number:    Date Approved/Denied:   PASRR Number: [id number] A exp 06/20/17  Discharge Plan: SNF    Current Diagnoses: Patient Active Problem List   Diagnosis Date Noted  . Pleural effusion 05/23/2017  . Hypoxemia requiring supplemental oxygen   . Respiratory failure ([place]) 05/03/2017  . Hepatocellular carcinoma ([place]) 03/11/2016  . Esophageal varices in cirrhosis ([place]) 12/22/2015  . Other cirrhosis of liver ([place]) 12/22/2015  . Iron deficiency anemia due to chronic blood loss 10/04/2015  . Breast cancer of upper-outer quadrant of right female breast ([place]) 10/02/2015  . Liver mass   . Congestive splenomegaly 08/04/2014  . Esophageal varices ([place]) 11/20/2012  . Obesity 11/20/2012  . Leukopenia 09/14/2012  . Thrombocytopenia ([place]) 09/14/2012  . B12 deficiency 09/14/2012  . Cirrhosis, non-alcoholic ([place]) 57/84/6962  . Multinodular goiter 01/18/2011  . Arthritis   . Diabetes mellitus   . Hyperlipidemia   . Hypertension   . Hyperthyroidism 01/11/2011    Orientation RESPIRATION BLADDER Height & Weight     Time, Self, Place  Normal Continent, External catheter Weight: 228 lb 9.9 oz (103.7 kg) Height:  5' 2"  (157.5 cm)  BEHAVIORAL SYMPTOMS/MOOD NEUROLOGICAL BOWEL NUTRITION STATUS      Continent Diet(heart healthy)  AMBULATORY STATUS COMMUNICATION OF NEEDS Skin   Limited Assist Verbally                          Personal Care Assistance Level of Assistance    Bathing Assistance: Limited assistance Feeding assistance: Independent Dressing Assistance: Limited assistance     Functional Limitations Info  Hearing, Speech, Sight Sight Info: Adequate Hearing Info: Adequate Speech Info: Adequate    SPECIAL CARE FACTORS FREQUENCY  PT (By licensed PT), OT (By licensed OT)     PT Frequency: 5x wk OT Frequency: 5x wk            Contractures Contractures Info: Not present    Additional Factors Info  Code Status, Allergies Code Status Info: full code Allergies Info: No known Allergies           Current Medications (05/30/2017):  This is the current hospital active medication list Current Facility-Administered Medications  Medication Dose Route Frequency Provider Last Rate Last Dose  . calcium-vitamin D (OSCAL WITH D) 500-200 MG-UNIT per tablet 1 tablet  1 tablet Oral Daily [name], MD   1 tablet at 05/29/17 [phone]  . escitalopram (LEXAPRO) tablet 10 mg  10 mg Oral Daily [name], MD   10 mg at 05/29/17 0806  . famotidine (PEPCID) tablet 20 mg  20 mg Oral Daily Arrien, Jimmy Picket, MD   20 mg at 05/29/17 0806  . furosemide (LASIX) injection 80 mg  80 mg Intravenous Q8H [name], MD   80 mg at 05/30/17 4132  . hydroxypropyl methylcellulose / hypromellose (ISOPTO TEARS / GONIOVISC) 2.5 % ophthalmic solution 1 drop  1 drop Both Eyes TID PRN Joette Catching T, MD      . insulin aspart (novoLOG) injection 0-15 Units  0-15 Units Subcutaneous TID WC [name], MD   3 Units at 05/30/17 [phone]  . insulin aspart (novoLOG) injection 0-5 Units  0-5 Units Subcutaneous QHS [name], MD   3 Units at 05/29/17 2133  . MEDLINE mouth rinse  15 mL Mouth Rinse BID Tarry Kos, MD   15 mL at 05/29/17 0805  . multivitamin with minerals tablet 1 tablet  1 tablet Oral Daily [name], MD   1 tablet at 05/29/17 0805  . potassium chloride SA  (K-DUR,KLOR-CON) CR tablet 40 mEq  40 mEq Oral BID [name], MD   40 mEq at 05/29/17 2128  . propranolol (INDERAL) tablet 5 mg  5 mg Oral BID [name], MD   5 mg at 05/29/17 2129  . QUEtiapine (SEROQUEL) tablet 25 mg  25 mg Oral QHS [name], MD   25 mg at 05/29/17 2129  . simvastatin (ZOCOR) tablet 40 mg  40 mg Oral QHS Desai, Rahul P, PA-C   40 mg at 05/29/17 2129  . spironolactone (ALDACTONE) tablet 100 mg  100 mg Oral Daily [name], MD   100 mg at 05/29/17 0233     Discharge Medications: Please see discharge summary for a list of discharge medications.  Relevant Imaging Results:  Relevant Lab Results:   Additional Information SS# [place], [place]

## 2017-05-30 NOTE — Discharge Instructions (Signed)
Pleural Effusion A pleural effusion is an abnormal buildup of fluid in the layers of tissue between your lungs and the inside of your chest (pleural space). These two layers of tissue that line both your lungs and the inside of your chest are called pleura. Usually, there is no air in the space between the pleura, only a thin layer of fluid. If left untreated, a large amount of fluid can build up and cause the lung to collapse. A pleural effusion is usually caused by another disease that requires treatment. The two main types of pleural effusion are:  Transudative pleural effusion. This happens when fluid leaks into the pleural space because of a low protein count in your blood or high blood pressure in your vessels. Heart failure often causes this.  Exudative infusion. This occurs when fluid collects in the pleural space from blocked blood vessels or lymph vessels. Some lung diseases, injuries, and cancers can cause this type of effusion.  What are the causes? Pleural effusion can be caused by:  Heart failure.  A blood clot in the lung (pulmonary embolism).  Pneumonia.  Cancer.  Liver failure (cirrhosis).  Kidney disease.  Complications from surgery, such as from open heart surgery.  What are the signs or symptoms? In some cases, pleural effusion may cause no symptoms. Symptoms can include:  Shortness of breath, especially when lying down.  Chest pain, often worse when taking a deep breath.  Fever.  Dry cough that is lasting (chronic).  Hiccups.  Rapid breathing.  An underlying condition that is causing the pleural effusion (such as heart failure, pneumonia, blood clots, tuberculosis, or cancer) may also cause additional symptoms. How is this diagnosed? Your health care provider may suspect pleural effusion based on your symptoms and medical history. Your health care provider will also do a physical exam and a chest X-ray. If the X-ray shows there is fluid in your chest,  you may need to have this fluid removed using a needle (thoracentesis) so it can be tested. You may also have:  Imaging studies of the chest, such as: ? Ultrasound. ? CT scan.  Blood tests for kidney and liver function.  How is this treated? Treatment depends on the cause of the pleural effusion. Treatment may include:  Taking antibiotic medicines to clear up an infection that is causing the pleural effusion.  Placing a tube in the chest to drain the effusion (tube thoracostomy). This procedure is often used when there is an infection in the fluid.  Surgery to remove the fibrous outer layer of tissue from the pleural space (decortication).  Thoracentesis, which can improve cough and shortness of breath.  A procedure to put medicine into the chest cavity to seal the pleural space to prevent fluid buildup (pleurodesis).  Chemotherapy and radiation therapy. These may be required in the case of cancerous (malignant) pleural effusion.  Follow these instructions at home:  Take medicines only as directed by your health care provider.  Keep track of how long you can gently exercise before you get short of breath. Try simply walking at first.  Do not use any tobacco products, including cigarettes, chewing tobacco, or electronic cigarettes. If you need help quitting, ask your health care provider.  Keep all follow-up visits as directed by your health care provider. This is important. Contact a health care provider if:  The amount of time that you are able to exercise decreases or does not improve with time.  You have pain or signs of infection  at the puncture site if you had thoracentesis. Watch for: ? Drainage. ? Redness. ? Swelling.  You have a fever. Get help right away if:  You are short of breath.  You develop chest pain.  You develop a new cough. This information is not intended to replace advice given to you by your health care provider. Make sure you discuss any  questions you have with your health care provider. Document Released: 01/31/2005 Document Revised: 07/06/2015 Document Reviewed: 06/26/2013 Elsevier Interactive Patient Education  2018 [employer].   Cirrhosis Cirrhosis is long-term (chronic) liver injury. The liver is your largest internal organ, and it performs many functions. The liver converts food into energy, removes toxic material from your blood, makes important proteins, and absorbs necessary vitamins from your diet. If you have cirrhosis, it means many of your healthy liver cells have been replaced by scar tissue. This prevents blood from flowing through your liver, which makes it difficult for your liver to function. This scarring is not reversible, but treatment can prevent it from getting worse. What are the causes? Hepatitis C and long-term alcohol abuse are the most common causes of cirrhosis. Other causes include:  Nonalcoholic fatty liver disease.  Hepatitis B infection.  Autoimmune hepatitis.  Diseases that cause blockage of ducts inside the liver.  Inherited liver diseases.  Reactions to certain long-term medicines.  Parasitic infections.  Long-term exposure to certain toxins.  What increases the risk? You may have a higher risk of cirrhosis if you:  Have certain hepatitis viruses.  Abuse alcohol, especially if you are female.  Are overweight.  Share needles.  Have unprotected sex with someone who has hepatitis.  What are the signs or symptoms? You may not have any signs and symptoms at first. Symptoms may not develop until the damage to your liver starts to get worse. Signs and symptoms of cirrhosis may include:  Tenderness in the right-upper part of your abdomen.  Weakness and tiredness (fatigue).  Loss of appetite.  Nausea.  Weight loss and muscle loss.  Itchiness.  Yellow skin and eyes (jaundice).  Buildup of fluid in the abdomen (ascites).  Swelling of the feet and ankles  (edema).  Appearance of tiny blood vessels under the skin.  Mental confusion.  Easy bruising and bleeding.  How is this diagnosed? Your health care provider may suspect cirrhosis based on your symptoms and medical history, especially if you have other medical conditions or a history of alcohol abuse. Your health care provider will do a physical exam to feel your liver and check for signs of cirrhosis. Your health care provider may perform other tests, including:  Blood tests to check: ? Whether you have hepatitis B or C. ? Kidney function. ? Liver function.  Imaging tests such as: ? MRI or CT scan to look for changes seen in advanced cirrhosis. ? Ultrasound to see if normal liver tissue is being replaced by scar tissue.  A procedure using a long needle to take a sample of liver tissue (biopsy) for examination under a microscope. Liver biopsy can confirm the diagnosis of cirrhosis.  How is this treated? Treatment depends on how damaged your liver is and what caused the damage. Treatment may include treating cirrhosis symptoms or treating the underlying causes of the condition to try to slow the progression of the damage. Treatment may include:  Making lifestyle changes, such as: ? Eating a healthy diet. ? Restricting salt intake. ? Maintaining a healthy weight. ? Not abusing drugs or alcohol.  Taking medicines to: ? Treat liver infections or other infections. ? Control itching. ? Reduce fluid buildup. ? Reduce certain blood toxins. ? Reduce risk of bleeding from enlarged blood vessels in the stomach or esophagus (varices).  If varices are causing bleeding problems, you may need treatment with a procedure that ties up the vessels causing them to fall off (band ligation).  If cirrhosis is causing your liver to fail, your health care provider may recommend a liver transplant.  Other treatments may be recommended depending on any complications of cirrhosis, such as liver-related  kidney failure (hepatorenal syndrome).  Follow these instructions at home:  Take medicines only as directed by your health care provider. Do not use drugs that are toxic to your liver. Ask your health care provider before taking any new medicines, including over-the-counter medicines.  Rest as needed.  Eat a well-balanced diet. Ask your health care provider or dietitian for more information.  You may have to follow a low-salt diet or restrict your water intake as directed.  Do not drink alcohol. This is especially important if you are taking acetaminophen.  Keep all follow-up visits as directed by your health care provider. This is important. Contact a health care provider if:  You have fatigue or weakness that is getting worse.  You develop swelling of the hands, feet, legs, or face.  You have a fever.  You develop loss of appetite.  You have nausea or vomiting.  You develop jaundice.  You develop easy bruising or bleeding. Get help right away if:  You vomit bright red blood or a material that looks like coffee grounds.  You have blood in your stools.  Your stools appear black and tarry.  You become confused.  You have chest pain or trouble breathing. This information is not intended to replace advice given to you by your health care provider. Make sure you discuss any questions you have with your health care provider. Document Released: 01/31/2005 Document Revised: 06/11/2015 Document Reviewed: 10/09/2013 Elsevier Interactive Patient Education  [employer].

## 2017-05-30 NOTE — Progress Notes (Addendum)
Patient informed of orders to discontinue Foley catheter. Procedure explained to patient. Approximately 8 cc of clear fluid removed from retention balloon. F/C discontinued at approximately 1110 and patient tolerated well. Patient informed of medications and symptoms of discomfort with first micturition. Bedside commode placed near patient. Call light in reach.

## 2017-05-31 DIAGNOSIS — E119 Type 2 diabetes mellitus without complications: Secondary | ICD-10-CM | POA: Diagnosis not present

## 2017-05-31 DIAGNOSIS — E785 Hyperlipidemia, unspecified: Secondary | ICD-10-CM | POA: Diagnosis not present

## 2017-05-31 DIAGNOSIS — R2681 Unsteadiness on feet: Secondary | ICD-10-CM | POA: Diagnosis not present

## 2017-05-31 DIAGNOSIS — J90 Pleural effusion, not elsewhere classified: Secondary | ICD-10-CM | POA: Diagnosis not present

## 2017-05-31 DIAGNOSIS — C220 Liver cell carcinoma: Secondary | ICD-10-CM | POA: Diagnosis not present

## 2017-05-31 DIAGNOSIS — E118 Type 2 diabetes mellitus with unspecified complications: Secondary | ICD-10-CM | POA: Diagnosis not present

## 2017-05-31 DIAGNOSIS — J918 Pleural effusion in other conditions classified elsewhere: Secondary | ICD-10-CM | POA: Diagnosis not present

## 2017-05-31 DIAGNOSIS — I482 Chronic atrial fibrillation: Secondary | ICD-10-CM | POA: Diagnosis not present

## 2017-05-31 DIAGNOSIS — R262 Difficulty in walking, not elsewhere classified: Secondary | ICD-10-CM | POA: Diagnosis not present

## 2017-05-31 DIAGNOSIS — M6281 Muscle weakness (generalized): Secondary | ICD-10-CM | POA: Diagnosis not present

## 2017-05-31 DIAGNOSIS — K7460 Unspecified cirrhosis of liver: Secondary | ICD-10-CM | POA: Diagnosis not present

## 2017-05-31 DIAGNOSIS — I5033 Acute on chronic diastolic (congestive) heart failure: Secondary | ICD-10-CM | POA: Diagnosis not present

## 2017-05-31 DIAGNOSIS — G4700 Insomnia, unspecified: Secondary | ICD-10-CM | POA: Diagnosis not present

## 2017-05-31 DIAGNOSIS — J948 Other specified pleural conditions: Secondary | ICD-10-CM | POA: Diagnosis not present

## 2017-05-31 DIAGNOSIS — K7469 Other cirrhosis of liver: Secondary | ICD-10-CM | POA: Diagnosis not present

## 2017-05-31 DIAGNOSIS — K7290 Hepatic failure, unspecified without coma: Secondary | ICD-10-CM | POA: Diagnosis not present

## 2017-05-31 DIAGNOSIS — K7581 Nonalcoholic steatohepatitis (NASH): Secondary | ICD-10-CM | POA: Diagnosis not present

## 2017-05-31 LAB — BASIC METABOLIC PANEL
Anion gap: 11 (ref 5–15)
BUN: 18 mg/dL (ref 6–20)
CO2: 28 mmol/L (ref 22–32)
CREATININE: 0.92 mg/dL (ref 0.44–1.00)
Calcium: 8.4 mg/dL — ABNORMAL LOW (ref 8.9–10.3)
Chloride: 93 mmol/L — ABNORMAL LOW (ref 101–111)
GFR calc non Af Amer: 58 mL/min — ABNORMAL LOW (ref >60)
Glucose, Bld: 150 mg/dL — ABNORMAL HIGH (ref 65–99)
Potassium: 4.4 mmol/L (ref 3.5–5.1)
Sodium: 132 mmol/L — ABNORMAL LOW (ref 135–145)

## 2017-05-31 LAB — GLUCOSE, CAPILLARY
GLUCOSE-CAPILLARY: 232 mg/dL — AB (ref 65–99)
Glucose-Capillary: 161 mg/dL — ABNORMAL HIGH (ref 65–99)

## 2017-05-31 NOTE — Progress Notes (Signed)
PROGRESS NOTE    MANHATTAN MCCUEN  OZD:[id number] DOB: 1940-10-27 DOA: 05/23/2017 PCP: Galen Manila, MD    Brief Narrative:  77 year old female w/ a hx of DM2, COPD, CHF, NASH cirrhosis, hepatocellular carcinoma s/p ablation Jan 2018 and March 2019, and a recent hospitalization for a R transudative pleural effusion.She was discharge to a SNF where she developed severe dyspnea, refractory to diuretics. In the ED a CXR confirmed a large right pleural effusion.     Assessment & Plan:   Principal Problem:   Recurrent right pleural effusion Active Problems:   Hyperthyroidism   DM (diabetes mellitus) (Cheryl Velasquez)   Hyperlipidemia   Hypertension   Cirrhosis, non-alcoholic (HCC)   Other cirrhosis of liver (HCC)   Acute on chronic diastolic CHF (congestive heart failure) (HCC)  #1 recurrent right pleural effusion/hepatic hydrothorax Status post clinical improvement post thoracentesis with 1.8 L removed.  Pleural fluid which was obtained was not sent for lab studies to confirm a recurrent transudate.  Follow-up chest x-ray 4/12 suggested fluid had reaccumulated to a moderate extent already.  Diuresis increased markedly on 4/12 which patient is currently tolerating.  Blood pressure stable.  Follow-up chest x-ray 4/15 no stability of effusion since 4/12 suggesting aggressive diuresis adequate to keep effusion stable.  Repeat chest x-ray 4/16 with stability in size of effusion.  Renal function stable.  Continue current dose of Lasix and Aldactone.  If rapidly re-accumulates may need to consider TIPS.  2.  Chronic liver failure/Nash cirrhosis/hepatocellular carcinoma Stable.  No encephalopathy noted.  Child's Pugh 7-8 equal class B/meld 10  3.  Acute on chronic diastolic heart failure Improved with diuresis.  Blood pressure stable.  Renal function is stable.  Patient is -20.6-4 L during this hospitalization.  Current weight is 215 pounds from 248 pounds on admission.  Follow.  4.  Chronic atrial  fibrillation Currently rate controlled on propranolol.  Patient not on anticoagulation candidate due to risk of esophageal variceal bleeding.  Outpatient follow-up.  5.  Hyperlipidemia Statin has been discontinued secondary to significant liver disease.  6.  Diabetes mellitus type 2 Stable.  Globin A1c 9.1.  Continue sliding scale insulin.    DVT prophylaxis: SCDs Code Status: Full Family Communication: updated patient.  No family at bedside. Disposition Plan: Discharge to skilled nursing facility today.   Consultants:   None  Procedures:   Chest x-ray 05/23/2017, 05/26/2017, 05/29/2017, 05/30/2017  Which was found guided therapeutic right thoracentesis 05/23/2017 per interventional radiology with 1.8 L of pleural fluid removed.  Antimicrobials:   None   Subjective: Sitting up in chair.  Denies any chest pain.  No significant shortness of breath.  No abdominal pain.  No nausea.  No vomiting.  Tolerating current diet.  Objective: Vitals:   05/31/17 0600 05/31/17 0700 05/31/17 0800 05/31/17 0856  BP:    139/84  Pulse: 65 61 77 91  Resp:      Temp:    97.8 F (36.6 C)  TempSrc:    Oral  SpO2:      Weight:      Height:        Intake/Output Summary (Last 24 hours) at 05/31/2017 0927 Last data filed at 05/30/2017 1306 Gross per 24 hour  Intake -  Output 700 ml  Net -700 ml   Filed Weights   05/29/17 0500 05/30/17 0451 05/31/17 0530  Weight: 102 kg (224 lb 13.9 oz) 103.7 kg (228 lb 9.9 oz) 97.9 kg (215 lb 12.8 oz)  Examination:  General exam: Appears calm and comfortable  Respiratory system: Decreased breath sounds in the right base otherwise clear.  No wheezes, no crackles, no rhonchi. Respiratory effort normal. Cardiovascular system: S1 & S2 heard, RRR. No JVD, murmurs, rubs, gallops or clicks.  Trace lower extremity edema. Gastrointestinal system: Abdomen is nondistended, soft and nontender. No organomegaly or masses felt. Normal bowel sounds heard. Central  nervous system: Alert and oriented. No focal neurological deficits. Extremities: Symmetric 5 x 5 power. Skin: No rashes, lesions or ulcers Psychiatry: Judgement and insight appear normal. Mood & affect appropriate.     Data Reviewed: I have personally reviewed following labs and imaging studies  CBC: Recent Labs  Lab 05/26/17 1336 05/27/17 0250 05/29/17 0348 05/30/17 0502  WBC 4.2 3.1* 3.3* 3.2*  HGB 14.1 12.2 12.6 13.2  HCT 41.0 35.9* 38.2 39.2  MCV 89.9 88.4 90.5 89.5  PLT 107* 50* 51* 52*   Basic Metabolic Panel: Recent Labs  Lab 05/26/17 0413 05/27/17 0250 05/28/17 0418 05/29/17 0348 05/30/17 0502 05/31/17 0225  NA 133* 132* 132* 134* 135 132*  K 3.4* 3.2* 3.7 4.2 4.3 4.4  CL 91* 90* 91* 93* 94* 93*  CO2 34* 32 31 30 31 28   GLUCOSE 172* 173* 131* 154* 153* 150*  BUN 13 11 13 11 15 18   CREATININE 0.90 0.76 0.81 0.82 0.97 0.92  CALCIUM 7.9* 7.7* 7.7* 8.0* 8.6* 8.4*  MG 1.5* 1.7 1.6* 1.8 1.9  --    GFR: Estimated Creatinine Clearance: 54.2 mL/min (by C-G formula based on SCr of 0.92 mg/dL). Liver Function Tests: Recent Labs  Lab 05/26/17 0413 05/27/17 0250  AST 40 44*  ALT 31 32  ALKPHOS 66 69  BILITOT 1.4* 1.2  PROT 5.1* 5.4*  ALBUMIN 2.3* 2.4*   No results for input(s): LIPASE, AMYLASE in the last 168 hours. No results for input(s): AMMONIA in the last 168 hours. Coagulation Profile: Recent Labs  Lab 05/27/17 0250  INR 1.32   Cardiac Enzymes: No results for input(s): CKTOTAL, CKMB, CKMBINDEX, TROPONINI in the last 168 hours. BNP (last 3 results) No results for input(s): PROBNP in the last 8760 hours. HbA1C: No results for input(s): HGBA1C in the last 72 hours. CBG: Recent Labs  Lab 05/30/17 0617 05/30/17 1113 05/30/17 1628 05/30/17 2002 05/31/17 0619  GLUCAP 163* 179* 238* 229* 161*   Lipid Profile: No results for input(s): CHOL, HDL, LDLCALC, TRIG, CHOLHDL, LDLDIRECT in the last 72 hours. Thyroid Function Tests: No results for  input(s): TSH, T4TOTAL, FREET4, T3FREE, THYROIDAB in the last 72 hours. Anemia Panel: No results for input(s): VITAMINB12, FOLATE, FERRITIN, TIBC, IRON, RETICCTPCT in the last 72 hours. Sepsis Labs: No results for input(s): PROCALCITON, LATICACIDVEN in the last 168 hours.  No results found for this or any previous visit (from the past 240 hour(s)).       Radiology Studies: Dg Chest Port 1 View  Result Date: 05/30/2017 CLINICAL DATA:  Pleural effusion EXAM: PORTABLE CHEST 1 VIEW COMPARISON:  05/29/2017 FINDINGS: Moderate layering right pleural effusion with right lower lobe atelectasis or infiltrate. Cardiomegaly. Minimal left base atelectasis. No left effusion. No acute bony abnormality. IMPRESSION: Stable moderate layering right pleural effusion with right lower lobe atelectasis or infiltrate. Minimal left base atelectasis. Electronically Signed   By: Rolm Baptise M.D.   On: 05/30/2017 07:30        Scheduled Meds: . calcium-vitamin D  1 tablet Oral Daily  . escitalopram  10 mg Oral Daily  . famotidine  20 mg Oral Daily  . furosemide  80 mg Oral TID  . insulin aspart  0-15 Units Subcutaneous TID WC  . insulin aspart  0-5 Units Subcutaneous QHS  . mouth rinse  15 mL Mouth Rinse BID  . multivitamin with minerals  1 tablet Oral Daily  . potassium chloride  40 mEq Oral BID  . propranolol  5 mg Oral BID  . QUEtiapine  25 mg Oral QHS  . simvastatin  40 mg Oral QHS  . spironolactone  100 mg Oral Daily   Continuous Infusions:   LOS: 8 days    Time spent: 35 minutes    Irine Seal, MD [facility] Pager [phone]  If 7PM-7AM, please contact night-coverage [email or web address] Password [facility] 05/31/2017, 9:27 AM

## 2017-05-31 NOTE — Clinical Social Work Placement (Signed)
   CLINICAL SOCIAL WORK PLACEMENT  NOTE  Date:  05/31/2017  Patient Details  Name: Cheryl Velasquez MRN: [id number] Date of Birth: Nov 15, 1940  Clinical Social Work is seeking post-discharge placement for this patient at the [place] level of care (*CSW will initial, date and re-position this form in  chart as items are completed):  Yes   Patient/family provided with [facility] Work Department's list of facilities offering this level of care within the geographic area requested by the patient (or if unable, by the patient's family).  Yes   Patient/family informed of their freedom to choose among providers that offer the needed level of care, that participate in Medicare, Medicaid or managed care program needed by the patient, have an available bed and are willing to accept the patient.      Patient/family informed of 's ownership interest in [facility] and [facility], as well as of the fact that they are under no obligation to receive care at these facilities.  PASRR submitted to EDS on       PASRR number received on       Existing PASRR number confirmed on 05/31/17     FL2 transmitted to all facilities in geographic area requested by pt/family on 05/31/17     FL2 transmitted to all facilities within larger geographic area on       Patient informed that his/her managed care company has contracts with or will negotiate with certain facilities, including the following:        Yes   Patient/family informed of bed offers received.  Patient chooses bed at [facility]     Physician recommends and patient chooses bed at      Patient to be transferred to [facility] on  .  Patient to be transferred to facility by (niece husband)     Patient family notified on 05/31/17 of transfer.  Name of family member notified:  linda     PHYSICIAN       Additional Comment:     _______________________________________________ Wende Neighbors, LCSW 05/31/2017, 8:35 AM

## 2017-05-31 NOTE — Progress Notes (Signed)
Clinical Social Worker facilitated patient discharge including contacting patient family and facility to confirm patient discharge plans.  Clinical information faxed to facility and family agreeable with plan.  CSW spoke with patients niece lastnight, per niece Vaughan Basta) her husband will pick up patient around 1pm and transport her to [facility] .  RN to call [phone] ask for 400 hall nurse for report prior to discharge.  Clinical Social Worker will sign off for now as social work intervention is no longer needed. Please consult [place] again if new need arises.  Rhea Pink, MSW, [place]

## 2017-05-31 NOTE — Progress Notes (Signed)
Inpatient Diabetes Program Recommendations  AACE/ADA: New Consensus Statement on Inpatient Glycemic Control (2015)  Target Ranges:  Prepandial:   less than 140 mg/dL      Peak postprandial:   less than 180 mg/dL (1-2 hours)      Critically ill patients:  140 - 180 mg/dL   Results for Cheryl Velasquez, Cheryl Velasquez (MRN [id number]) as of 05/31/2017 11:27  Ref. Range 05/30/2017 06:17 05/30/2017 11:13 05/30/2017 16:28 05/30/2017 20:02 05/31/2017 06:19  Glucose-Capillary Latest Ref Range: 65 - 99 mg/dL 163 (H) 179 (H) 238 (H) 229 (H) 161 (H)  Results for Cheryl Velasquez, Cheryl Velasquez (MRN [id number]) as of 05/31/2017 11:27  Ref. Range 05/29/2017 06:12 05/29/2017 11:44 05/29/2017 16:30 05/29/2017 20:55  Glucose-Capillary Latest Ref Range: 65 - 99 mg/dL 156 (H) 184 (H) 173 (H) 276 (H)   Review of Glycemic Control  Diabetes history:Type 2 DM Outpatient Diabetes medications:Novolog 5 units TID, Lantus 20 units QHS Current orders for Inpatient glycemic control:Novolog 0-15 units TID, Novolog 0-5 units QHS  Inpatient Diabetes Program Recommendations:   Insulin-Meal Coverage: Post prandial glucose is consistently elevated. Please consider ordering Novolog 4 units TID with meals for meal coverage if patient eats at least 50% of meals.  Thanks, Barnie Alderman, RN, MSN, CDE Diabetes Coordinator Inpatient Diabetes Program [phone] (Team Pager from 8am to 5pm)

## 2017-05-31 NOTE — Progress Notes (Signed)
Report called to Carmelia Bake, LPN at [facility] at this time.   Emelda Fear, RN

## 2017-05-31 NOTE — Progress Notes (Signed)
Occupational Therapy Treatment Patient Details Name: Cheryl Velasquez MRN: [id number] DOB: 1941/01/06 Today's Date: 05/31/2017    History of present illness Pt is a 77 y.o. female recently d/c from [facility] after admission for dyspnea and R sided pleural effusion. She went to a rehab center, and on 05/22/17 had SOB that did not improve with lasix and O2 supplementation; admitted to ED 4/9 found to be hypoxic with R pleural effusion. PMH includes OA, R breast CA, dyspnea, HTN, liver cirrhosis with NASH, DM, hernia repair.    OT comments  Pt progressing towards OT goals this session. Pt able to ambulate to sink to perform grooming tasks to work on activity tolerance as well as received energy conservation education (handout provided as well). Then Pt requested to walk down the hall and back. VSS throughout session. Pt is eager to progress and work more with therapy. Pt continues to demonstrate cognitive deficits in problem solving.    Follow Up Recommendations  SNF;Supervision/Assistance - 24 hour    Equipment Recommendations  None recommended by OT    Recommendations for Other Services      Precautions / Restrictions Precautions Precautions: Fall Precaution Comments: watch O2/BP Restrictions Weight Bearing Restrictions: No       Mobility Bed Mobility               General bed mobility comments: OOB in recliner  Transfers Overall transfer level: Needs assistance Equipment used: Rolling walker (2 wheeled) Transfers: Sit to/from Stand Sit to Stand: Supervision         General transfer comment: Supervision for safety    Balance Overall balance assessment: Needs assistance Sitting-balance support: No upper extremity supported;Feet supported Sitting balance-Leahy Scale: Fair     Standing balance support: During functional activity;No upper extremity supported Standing balance-Leahy Scale: Fair                             ADL either performed or assessed with  clinical judgement   ADL Overall ADL's : Needs assistance/impaired     Grooming: Oral care;Wash/dry hands;Wash/dry face;Min guard;Standing Grooming Details (indicate cue type and reason): sink level         Upper Body Dressing : Minimal assistance;Sitting Upper Body Dressing Details (indicate cue type and reason): to don backwards hospital gown Lower Body Dressing: Moderate assistance;Sit to/from stand Lower Body Dressing Details (indicate cue type and reason): Pt able to adjust socks sitting in recliner, unable to manage pulling up clothes and balance Toilet Transfer: Min guard;Ambulation;RW Toilet Transfer Details (indicate cue type and reason): Pt has been using the [facility] independently in the room Toileting- Clothing Manipulation and Hygiene: Moderate assistance;Sit to/from stand       Functional mobility during ADLs: Min guard;Rolling walker General ADL Comments: pt standing at sink to complete x3 grooming tasks and completing toileting; educated pt on energy conservation during ADL completion with pt verbalizing understanding;     Vision       Perception     Praxis      Cognition Arousal/Alertness: Awake/alert Behavior During Therapy: WFL for tasks assessed/performed;Flat affect Overall Cognitive Status: Impaired/Different from baseline Area of Impairment: Memory;Following commands;Safety/judgement;Awareness;Problem solving                     Memory: Decreased short-term memory Following Commands: Follows multi-step commands inconsistently;Follows one step commands with increased time Safety/Judgement: Decreased awareness of safety Awareness: Emergent Problem Solving: Slow processing;Requires verbal cues General  Comments: requires cues and increased time for basic ADL this session        Exercises     Shoulder Instructions       General Comments      Pertinent Vitals/ Pain       Pain Assessment: No/denies pain Faces Pain Scale: No hurt  Home  Living                                          Prior Functioning/Environment              Frequency  Min 2X/week        Progress Toward Goals  OT Goals(current goals can now be found in the care plan section)  Progress towards OT goals: Progressing toward goals  Acute Rehab OT Goals Patient Stated Goal: get back to independent  Plan Discharge plan remains appropriate    Co-evaluation                 AM-PAC PT "6 Clicks" Daily Activity     Outcome Measure   Help from another person eating meals?: None Help from another person taking care of personal grooming?: A Little Help from another person toileting, which includes using toliet, bedpan, or urinal?: A Lot Help from another person bathing (including washing, rinsing, drying)?: A Lot Help from another person to put on and taking off regular upper body clothing?: A Little Help from another person to put on and taking off regular lower body clothing?: A Lot 6 Click Score: 16    End of Session Equipment Utilized During Treatment: Rolling walker;Gait belt  OT Visit Diagnosis: Unsteadiness on feet (R26.81);Muscle weakness (generalized) (M62.81)   Activity Tolerance Patient tolerated treatment well   Patient Left in chair;with call bell/phone within reach;with family/visitor present   Nurse Communication Mobility status        Time: 7416-3845 OT Time Calculation (min): 25 min  Charges: OT General Charges $OT Visit: 1 Visit OT Treatments $Self Care/Home Management : 8-22 mins $Therapeutic Activity: 8-22 mins  Hulda Humphrey OTR/L [place] 05/31/2017, 11:40 AM

## 2017-05-31 NOTE — Care Management Note (Signed)
Case Management Note Marvetta Gibbons RN, BSN Unit 4E-Case Manager [phone]  Patient Details  Name: Cheryl Velasquez MRN: [id number] Date of Birth: [birth year]  Subjective/Objective:   Pt admitted with Resp. Failure, pl. Effusion, afib                 Action/Plan: PTA pt was at [facility] in [place]- pt does not have 24/7 supervision/assistance for d/c home- has a niece that can assist when pt is more independent as niece works.  CSW following for return to SNF when medically stable.   Expected Discharge Date:  05/31/17               Expected Discharge Plan:  [place]  In-House Referral:  Clinical Social Work  Discharge planning Services  CM Consult  Post Acute Care Choice:    Choice offered to:     DME Arranged:    DME Agency:     HH Arranged:    [place] Agency:     Status of Service:  Completed, signed off  If discussed at [employer] of Stay Meetings, dates discussed:  4/16  Discharge Disposition: skilled facility  Additional Comments:  05/31/17- 1050- Omarr Hann RN, CM- pt stable for transition to SNF today- CSW following for placement needs- pt to go to [facility].    Dawayne Patricia, RN 05/31/2017, 10:53 AM

## 2017-05-31 NOTE — Progress Notes (Signed)
IV and telemetry discontinued at this time. Discharge instruction/paperwork given to son to take to SNF. All questions answered at this time.

## 2017-06-07 DIAGNOSIS — K7581 Nonalcoholic steatohepatitis (NASH): Secondary | ICD-10-CM | POA: Diagnosis not present

## 2017-06-07 DIAGNOSIS — J918 Pleural effusion in other conditions classified elsewhere: Secondary | ICD-10-CM | POA: Diagnosis not present

## 2017-06-07 DIAGNOSIS — E785 Hyperlipidemia, unspecified: Secondary | ICD-10-CM | POA: Diagnosis not present

## 2017-06-07 DIAGNOSIS — C220 Liver cell carcinoma: Secondary | ICD-10-CM | POA: Diagnosis not present

## 2017-06-08 ENCOUNTER — Ambulatory Visit
Admit: 2017-06-08 | Discharge: 2017-06-08 | Disposition: A | Discharge status: Home / Self Care | Payer: Medicare Other | Attending: Radiology | Admitting: Radiology

## 2017-06-08 ENCOUNTER — Encounter: Payer: Self-pay | Admitting: Radiology

## 2017-06-08 DIAGNOSIS — C220 Liver cell carcinoma: Secondary | ICD-10-CM

## 2017-06-08 HISTORY — PX: IR RADIOLOGIST EVAL & MGMT: IMG5224

## 2017-06-08 NOTE — Progress Notes (Signed)
Chief Complaint: Status post thermal ablation of recurrent left lobe hepatocellular carcinoma on 04/26/2017.  History of Present Illness: Cheryl Velasquez is a 77 y.o. female with a history of hepatocellular carcinoma and prior thermal ablation of a segment IVA carcinoma on 03/11/2016.  There was evidence of recurrence along the anterior and inferior margin of prior ablation by imaging with rising AFP level.  She underwent a second ablation procedure on 04/26/2017.  After overnight observation and discharge from the hospital she was readmitted with shortness of breath secondary to an enlarging right pleural effusion.  She did undergo thoracentesis and hospitalization and is now in rehab.  She has been recovering well in rehab and dyspnea has resolved.  She has lost some weight over the course of recovery.  She denies any abdominal pain.  Past Medical History:  Diagnosis Date  . Arthritis    "fingers, left knee" (05/03/2017)  . Cancer of right breast ([place]) 2005  . Dyspnea    shortness of breath with any exertion  . GERD (gastroesophageal reflux disease)   . Hepatocellular carcinoma ([place])    segment 4 hepatocellular carcinoma/notes 04/26/2017  . History of chicken pox   . History of transfusion of platelets    "w/liver treatments"  . Hyperlipidemia   . Hypertension   . Hypothyroidism   . Liver cirrhosis secondary to NASH ([place])    Archie Endo 04/26/2017  . Thyroid disease   . Type II diabetes mellitus ([place])     Past Surgical History:  Procedure Laterality Date  . ABDOMINAL HYSTERECTOMY  1987  . BREAST BIOPSY Right 2005  . BREAST LUMPECTOMY Right 2005  . CATARACT EXTRACTION W/ INTRAOCULAR LENS  IMPLANT, BILATERAL Bilateral   . ESOPHAGEAL BANDING N/A 12/13/2012   Procedure: ESOPHAGEAL BANDING;  Surgeon: Rogene Houston, MD;  [facility];  Service: Endoscopy;  Laterality: N/A;  . ESOPHAGOGASTRODUODENOSCOPY N/A 09/20/2012   Procedure: ESOPHAGOGASTRODUODENOSCOPY (EGD);  Surgeon:  Rogene Houston, MD;  [facility];  Service: Endoscopy;  Laterality: N/A;  125-moved to 12:00 Ann notified pt  . ESOPHAGOGASTRODUODENOSCOPY N/A 12/13/2012   Procedure: ESOPHAGOGASTRODUODENOSCOPY (EGD);  Surgeon: Rogene Houston, MD;  [facility];  Service: Endoscopy;  Laterality: N/A;  155-moved to 220 Ann to notify pt  . ESOPHAGOGASTRODUODENOSCOPY N/A 01/11/2016   Procedure: ESOPHAGOGASTRODUODENOSCOPY (EGD);  Surgeon: Rogene Houston, MD;  [facility];  Service: Endoscopy;  Laterality: N/A;  7:30  . HERNIA REPAIR  2004   "belly button"  . IR GENERIC HISTORICAL  01/19/2016   IR RADIOLOGIST EVAL & MGMT 01/19/2016 Aletta Edouard, MD [facility] INTERV RAD  . IR GENERIC HISTORICAL  03/29/2016   IR RADIOLOGIST EVAL & MGMT 03/29/2016 Aletta Edouard, MD [facility] INTER[facility] RAD  . IR RADIOLOGIST EVAL & MGMT  06/14/2016  . IR RADIOLOGIST EVAL & MGMT  11/24/2016  . IR RADIOLOGIST EVAL & MGMT  12/13/2016  . IR RADIOLOGIST EVAL & MGMT  03/15/2017  . IR RADIOLOGIST EVAL & MGMT  06/08/2017  . IR RADIOLOGY PERIPHERAL GUIDED IV START  01/18/2017  . IR THORACENTESIS ASP PLEURAL SPACE W/IMG GUIDE  05/23/2017  . IR US GUIDE VASC ACCESS LEFT  01/18/2017  . RADIOFREQUENCY ABLATION N/A 03/11/2016   Procedure: LIVER THERMAL ABLATION;  Surgeon: Aletta Edouard, MD;  Location: [facility];  Service: Anesthesiology;  Laterality: N/A;  . RADIOLOGY WITH ANESTHESIA N/A 04/26/2017   Procedure: CT MICROWAVE THERMAL ABLATION LIVER;  Surgeon: Aletta Edouard, MD;  Location: [facility];  Service: Radiology;  Laterality: N/A;  . SKIN CANCER EXCISION     "face"  . thyroid treatment     radioactive iodine treatment    Allergies: Patient has no known allergies.  Medications: Prior to Admission medications   Medication Sig Start Date End Date Taking? Authorizing Provider  calcium-vitamin D (OSCAL WITH D) 500-200 MG-UNIT tablet Take 1 tablet by mouth daily. 05/31/17  Yes [name], MD  furosemide (LASIX) 80 MG  tablet Take 1 tablet (80 mg total) by mouth 3 (three) times daily. 05/30/17  Yes [name], MD  glipiZIDE (GLUCOTROL) 5 MG tablet Take by mouth daily before breakfast.   Yes [provider]  insulin aspart (NOVOLOG) 100 UNIT/ML injection Inject 0-5 Units into the skin at bedtime. 05/30/17  Yes [name], MD  insulin aspart (NOVOLOG) 100 UNIT/ML injection Inject 0-15 Units into the skin 3 (three) times daily with meals. 05/30/17  Yes [name], MD  insulin glargine (LANTUS) 100 UNIT/ML injection Inject 20 Units into the skin at bedtime.   Yes [provider]  metFORMIN (GLUCOPHAGE) 1000 MG tablet Take 1,000 mg by mouth 2 (two) times daily with a meal.   Yes [provider]  Multiple Vitamin (MULTIVITAMIN WITH MINERALS) TABS tablet Take 1 tablet by mouth daily. 05/31/17  Yes [name], MD  pantoprazole (PROTONIX) 40 MG tablet TAKE 1 TABLET BY MOUTH EVERY DAY Patient taking differently: Take 40 mg by mouth once a day in the evening 01/09/17  Yes Rehman, Mechele Dawley, MD  potassium chloride SA (K-DUR,KLOR-CON) 20 MEQ tablet Take 2 tablets (40 mEq total) by mouth 2 (two) times daily. 05/30/17  Yes [name], MD  propranolol (INDERAL) 10 MG tablet Take 0.5 tablets (5 mg total) by mouth 2 (two) times daily. 05/30/17  Yes [name], MD  spironolactone (ALDACTONE) 100 MG tablet Take 1 tablet (100 mg total) by mouth daily. 05/31/17  Yes [name], MD  temazepam (RESTORIL) 15 MG capsule Take 15 mg by mouth at bedtime as needed for sleep.   Yes [provider]  QUEtiapine (SEROQUEL) 25 MG tablet Take 1 tablet (25 mg total) by mouth at bedtime. Patient not taking: Reported on 06/08/2017 05/30/17   [name], MD     Family History  Problem Relation Age of Onset  . Dementia Sister   . Dementia Sister   . Alzheimer's disease Sister   . Liver disease Brother   . Cancer Other        Colon Cancer, Prostate  Cancer-Other Blood Relative  . Hyperlipidemia Other        Other Blood Relative  . Stroke Other        Parent, Other Blood Relative  . Diabetes Other        Parent, Other Blood Relative  . Hypertension Other        Parent  . Prostate cancer Maternal Aunt   . Dementia Maternal Aunt     Social History   Socioeconomic History  . Marital status: Single    Spouse name: Not on file  . Number of children: 0  . Years of education: 10  . Highest education level: Not on file  Occupational History  . Occupation: Copy  . Financial resource strain: Not on file  . Food insecurity:    Worry: Not on file    Inability: Not on file  . Transportation needs:    Medical: Not  on file    Non-medical: Not on file  Tobacco Use  . Smoking status: Never Smoker  . Smokeless tobacco: Never Used  Substance and Sexual Activity  . Alcohol use: No    Alcohol/week: 0.0 oz  . Drug use: No  . Sexual activity: Never  Lifestyle  . Physical activity:    Days per week: Not on file    Minutes per session: Not on file  . Stress: Not on file  Relationships  . Social connections:    Talks on phone: Not on file    Gets together: Not on file    Attends religious service: Not on file    Active member of club or organization: Not on file    Attends meetings of clubs or organizations: Not on file    Relationship status: Not on file  Other Topics Concern  . Not on file  Social History Narrative   Regular Exercise-no    ECOG Status: 1 - Symptomatic but completely ambulatory  Review of Systems: A 12 point ROS discussed and pertinent positives are indicated in the HPI above.  All other systems are negative.  Review of Systems  Constitutional: Positive for unexpected weight change.  Respiratory: Negative.   Cardiovascular: Negative.   Gastrointestinal: Negative.   Genitourinary: Negative.   Musculoskeletal: Negative.   Neurological: Negative.     Vital Signs: BP 132/77   Pulse  85   Temp 98.1 F (36.7 C) (Oral)   Resp 16   Ht 5' 2"  (1.575 m)   Wt 212 lb (96.2 kg)   SpO2 98%   BMI 38.78 kg/m   Physical Exam  Constitutional: She is oriented to person, place, and time. No distress.  Abdominal: Soft. There is no tenderness. There is no rebound.  Neurological: She is alert and oriented to person, place, and time.  Skin: She is not diaphoretic.  Vitals reviewed.   Imaging: Dg Chest 1 View  Result Date: 05/23/2017 CLINICAL DATA:  Post thoracentesis EXAM: CHEST  1 VIEW COMPARISON:  05/23/2017, 05/05/2017 FINDINGS: Small residual right-sided pleural effusion, decreased compared to prior radiograph. No definitive pneumothorax is seen. Airspace disease at the right middle lobe and right base. Cardiomegaly. Aortic atherosclerosis. Partial atelectasis or consolidation at the medial left base. IMPRESSION: 1. Decreased right pleural effusion with small residual noted. No right pneumothorax 2. Airspace disease within the medial left base, right middle lobe and right base which may reflect atelectasis or pneumonia 3. Cardiomegaly Electronically Signed   By: Donavan Foil M.D.   On: 05/23/2017 16:26   Dg Chest Port 1 View  Result Date: 05/30/2017 CLINICAL DATA:  Pleural effusion EXAM: PORTABLE CHEST 1 VIEW COMPARISON:  05/29/2017 FINDINGS: Moderate layering right pleural effusion with right lower lobe atelectasis or infiltrate. Cardiomegaly. Minimal left base atelectasis. No left effusion. No acute bony abnormality. IMPRESSION: Stable moderate layering right pleural effusion with right lower lobe atelectasis or infiltrate. Minimal left base atelectasis. Electronically Signed   By: Rolm Baptise M.D.   On: 05/30/2017 07:30   Dg Chest Port 1 View  Result Date: 05/29/2017 CLINICAL DATA:  Pleural effusion. EXAM: PORTABLE CHEST 1 VIEW COMPARISON:  Radiograph of May 26, 2017. FINDINGS: Stable cardiomediastinal silhouette. No pneumothorax is noted. Stable moderate right-sided pleural  effusion is noted with associated atelectasis or infiltrate. Minimal left basilar subsegmental atelectasis. Bony thorax is unremarkable. IMPRESSION: Stable moderate right pleural effusion with associated atelectasis or infiltrate. Electronically Signed   By: Marijo Conception, M.D.  On: 05/29/2017 07:58   Dg Chest Port 1 View  Result Date: 05/26/2017 CLINICAL DATA:  77 year old female with shortness of breath. Recurrent right pleural effusion status post ultrasound guided right thoracentesis on 05/23/2017. EXAM: PORTABLE CHEST 1 VIEW COMPARISON:  05/23/2017 and earlier. FINDINGS: Portable AP semi upright view at 0647 hours. Veiling opacity and pleural fluid in the right minor fissure have increased since 05/23/2017. The effusion now appears moderate. No pneumothorax. Superimposed bibasilar atelectasis. Stable cardiomegaly and mediastinal contours. Pulmonary vascularity appears increased, but without overt edema. Calcified aortic atherosclerosis. IMPRESSION: 1. Some re-accumulation of the right pleural effusion since 05/23/2017, probably moderate in volume. 2. Bibasilar atelectasis. Interval increased pulmonary vascularity but no overt edema. Electronically Signed   By: Genevie Ann M.D.   On: 05/26/2017 08:21   Dg Chest Port 1 View  Result Date: 05/23/2017 CLINICAL DATA:  Respiratory failure, shortness of breath. EXAM: PORTABLE CHEST 1 VIEW COMPARISON:  Portable chest x-ray of May 05, 2016 FINDINGS: There has been further increase in the volume of the right-sided pleural effusion. There is shift of the mediastinum toward the left. There is no significant left-sided effusion. The interstitial markings of both lungs are increased. The central pulmonary vascularity is mildly prominent. The visualized portions of the cardiac silhouette suggests that it is mildly enlarged. There is calcification in the wall of the thoracic aorta. There are surgical clips in the right axillary region. IMPRESSION: Interval increase in  the volume of the right-sided pleural effusion which now occupies approximately 2/3 of the pleural space volume. There is mild shift of the mediastinum toward the left. Probable mild pulmonary vascular congestion. Thoracic aortic atherosclerosis. Electronically Signed   By: David  Martinique M.D.   On: 05/23/2017 07:41   Ir Radiologist Eval & Mgmt  Result Date: 06/08/2017 Please refer to notes tab for details about interventional procedure. (Op Note)  Ir Thoracentesis Asp Pleural Space W/img Guide  Result Date: 05/23/2017 INDICATION: Patient with recurring right pleural effusion. Request is made for therapeutic thoracentesis. EXAM: ULTRASOUND GUIDED THERAPEUTIC RIGHT THORACENTESIS MEDICATIONS: 10 mL 2% lidocaine COMPLICATIONS: None immediate. PROCEDURE: An ultrasound guided thoracentesis was thoroughly discussed with the patient and questions answered. The benefits, risks, alternatives and complications were also discussed. The patient understands and wishes to proceed with the procedure. Written consent was obtained. Ultrasound was performed to localize and mark an adequate pocket of fluid in the right chest. The area was then prepped and draped in the normal sterile fashion. 2% lidocaine was used for local anesthesia. Under ultrasound guidance a Safe-T-Centesis catheter was introduced. Thoracentesis was performed. The catheter was removed and a dressing applied. FINDINGS: A total of approximately 1.8 liters of clear, yellow fluid was removed. IMPRESSION: Successful ultrasound guided therapeutic right thoracentesis yielding 1.8 liters of pleural fluid. Read by: Brynda Greathouse PA-C Electronically Signed   By: Jerilynn Mages.  Shick M.D.   On: 05/23/2017 16:39    Labs:  CBC: Recent Labs    05/26/17 1336 05/27/17 0250 05/29/17 0348 05/30/17 0502  WBC 4.2 3.1* 3.3* 3.2*  HGB 14.1 12.2 12.6 13.2  HCT 41.0 35.9* 38.2 39.2  PLT 107* 50* 51* 52*    COAGS: Recent Labs    04/20/17 1209 05/27/17 0250  INR 1.24  1.32  APTT 32  --     BMP: Recent Labs    05/28/17 0418 05/29/17 0348 05/30/17 0502 05/31/17 0225  NA 132* 134* 135 132*  K 3.7 4.2 4.3 4.4  CL 91* 93* 94* 93*  CO2 31  30 31 28   GLUCOSE 131* 154* 153* 150*  BUN 13 11 15 18   CALCIUM 7.7* 8.0* 8.6* 8.4*  CREATININE 0.81 0.82 0.97 0.92  GFRNONAA >60 >60 54* 58*  GFRAA >60 >60 >60 >60    LIVER FUNCTION TESTS: Recent Labs    05/03/17 2122 05/23/17 0717 05/26/17 0413 05/27/17 0250  BILITOT 1.4* 1.3* 1.4* 1.2  AST 48* 47* 40 44*  ALT 34 30 31 32  ALKPHOS 76 65 66 69  PROT 6.8 5.7* 5.1* 5.4*  ALBUMIN 3.0* 2.6* 2.3* 2.4*    TUMOR MARKERS: Recent Labs    06/20/16 0932 10/04/16 1320  AFPTM 24.2* 45.9*    Assessment and Plan:  Mrs. Hausler is now doing better after requiring hospitalization following her second liver ablation.  She definitely did not tolerate the second procedure as well.  She has had some evidence of some right pleural fluid since October, 2018.  Recent increase in right pleural fluid volume after ablation may have related to some degree of decompensation of cirrhosis resulting in hepatic hydrothorax.  There may also have been some degree of diaphragmatic irritation that resulted in sympathetic accumulation of right pleural fluid after ablation.  Mrs. Davitt's niece believes that she will still be in rehab for about another 2 weeks.  I recommended a follow-up MRI of the abdomen in June or July.  I recommended follow-up with Dr. Laural Golden for recheck of AFP level as well as to discuss the previously planned EGD to evaluate for esophageal varices.  Electronically SignedAletta Edouard T 06/08/2017, 4:37 PM   I spent a total of 15 Minutes in face to face in clinical consultation, greater than 50% of which was counseling/coordinating care post ablation of recurrent hepatocellular carcinoma.

## 2017-06-12 DIAGNOSIS — I482 Chronic atrial fibrillation: Secondary | ICD-10-CM | POA: Diagnosis not present

## 2017-06-12 DIAGNOSIS — E785 Hyperlipidemia, unspecified: Secondary | ICD-10-CM | POA: Diagnosis not present

## 2017-06-12 DIAGNOSIS — C220 Liver cell carcinoma: Secondary | ICD-10-CM | POA: Diagnosis not present

## 2017-06-12 DIAGNOSIS — K7581 Nonalcoholic steatohepatitis (NASH): Secondary | ICD-10-CM | POA: Diagnosis not present

## 2017-06-17 DIAGNOSIS — K7460 Unspecified cirrhosis of liver: Secondary | ICD-10-CM | POA: Diagnosis not present

## 2017-06-17 DIAGNOSIS — E119 Type 2 diabetes mellitus without complications: Secondary | ICD-10-CM | POA: Diagnosis not present

## 2017-06-17 DIAGNOSIS — K7290 Hepatic failure, unspecified without coma: Secondary | ICD-10-CM | POA: Diagnosis not present

## 2017-06-17 DIAGNOSIS — K7581 Nonalcoholic steatohepatitis (NASH): Secondary | ICD-10-CM | POA: Diagnosis not present

## 2017-06-17 DIAGNOSIS — C220 Liver cell carcinoma: Secondary | ICD-10-CM | POA: Diagnosis not present

## 2017-06-17 DIAGNOSIS — G4700 Insomnia, unspecified: Secondary | ICD-10-CM | POA: Diagnosis not present

## 2017-06-17 DIAGNOSIS — Z7984 Long term (current) use of oral hypoglycemic drugs: Secondary | ICD-10-CM | POA: Diagnosis not present

## 2017-06-17 DIAGNOSIS — M6281 Muscle weakness (generalized): Secondary | ICD-10-CM | POA: Diagnosis not present

## 2017-06-17 DIAGNOSIS — E785 Hyperlipidemia, unspecified: Secondary | ICD-10-CM | POA: Diagnosis not present

## 2017-06-17 DIAGNOSIS — M1990 Unspecified osteoarthritis, unspecified site: Secondary | ICD-10-CM | POA: Diagnosis not present

## 2017-06-17 DIAGNOSIS — I5033 Acute on chronic diastolic (congestive) heart failure: Secondary | ICD-10-CM | POA: Diagnosis not present

## 2017-06-17 DIAGNOSIS — I482 Chronic atrial fibrillation: Secondary | ICD-10-CM | POA: Diagnosis not present

## 2017-06-17 DIAGNOSIS — E039 Hypothyroidism, unspecified: Secondary | ICD-10-CM | POA: Diagnosis not present

## 2017-06-17 DIAGNOSIS — K219 Gastro-esophageal reflux disease without esophagitis: Secondary | ICD-10-CM | POA: Diagnosis not present

## 2017-06-17 DIAGNOSIS — J449 Chronic obstructive pulmonary disease, unspecified: Secondary | ICD-10-CM | POA: Diagnosis not present

## 2017-06-17 DIAGNOSIS — I110 Hypertensive heart disease with heart failure: Secondary | ICD-10-CM | POA: Diagnosis not present

## 2017-06-19 ENCOUNTER — Ambulatory Visit: Payer: Medicare Other | Admitting: Endocrinology

## 2017-06-19 DIAGNOSIS — I5033 Acute on chronic diastolic (congestive) heart failure: Secondary | ICD-10-CM | POA: Diagnosis not present

## 2017-06-19 DIAGNOSIS — K7581 Nonalcoholic steatohepatitis (NASH): Secondary | ICD-10-CM | POA: Diagnosis not present

## 2017-06-19 DIAGNOSIS — K7460 Unspecified cirrhosis of liver: Secondary | ICD-10-CM | POA: Diagnosis not present

## 2017-06-19 DIAGNOSIS — E119 Type 2 diabetes mellitus without complications: Secondary | ICD-10-CM | POA: Diagnosis not present

## 2017-06-19 DIAGNOSIS — K7290 Hepatic failure, unspecified without coma: Secondary | ICD-10-CM | POA: Diagnosis not present

## 2017-06-19 DIAGNOSIS — I110 Hypertensive heart disease with heart failure: Secondary | ICD-10-CM | POA: Diagnosis not present

## 2017-06-20 DIAGNOSIS — E1165 Type 2 diabetes mellitus with hyperglycemia: Secondary | ICD-10-CM | POA: Diagnosis not present

## 2017-06-21 DIAGNOSIS — E119 Type 2 diabetes mellitus without complications: Secondary | ICD-10-CM | POA: Diagnosis not present

## 2017-06-21 DIAGNOSIS — K7581 Nonalcoholic steatohepatitis (NASH): Secondary | ICD-10-CM | POA: Diagnosis not present

## 2017-06-21 DIAGNOSIS — K7460 Unspecified cirrhosis of liver: Secondary | ICD-10-CM | POA: Diagnosis not present

## 2017-06-21 DIAGNOSIS — K7290 Hepatic failure, unspecified without coma: Secondary | ICD-10-CM | POA: Diagnosis not present

## 2017-06-21 DIAGNOSIS — I5033 Acute on chronic diastolic (congestive) heart failure: Secondary | ICD-10-CM | POA: Diagnosis not present

## 2017-06-21 DIAGNOSIS — I110 Hypertensive heart disease with heart failure: Secondary | ICD-10-CM | POA: Diagnosis not present

## 2017-06-22 DIAGNOSIS — K7460 Unspecified cirrhosis of liver: Secondary | ICD-10-CM | POA: Diagnosis not present

## 2017-06-22 DIAGNOSIS — K7581 Nonalcoholic steatohepatitis (NASH): Secondary | ICD-10-CM | POA: Diagnosis not present

## 2017-06-22 DIAGNOSIS — I110 Hypertensive heart disease with heart failure: Secondary | ICD-10-CM | POA: Diagnosis not present

## 2017-06-22 DIAGNOSIS — K7290 Hepatic failure, unspecified without coma: Secondary | ICD-10-CM | POA: Diagnosis not present

## 2017-06-22 DIAGNOSIS — I5033 Acute on chronic diastolic (congestive) heart failure: Secondary | ICD-10-CM | POA: Diagnosis not present

## 2017-06-22 DIAGNOSIS — E119 Type 2 diabetes mellitus without complications: Secondary | ICD-10-CM | POA: Diagnosis not present

## 2017-06-23 DIAGNOSIS — I110 Hypertensive heart disease with heart failure: Secondary | ICD-10-CM | POA: Diagnosis not present

## 2017-06-23 DIAGNOSIS — K7290 Hepatic failure, unspecified without coma: Secondary | ICD-10-CM | POA: Diagnosis not present

## 2017-06-23 DIAGNOSIS — I5033 Acute on chronic diastolic (congestive) heart failure: Secondary | ICD-10-CM | POA: Diagnosis not present

## 2017-06-23 DIAGNOSIS — E119 Type 2 diabetes mellitus without complications: Secondary | ICD-10-CM | POA: Diagnosis not present

## 2017-06-23 DIAGNOSIS — K7581 Nonalcoholic steatohepatitis (NASH): Secondary | ICD-10-CM | POA: Diagnosis not present

## 2017-06-23 DIAGNOSIS — K7460 Unspecified cirrhosis of liver: Secondary | ICD-10-CM | POA: Diagnosis not present

## 2017-06-24 DIAGNOSIS — K7460 Unspecified cirrhosis of liver: Secondary | ICD-10-CM | POA: Diagnosis not present

## 2017-06-24 DIAGNOSIS — K7290 Hepatic failure, unspecified without coma: Secondary | ICD-10-CM | POA: Diagnosis not present

## 2017-06-24 DIAGNOSIS — I110 Hypertensive heart disease with heart failure: Secondary | ICD-10-CM | POA: Diagnosis not present

## 2017-06-24 DIAGNOSIS — I5033 Acute on chronic diastolic (congestive) heart failure: Secondary | ICD-10-CM | POA: Diagnosis not present

## 2017-06-24 DIAGNOSIS — K7581 Nonalcoholic steatohepatitis (NASH): Secondary | ICD-10-CM | POA: Diagnosis not present

## 2017-06-24 DIAGNOSIS — E119 Type 2 diabetes mellitus without complications: Secondary | ICD-10-CM | POA: Diagnosis not present

## 2017-06-26 DIAGNOSIS — I5033 Acute on chronic diastolic (congestive) heart failure: Secondary | ICD-10-CM | POA: Diagnosis not present

## 2017-06-26 DIAGNOSIS — I110 Hypertensive heart disease with heart failure: Secondary | ICD-10-CM | POA: Diagnosis not present

## 2017-06-26 DIAGNOSIS — K7290 Hepatic failure, unspecified without coma: Secondary | ICD-10-CM | POA: Diagnosis not present

## 2017-06-26 DIAGNOSIS — K7460 Unspecified cirrhosis of liver: Secondary | ICD-10-CM | POA: Diagnosis not present

## 2017-06-26 DIAGNOSIS — K7581 Nonalcoholic steatohepatitis (NASH): Secondary | ICD-10-CM | POA: Diagnosis not present

## 2017-06-26 DIAGNOSIS — E119 Type 2 diabetes mellitus without complications: Secondary | ICD-10-CM | POA: Diagnosis not present

## 2017-06-28 DIAGNOSIS — I5033 Acute on chronic diastolic (congestive) heart failure: Secondary | ICD-10-CM | POA: Diagnosis not present

## 2017-06-28 DIAGNOSIS — I110 Hypertensive heart disease with heart failure: Secondary | ICD-10-CM | POA: Diagnosis not present

## 2017-06-28 DIAGNOSIS — K7290 Hepatic failure, unspecified without coma: Secondary | ICD-10-CM | POA: Diagnosis not present

## 2017-06-28 DIAGNOSIS — E119 Type 2 diabetes mellitus without complications: Secondary | ICD-10-CM | POA: Diagnosis not present

## 2017-06-28 DIAGNOSIS — K7581 Nonalcoholic steatohepatitis (NASH): Secondary | ICD-10-CM | POA: Diagnosis not present

## 2017-06-28 DIAGNOSIS — K7460 Unspecified cirrhosis of liver: Secondary | ICD-10-CM | POA: Diagnosis not present

## 2017-06-29 DIAGNOSIS — I5033 Acute on chronic diastolic (congestive) heart failure: Secondary | ICD-10-CM | POA: Diagnosis not present

## 2017-06-29 DIAGNOSIS — K7460 Unspecified cirrhosis of liver: Secondary | ICD-10-CM | POA: Diagnosis not present

## 2017-06-29 DIAGNOSIS — K7290 Hepatic failure, unspecified without coma: Secondary | ICD-10-CM | POA: Diagnosis not present

## 2017-06-29 DIAGNOSIS — I110 Hypertensive heart disease with heart failure: Secondary | ICD-10-CM | POA: Diagnosis not present

## 2017-06-29 DIAGNOSIS — E119 Type 2 diabetes mellitus without complications: Secondary | ICD-10-CM | POA: Diagnosis not present

## 2017-06-29 DIAGNOSIS — K7581 Nonalcoholic steatohepatitis (NASH): Secondary | ICD-10-CM | POA: Diagnosis not present

## 2017-06-30 DIAGNOSIS — K7290 Hepatic failure, unspecified without coma: Secondary | ICD-10-CM | POA: Diagnosis not present

## 2017-06-30 DIAGNOSIS — I5033 Acute on chronic diastolic (congestive) heart failure: Secondary | ICD-10-CM | POA: Diagnosis not present

## 2017-06-30 DIAGNOSIS — I110 Hypertensive heart disease with heart failure: Secondary | ICD-10-CM | POA: Diagnosis not present

## 2017-06-30 DIAGNOSIS — K7581 Nonalcoholic steatohepatitis (NASH): Secondary | ICD-10-CM | POA: Diagnosis not present

## 2017-06-30 DIAGNOSIS — K7460 Unspecified cirrhosis of liver: Secondary | ICD-10-CM | POA: Diagnosis not present

## 2017-06-30 DIAGNOSIS — E119 Type 2 diabetes mellitus without complications: Secondary | ICD-10-CM | POA: Diagnosis not present

## 2017-07-03 DIAGNOSIS — E119 Type 2 diabetes mellitus without complications: Secondary | ICD-10-CM | POA: Diagnosis not present

## 2017-07-03 DIAGNOSIS — K7460 Unspecified cirrhosis of liver: Secondary | ICD-10-CM | POA: Diagnosis not present

## 2017-07-03 DIAGNOSIS — K7290 Hepatic failure, unspecified without coma: Secondary | ICD-10-CM | POA: Diagnosis not present

## 2017-07-03 DIAGNOSIS — I5033 Acute on chronic diastolic (congestive) heart failure: Secondary | ICD-10-CM | POA: Diagnosis not present

## 2017-07-03 DIAGNOSIS — K7581 Nonalcoholic steatohepatitis (NASH): Secondary | ICD-10-CM | POA: Diagnosis not present

## 2017-07-03 DIAGNOSIS — I110 Hypertensive heart disease with heart failure: Secondary | ICD-10-CM | POA: Diagnosis not present

## 2017-07-04 DIAGNOSIS — K7460 Unspecified cirrhosis of liver: Secondary | ICD-10-CM | POA: Diagnosis not present

## 2017-07-04 DIAGNOSIS — I110 Hypertensive heart disease with heart failure: Secondary | ICD-10-CM | POA: Diagnosis not present

## 2017-07-04 DIAGNOSIS — K7581 Nonalcoholic steatohepatitis (NASH): Secondary | ICD-10-CM | POA: Diagnosis not present

## 2017-07-04 DIAGNOSIS — I5033 Acute on chronic diastolic (congestive) heart failure: Secondary | ICD-10-CM | POA: Diagnosis not present

## 2017-07-04 DIAGNOSIS — K7290 Hepatic failure, unspecified without coma: Secondary | ICD-10-CM | POA: Diagnosis not present

## 2017-07-04 DIAGNOSIS — E119 Type 2 diabetes mellitus without complications: Secondary | ICD-10-CM | POA: Diagnosis not present

## 2017-07-05 DIAGNOSIS — I5033 Acute on chronic diastolic (congestive) heart failure: Secondary | ICD-10-CM | POA: Diagnosis not present

## 2017-07-05 DIAGNOSIS — K7460 Unspecified cirrhosis of liver: Secondary | ICD-10-CM | POA: Diagnosis not present

## 2017-07-05 DIAGNOSIS — E119 Type 2 diabetes mellitus without complications: Secondary | ICD-10-CM | POA: Diagnosis not present

## 2017-07-05 DIAGNOSIS — K7290 Hepatic failure, unspecified without coma: Secondary | ICD-10-CM | POA: Diagnosis not present

## 2017-07-05 DIAGNOSIS — I110 Hypertensive heart disease with heart failure: Secondary | ICD-10-CM | POA: Diagnosis not present

## 2017-07-05 DIAGNOSIS — K7581 Nonalcoholic steatohepatitis (NASH): Secondary | ICD-10-CM | POA: Diagnosis not present

## 2017-07-06 DIAGNOSIS — K7581 Nonalcoholic steatohepatitis (NASH): Secondary | ICD-10-CM | POA: Diagnosis not present

## 2017-07-06 DIAGNOSIS — I5033 Acute on chronic diastolic (congestive) heart failure: Secondary | ICD-10-CM | POA: Diagnosis not present

## 2017-07-06 DIAGNOSIS — I110 Hypertensive heart disease with heart failure: Secondary | ICD-10-CM | POA: Diagnosis not present

## 2017-07-06 DIAGNOSIS — K7460 Unspecified cirrhosis of liver: Secondary | ICD-10-CM | POA: Diagnosis not present

## 2017-07-06 DIAGNOSIS — E119 Type 2 diabetes mellitus without complications: Secondary | ICD-10-CM | POA: Diagnosis not present

## 2017-07-06 DIAGNOSIS — K7290 Hepatic failure, unspecified without coma: Secondary | ICD-10-CM | POA: Diagnosis not present

## 2017-07-07 DIAGNOSIS — E119 Type 2 diabetes mellitus without complications: Secondary | ICD-10-CM | POA: Diagnosis not present

## 2017-07-07 DIAGNOSIS — I5033 Acute on chronic diastolic (congestive) heart failure: Secondary | ICD-10-CM | POA: Diagnosis not present

## 2017-07-07 DIAGNOSIS — K7290 Hepatic failure, unspecified without coma: Secondary | ICD-10-CM | POA: Diagnosis not present

## 2017-07-07 DIAGNOSIS — K7581 Nonalcoholic steatohepatitis (NASH): Secondary | ICD-10-CM | POA: Diagnosis not present

## 2017-07-07 DIAGNOSIS — K7460 Unspecified cirrhosis of liver: Secondary | ICD-10-CM | POA: Diagnosis not present

## 2017-07-07 DIAGNOSIS — I110 Hypertensive heart disease with heart failure: Secondary | ICD-10-CM | POA: Diagnosis not present

## 2017-07-11 DIAGNOSIS — K7581 Nonalcoholic steatohepatitis (NASH): Secondary | ICD-10-CM | POA: Diagnosis not present

## 2017-07-11 DIAGNOSIS — K7460 Unspecified cirrhosis of liver: Secondary | ICD-10-CM | POA: Diagnosis not present

## 2017-07-11 DIAGNOSIS — I110 Hypertensive heart disease with heart failure: Secondary | ICD-10-CM | POA: Diagnosis not present

## 2017-07-11 DIAGNOSIS — E119 Type 2 diabetes mellitus without complications: Secondary | ICD-10-CM | POA: Diagnosis not present

## 2017-07-11 DIAGNOSIS — K7290 Hepatic failure, unspecified without coma: Secondary | ICD-10-CM | POA: Diagnosis not present

## 2017-07-11 DIAGNOSIS — I5033 Acute on chronic diastolic (congestive) heart failure: Secondary | ICD-10-CM | POA: Diagnosis not present

## 2017-07-12 DIAGNOSIS — E119 Type 2 diabetes mellitus without complications: Secondary | ICD-10-CM | POA: Diagnosis not present

## 2017-07-12 DIAGNOSIS — K7581 Nonalcoholic steatohepatitis (NASH): Secondary | ICD-10-CM | POA: Diagnosis not present

## 2017-07-12 DIAGNOSIS — I110 Hypertensive heart disease with heart failure: Secondary | ICD-10-CM | POA: Diagnosis not present

## 2017-07-12 DIAGNOSIS — K7290 Hepatic failure, unspecified without coma: Secondary | ICD-10-CM | POA: Diagnosis not present

## 2017-07-12 DIAGNOSIS — I5033 Acute on chronic diastolic (congestive) heart failure: Secondary | ICD-10-CM | POA: Diagnosis not present

## 2017-07-12 DIAGNOSIS — K7460 Unspecified cirrhosis of liver: Secondary | ICD-10-CM | POA: Diagnosis not present

## 2017-07-13 DIAGNOSIS — K7290 Hepatic failure, unspecified without coma: Secondary | ICD-10-CM | POA: Diagnosis not present

## 2017-07-13 DIAGNOSIS — I110 Hypertensive heart disease with heart failure: Secondary | ICD-10-CM | POA: Diagnosis not present

## 2017-07-13 DIAGNOSIS — I5033 Acute on chronic diastolic (congestive) heart failure: Secondary | ICD-10-CM | POA: Diagnosis not present

## 2017-07-13 DIAGNOSIS — K7581 Nonalcoholic steatohepatitis (NASH): Secondary | ICD-10-CM | POA: Diagnosis not present

## 2017-07-13 DIAGNOSIS — E119 Type 2 diabetes mellitus without complications: Secondary | ICD-10-CM | POA: Diagnosis not present

## 2017-07-13 DIAGNOSIS — K7460 Unspecified cirrhosis of liver: Secondary | ICD-10-CM | POA: Diagnosis not present

## 2017-07-14 DIAGNOSIS — I5033 Acute on chronic diastolic (congestive) heart failure: Secondary | ICD-10-CM | POA: Diagnosis not present

## 2017-07-14 DIAGNOSIS — K7290 Hepatic failure, unspecified without coma: Secondary | ICD-10-CM | POA: Diagnosis not present

## 2017-07-14 DIAGNOSIS — K7581 Nonalcoholic steatohepatitis (NASH): Secondary | ICD-10-CM | POA: Diagnosis not present

## 2017-07-14 DIAGNOSIS — E119 Type 2 diabetes mellitus without complications: Secondary | ICD-10-CM | POA: Diagnosis not present

## 2017-07-14 DIAGNOSIS — K7460 Unspecified cirrhosis of liver: Secondary | ICD-10-CM | POA: Diagnosis not present

## 2017-07-14 DIAGNOSIS — I110 Hypertensive heart disease with heart failure: Secondary | ICD-10-CM | POA: Diagnosis not present

## 2017-07-15 DIAGNOSIS — K7290 Hepatic failure, unspecified without coma: Secondary | ICD-10-CM | POA: Diagnosis not present

## 2017-07-15 DIAGNOSIS — K7581 Nonalcoholic steatohepatitis (NASH): Secondary | ICD-10-CM | POA: Diagnosis not present

## 2017-07-15 DIAGNOSIS — I110 Hypertensive heart disease with heart failure: Secondary | ICD-10-CM | POA: Diagnosis not present

## 2017-07-15 DIAGNOSIS — E119 Type 2 diabetes mellitus without complications: Secondary | ICD-10-CM | POA: Diagnosis not present

## 2017-07-15 DIAGNOSIS — I5033 Acute on chronic diastolic (congestive) heart failure: Secondary | ICD-10-CM | POA: Diagnosis not present

## 2017-07-15 DIAGNOSIS — K7460 Unspecified cirrhosis of liver: Secondary | ICD-10-CM | POA: Diagnosis not present

## 2017-07-16 DIAGNOSIS — I482 Chronic atrial fibrillation: Secondary | ICD-10-CM | POA: Diagnosis not present

## 2017-07-16 DIAGNOSIS — I5030 Unspecified diastolic (congestive) heart failure: Secondary | ICD-10-CM | POA: Diagnosis not present

## 2017-07-16 DIAGNOSIS — K7460 Unspecified cirrhosis of liver: Secondary | ICD-10-CM | POA: Diagnosis not present

## 2017-07-16 DIAGNOSIS — I10 Essential (primary) hypertension: Secondary | ICD-10-CM | POA: Diagnosis not present

## 2017-07-16 DIAGNOSIS — E1165 Type 2 diabetes mellitus with hyperglycemia: Secondary | ICD-10-CM | POA: Diagnosis not present

## 2017-07-16 DIAGNOSIS — Z6837 Body mass index (BMI) 37.0-37.9, adult: Secondary | ICD-10-CM | POA: Diagnosis not present

## 2017-07-16 DIAGNOSIS — E782 Mixed hyperlipidemia: Secondary | ICD-10-CM | POA: Diagnosis not present

## 2017-07-16 DIAGNOSIS — C220 Liver cell carcinoma: Secondary | ICD-10-CM | POA: Diagnosis not present

## 2017-07-17 DIAGNOSIS — K7581 Nonalcoholic steatohepatitis (NASH): Secondary | ICD-10-CM | POA: Diagnosis not present

## 2017-07-17 DIAGNOSIS — E119 Type 2 diabetes mellitus without complications: Secondary | ICD-10-CM | POA: Diagnosis not present

## 2017-07-17 DIAGNOSIS — I5033 Acute on chronic diastolic (congestive) heart failure: Secondary | ICD-10-CM | POA: Diagnosis not present

## 2017-07-17 DIAGNOSIS — K7460 Unspecified cirrhosis of liver: Secondary | ICD-10-CM | POA: Diagnosis not present

## 2017-07-17 DIAGNOSIS — K7290 Hepatic failure, unspecified without coma: Secondary | ICD-10-CM | POA: Diagnosis not present

## 2017-07-17 DIAGNOSIS — I110 Hypertensive heart disease with heart failure: Secondary | ICD-10-CM | POA: Diagnosis not present

## 2017-07-18 DIAGNOSIS — K7290 Hepatic failure, unspecified without coma: Secondary | ICD-10-CM | POA: Diagnosis not present

## 2017-07-18 DIAGNOSIS — E119 Type 2 diabetes mellitus without complications: Secondary | ICD-10-CM | POA: Diagnosis not present

## 2017-07-18 DIAGNOSIS — I5033 Acute on chronic diastolic (congestive) heart failure: Secondary | ICD-10-CM | POA: Diagnosis not present

## 2017-07-18 DIAGNOSIS — K7581 Nonalcoholic steatohepatitis (NASH): Secondary | ICD-10-CM | POA: Diagnosis not present

## 2017-07-18 DIAGNOSIS — I110 Hypertensive heart disease with heart failure: Secondary | ICD-10-CM | POA: Diagnosis not present

## 2017-07-18 DIAGNOSIS — K7460 Unspecified cirrhosis of liver: Secondary | ICD-10-CM | POA: Diagnosis not present

## 2017-07-19 DIAGNOSIS — K7460 Unspecified cirrhosis of liver: Secondary | ICD-10-CM | POA: Diagnosis not present

## 2017-07-19 DIAGNOSIS — E119 Type 2 diabetes mellitus without complications: Secondary | ICD-10-CM | POA: Diagnosis not present

## 2017-07-19 DIAGNOSIS — K7290 Hepatic failure, unspecified without coma: Secondary | ICD-10-CM | POA: Diagnosis not present

## 2017-07-19 DIAGNOSIS — I5033 Acute on chronic diastolic (congestive) heart failure: Secondary | ICD-10-CM | POA: Diagnosis not present

## 2017-07-19 DIAGNOSIS — I110 Hypertensive heart disease with heart failure: Secondary | ICD-10-CM | POA: Diagnosis not present

## 2017-07-19 DIAGNOSIS — K7581 Nonalcoholic steatohepatitis (NASH): Secondary | ICD-10-CM | POA: Diagnosis not present

## 2017-07-20 DIAGNOSIS — K7460 Unspecified cirrhosis of liver: Secondary | ICD-10-CM | POA: Diagnosis not present

## 2017-07-20 DIAGNOSIS — I110 Hypertensive heart disease with heart failure: Secondary | ICD-10-CM | POA: Diagnosis not present

## 2017-07-20 DIAGNOSIS — E119 Type 2 diabetes mellitus without complications: Secondary | ICD-10-CM | POA: Diagnosis not present

## 2017-07-20 DIAGNOSIS — K7581 Nonalcoholic steatohepatitis (NASH): Secondary | ICD-10-CM | POA: Diagnosis not present

## 2017-07-20 DIAGNOSIS — I5033 Acute on chronic diastolic (congestive) heart failure: Secondary | ICD-10-CM | POA: Diagnosis not present

## 2017-07-20 DIAGNOSIS — K7290 Hepatic failure, unspecified without coma: Secondary | ICD-10-CM | POA: Diagnosis not present

## 2017-07-24 DIAGNOSIS — I5033 Acute on chronic diastolic (congestive) heart failure: Secondary | ICD-10-CM | POA: Diagnosis not present

## 2017-07-24 DIAGNOSIS — K7581 Nonalcoholic steatohepatitis (NASH): Secondary | ICD-10-CM | POA: Diagnosis not present

## 2017-07-24 DIAGNOSIS — K7290 Hepatic failure, unspecified without coma: Secondary | ICD-10-CM | POA: Diagnosis not present

## 2017-07-24 DIAGNOSIS — I110 Hypertensive heart disease with heart failure: Secondary | ICD-10-CM | POA: Diagnosis not present

## 2017-07-24 DIAGNOSIS — E119 Type 2 diabetes mellitus without complications: Secondary | ICD-10-CM | POA: Diagnosis not present

## 2017-07-24 DIAGNOSIS — K7460 Unspecified cirrhosis of liver: Secondary | ICD-10-CM | POA: Diagnosis not present

## 2017-07-27 ENCOUNTER — Other Ambulatory Visit (HOSPITAL_COMMUNITY): Payer: Self-pay | Admitting: Interventional Radiology

## 2017-07-27 DIAGNOSIS — D649 Anemia, unspecified: Secondary | ICD-10-CM | POA: Diagnosis not present

## 2017-07-27 DIAGNOSIS — E782 Mixed hyperlipidemia: Secondary | ICD-10-CM | POA: Diagnosis not present

## 2017-07-27 DIAGNOSIS — I5033 Acute on chronic diastolic (congestive) heart failure: Secondary | ICD-10-CM | POA: Diagnosis not present

## 2017-07-27 DIAGNOSIS — E119 Type 2 diabetes mellitus without complications: Secondary | ICD-10-CM | POA: Diagnosis not present

## 2017-07-27 DIAGNOSIS — F419 Anxiety disorder, unspecified: Secondary | ICD-10-CM | POA: Diagnosis not present

## 2017-07-27 DIAGNOSIS — D696 Thrombocytopenia, unspecified: Secondary | ICD-10-CM | POA: Diagnosis not present

## 2017-07-27 DIAGNOSIS — C220 Liver cell carcinoma: Secondary | ICD-10-CM

## 2017-07-27 DIAGNOSIS — K7581 Nonalcoholic steatohepatitis (NASH): Secondary | ICD-10-CM | POA: Diagnosis not present

## 2017-07-27 DIAGNOSIS — K7290 Hepatic failure, unspecified without coma: Secondary | ICD-10-CM | POA: Diagnosis not present

## 2017-07-27 DIAGNOSIS — I110 Hypertensive heart disease with heart failure: Secondary | ICD-10-CM | POA: Diagnosis not present

## 2017-07-27 DIAGNOSIS — E039 Hypothyroidism, unspecified: Secondary | ICD-10-CM | POA: Diagnosis not present

## 2017-07-27 DIAGNOSIS — E1165 Type 2 diabetes mellitus with hyperglycemia: Secondary | ICD-10-CM | POA: Diagnosis not present

## 2017-07-27 DIAGNOSIS — K7460 Unspecified cirrhosis of liver: Secondary | ICD-10-CM | POA: Diagnosis not present

## 2017-07-27 DIAGNOSIS — I10 Essential (primary) hypertension: Secondary | ICD-10-CM | POA: Diagnosis not present

## 2017-07-27 DIAGNOSIS — K219 Gastro-esophageal reflux disease without esophagitis: Secondary | ICD-10-CM | POA: Diagnosis not present

## 2017-07-31 ENCOUNTER — Encounter: Payer: Self-pay | Admitting: Cardiology

## 2017-07-31 DIAGNOSIS — K219 Gastro-esophageal reflux disease without esophagitis: Secondary | ICD-10-CM | POA: Diagnosis not present

## 2017-07-31 DIAGNOSIS — N189 Chronic kidney disease, unspecified: Secondary | ICD-10-CM | POA: Diagnosis not present

## 2017-07-31 DIAGNOSIS — K7460 Unspecified cirrhosis of liver: Secondary | ICD-10-CM | POA: Diagnosis not present

## 2017-07-31 DIAGNOSIS — D696 Thrombocytopenia, unspecified: Secondary | ICD-10-CM | POA: Diagnosis not present

## 2017-07-31 DIAGNOSIS — I10 Essential (primary) hypertension: Secondary | ICD-10-CM | POA: Diagnosis not present

## 2017-07-31 DIAGNOSIS — E782 Mixed hyperlipidemia: Secondary | ICD-10-CM | POA: Diagnosis not present

## 2017-07-31 DIAGNOSIS — F419 Anxiety disorder, unspecified: Secondary | ICD-10-CM | POA: Diagnosis not present

## 2017-07-31 DIAGNOSIS — Z6841 Body Mass Index (BMI) 40.0 and over, adult: Secondary | ICD-10-CM | POA: Diagnosis not present

## 2017-07-31 DIAGNOSIS — Z0001 Encounter for general adult medical examination with abnormal findings: Secondary | ICD-10-CM | POA: Diagnosis not present

## 2017-07-31 DIAGNOSIS — E1165 Type 2 diabetes mellitus with hyperglycemia: Secondary | ICD-10-CM | POA: Diagnosis not present

## 2017-07-31 DIAGNOSIS — I5030 Unspecified diastolic (congestive) heart failure: Secondary | ICD-10-CM | POA: Diagnosis not present

## 2017-07-31 DIAGNOSIS — E039 Hypothyroidism, unspecified: Secondary | ICD-10-CM | POA: Diagnosis not present

## 2017-08-03 ENCOUNTER — Telehealth: Payer: Self-pay | Admitting: *Deleted

## 2017-08-03 DIAGNOSIS — I5033 Acute on chronic diastolic (congestive) heart failure: Secondary | ICD-10-CM | POA: Diagnosis not present

## 2017-08-03 DIAGNOSIS — E119 Type 2 diabetes mellitus without complications: Secondary | ICD-10-CM | POA: Diagnosis not present

## 2017-08-03 DIAGNOSIS — K7290 Hepatic failure, unspecified without coma: Secondary | ICD-10-CM | POA: Diagnosis not present

## 2017-08-03 DIAGNOSIS — K7460 Unspecified cirrhosis of liver: Secondary | ICD-10-CM | POA: Diagnosis not present

## 2017-08-03 DIAGNOSIS — I110 Hypertensive heart disease with heart failure: Secondary | ICD-10-CM | POA: Diagnosis not present

## 2017-08-03 DIAGNOSIS — K7581 Nonalcoholic steatohepatitis (NASH): Secondary | ICD-10-CM | POA: Diagnosis not present

## 2017-08-03 NOTE — Telephone Encounter (Signed)
-----   Message from Arnoldo Lenis, MD sent at 08/03/2017 10:51 AM EDT ----- Let patient know we receieved recent blood work from her pcp, has had mild decrease in her kidney function. From her chart she is on much strong fluid pills current. Verify she is on lasix 36m tid, if show change to 857mAM, 8049mn afternoon, 80m49m PM. If she has weight gain of 3 pounds needs to go back to 80mg48m until weight is back down. Please forward this message to [facility] as well KatieCarola FrostcH MD

## 2017-08-04 DIAGNOSIS — I482 Chronic atrial fibrillation: Secondary | ICD-10-CM | POA: Diagnosis not present

## 2017-08-04 DIAGNOSIS — J449 Chronic obstructive pulmonary disease, unspecified: Secondary | ICD-10-CM | POA: Diagnosis not present

## 2017-08-04 DIAGNOSIS — E119 Type 2 diabetes mellitus without complications: Secondary | ICD-10-CM | POA: Diagnosis not present

## 2017-08-04 DIAGNOSIS — K7581 Nonalcoholic steatohepatitis (NASH): Secondary | ICD-10-CM | POA: Diagnosis not present

## 2017-08-04 DIAGNOSIS — I5033 Acute on chronic diastolic (congestive) heart failure: Secondary | ICD-10-CM | POA: Diagnosis not present

## 2017-08-04 DIAGNOSIS — I110 Hypertensive heart disease with heart failure: Secondary | ICD-10-CM | POA: Diagnosis not present

## 2017-08-04 DIAGNOSIS — K7290 Hepatic failure, unspecified without coma: Secondary | ICD-10-CM | POA: Diagnosis not present

## 2017-08-04 DIAGNOSIS — K7460 Unspecified cirrhosis of liver: Secondary | ICD-10-CM | POA: Diagnosis not present

## 2017-08-07 DIAGNOSIS — R4182 Altered mental status, unspecified: Secondary | ICD-10-CM | POA: Diagnosis not present

## 2017-08-07 NOTE — Telephone Encounter (Signed)
Home number busy, no answer mobile number.

## 2017-08-08 ENCOUNTER — Telehealth (INDEPENDENT_AMBULATORY_CARE_PROVIDER_SITE_OTHER): Payer: Self-pay | Admitting: *Deleted

## 2017-08-08 NOTE — Telephone Encounter (Signed)
Patient presented to their practice for a check up. Her niece , Cheryl Velasquez ask that they draw ammonia level as the patient was not acting right.  Ammonia came back at 64.8 . Cheryl Velasquez called our office and ask what would the recommendation be for the patient.  Per Dr.Rehman the patient may start Lactulose 20 grams  By mouth twice a day, repeat the ammonia level in 2 weeks.  Cheryl Velasquez was called and given his recommendation and she will states that she will handle this. They will let us know the result of the lab in 2 weeks , at which time we will address if the patient is to continue at the current dose or titrate the dose.

## 2017-08-11 DIAGNOSIS — I110 Hypertensive heart disease with heart failure: Secondary | ICD-10-CM | POA: Diagnosis not present

## 2017-08-11 DIAGNOSIS — K7290 Hepatic failure, unspecified without coma: Secondary | ICD-10-CM | POA: Diagnosis not present

## 2017-08-11 DIAGNOSIS — E119 Type 2 diabetes mellitus without complications: Secondary | ICD-10-CM | POA: Diagnosis not present

## 2017-08-11 DIAGNOSIS — K7460 Unspecified cirrhosis of liver: Secondary | ICD-10-CM | POA: Diagnosis not present

## 2017-08-11 DIAGNOSIS — K7581 Nonalcoholic steatohepatitis (NASH): Secondary | ICD-10-CM | POA: Diagnosis not present

## 2017-08-11 DIAGNOSIS — I5033 Acute on chronic diastolic (congestive) heart failure: Secondary | ICD-10-CM | POA: Diagnosis not present

## 2017-08-14 MED ORDER — FUROSEMIDE 80 MG PO TABS: ORAL_TABLET | ORAL | Status: DC

## 2017-08-14 NOTE — Telephone Encounter (Signed)
Patient notified of below.  Stated that she was only taking Lasix 62m BID, not TID.  Please advise if still want to make the same change as below.    She asked that we call her niece (Alfonse Spruce  [phone] to give her these instructions as well.

## 2017-08-15 DIAGNOSIS — K7581 Nonalcoholic steatohepatitis (NASH): Secondary | ICD-10-CM | POA: Diagnosis not present

## 2017-08-15 DIAGNOSIS — I5033 Acute on chronic diastolic (congestive) heart failure: Secondary | ICD-10-CM | POA: Diagnosis not present

## 2017-08-15 DIAGNOSIS — E119 Type 2 diabetes mellitus without complications: Secondary | ICD-10-CM | POA: Diagnosis not present

## 2017-08-15 DIAGNOSIS — I110 Hypertensive heart disease with heart failure: Secondary | ICD-10-CM | POA: Diagnosis not present

## 2017-08-15 DIAGNOSIS — K7460 Unspecified cirrhosis of liver: Secondary | ICD-10-CM | POA: Diagnosis not present

## 2017-08-15 DIAGNOSIS — K7290 Hepatic failure, unspecified without coma: Secondary | ICD-10-CM | POA: Diagnosis not present

## 2017-08-15 NOTE — Telephone Encounter (Signed)
Yes, if taking lasix 74m bid then lower to 823min AM and 4021mn PM    J BZandra Abts

## 2017-08-15 NOTE — Telephone Encounter (Signed)
Jeri Lager (niece) called in regards to a phone call made to patient 08/14/17 concerning her medications. [phone]

## 2017-08-16 DIAGNOSIS — J449 Chronic obstructive pulmonary disease, unspecified: Secondary | ICD-10-CM | POA: Diagnosis not present

## 2017-08-16 DIAGNOSIS — I482 Chronic atrial fibrillation: Secondary | ICD-10-CM | POA: Diagnosis not present

## 2017-08-16 DIAGNOSIS — G4700 Insomnia, unspecified: Secondary | ICD-10-CM | POA: Diagnosis not present

## 2017-08-16 DIAGNOSIS — I110 Hypertensive heart disease with heart failure: Secondary | ICD-10-CM | POA: Diagnosis not present

## 2017-08-16 DIAGNOSIS — E119 Type 2 diabetes mellitus without complications: Secondary | ICD-10-CM | POA: Diagnosis not present

## 2017-08-16 DIAGNOSIS — M6281 Muscle weakness (generalized): Secondary | ICD-10-CM | POA: Diagnosis not present

## 2017-08-16 DIAGNOSIS — E039 Hypothyroidism, unspecified: Secondary | ICD-10-CM | POA: Diagnosis not present

## 2017-08-16 DIAGNOSIS — I5033 Acute on chronic diastolic (congestive) heart failure: Secondary | ICD-10-CM | POA: Diagnosis not present

## 2017-08-16 DIAGNOSIS — K7460 Unspecified cirrhosis of liver: Secondary | ICD-10-CM | POA: Diagnosis not present

## 2017-08-16 DIAGNOSIS — E785 Hyperlipidemia, unspecified: Secondary | ICD-10-CM | POA: Diagnosis not present

## 2017-08-16 DIAGNOSIS — C220 Liver cell carcinoma: Secondary | ICD-10-CM | POA: Diagnosis not present

## 2017-08-16 DIAGNOSIS — K7290 Hepatic failure, unspecified without coma: Secondary | ICD-10-CM | POA: Diagnosis not present

## 2017-08-16 DIAGNOSIS — M1990 Unspecified osteoarthritis, unspecified site: Secondary | ICD-10-CM | POA: Diagnosis not present

## 2017-08-16 DIAGNOSIS — Z7984 Long term (current) use of oral hypoglycemic drugs: Secondary | ICD-10-CM | POA: Diagnosis not present

## 2017-08-16 DIAGNOSIS — K219 Gastro-esophageal reflux disease without esophagitis: Secondary | ICD-10-CM | POA: Diagnosis not present

## 2017-08-16 DIAGNOSIS — K7581 Nonalcoholic steatohepatitis (NASH): Secondary | ICD-10-CM | POA: Diagnosis not present

## 2017-08-16 NOTE — Telephone Encounter (Signed)
Niece Jeri Lager) notified this morning.

## 2017-08-16 NOTE — Telephone Encounter (Signed)
Left message to return call with niece.

## 2017-08-21 DIAGNOSIS — I5033 Acute on chronic diastolic (congestive) heart failure: Secondary | ICD-10-CM | POA: Diagnosis not present

## 2017-08-21 DIAGNOSIS — K7290 Hepatic failure, unspecified without coma: Secondary | ICD-10-CM | POA: Diagnosis not present

## 2017-08-21 DIAGNOSIS — K7581 Nonalcoholic steatohepatitis (NASH): Secondary | ICD-10-CM | POA: Diagnosis not present

## 2017-08-21 DIAGNOSIS — I110 Hypertensive heart disease with heart failure: Secondary | ICD-10-CM | POA: Diagnosis not present

## 2017-08-21 DIAGNOSIS — K7460 Unspecified cirrhosis of liver: Secondary | ICD-10-CM | POA: Diagnosis not present

## 2017-08-21 DIAGNOSIS — E119 Type 2 diabetes mellitus without complications: Secondary | ICD-10-CM | POA: Diagnosis not present

## 2017-08-23 DIAGNOSIS — I5033 Acute on chronic diastolic (congestive) heart failure: Secondary | ICD-10-CM | POA: Diagnosis not present

## 2017-08-23 DIAGNOSIS — E119 Type 2 diabetes mellitus without complications: Secondary | ICD-10-CM | POA: Diagnosis not present

## 2017-08-23 DIAGNOSIS — K7581 Nonalcoholic steatohepatitis (NASH): Secondary | ICD-10-CM | POA: Diagnosis not present

## 2017-08-23 DIAGNOSIS — K7290 Hepatic failure, unspecified without coma: Secondary | ICD-10-CM | POA: Diagnosis not present

## 2017-08-23 DIAGNOSIS — I110 Hypertensive heart disease with heart failure: Secondary | ICD-10-CM | POA: Diagnosis not present

## 2017-08-23 DIAGNOSIS — K7460 Unspecified cirrhosis of liver: Secondary | ICD-10-CM | POA: Diagnosis not present

## 2017-08-24 DIAGNOSIS — K7460 Unspecified cirrhosis of liver: Secondary | ICD-10-CM | POA: Diagnosis not present

## 2017-08-24 DIAGNOSIS — K7290 Hepatic failure, unspecified without coma: Secondary | ICD-10-CM | POA: Diagnosis not present

## 2017-08-24 DIAGNOSIS — I110 Hypertensive heart disease with heart failure: Secondary | ICD-10-CM | POA: Diagnosis not present

## 2017-08-24 DIAGNOSIS — E119 Type 2 diabetes mellitus without complications: Secondary | ICD-10-CM | POA: Diagnosis not present

## 2017-08-24 DIAGNOSIS — K7581 Nonalcoholic steatohepatitis (NASH): Secondary | ICD-10-CM | POA: Diagnosis not present

## 2017-08-24 DIAGNOSIS — I5033 Acute on chronic diastolic (congestive) heart failure: Secondary | ICD-10-CM | POA: Diagnosis not present

## 2017-08-25 DIAGNOSIS — N189 Chronic kidney disease, unspecified: Secondary | ICD-10-CM | POA: Diagnosis not present

## 2017-08-25 DIAGNOSIS — E7220 Disorder of urea cycle metabolism, unspecified: Secondary | ICD-10-CM | POA: Diagnosis not present

## 2017-08-25 DIAGNOSIS — Z6834 Body mass index (BMI) 34.0-34.9, adult: Secondary | ICD-10-CM | POA: Diagnosis not present

## 2017-08-25 DIAGNOSIS — D696 Thrombocytopenia, unspecified: Secondary | ICD-10-CM | POA: Diagnosis not present

## 2017-08-25 DIAGNOSIS — K7460 Unspecified cirrhosis of liver: Secondary | ICD-10-CM | POA: Diagnosis not present

## 2017-08-25 DIAGNOSIS — F419 Anxiety disorder, unspecified: Secondary | ICD-10-CM | POA: Diagnosis not present

## 2017-08-25 DIAGNOSIS — I5030 Unspecified diastolic (congestive) heart failure: Secondary | ICD-10-CM | POA: Diagnosis not present

## 2017-08-28 DIAGNOSIS — I110 Hypertensive heart disease with heart failure: Secondary | ICD-10-CM | POA: Diagnosis not present

## 2017-08-28 DIAGNOSIS — K7460 Unspecified cirrhosis of liver: Secondary | ICD-10-CM | POA: Diagnosis not present

## 2017-08-28 DIAGNOSIS — I5033 Acute on chronic diastolic (congestive) heart failure: Secondary | ICD-10-CM | POA: Diagnosis not present

## 2017-08-28 DIAGNOSIS — K7581 Nonalcoholic steatohepatitis (NASH): Secondary | ICD-10-CM | POA: Diagnosis not present

## 2017-08-28 DIAGNOSIS — E119 Type 2 diabetes mellitus without complications: Secondary | ICD-10-CM | POA: Diagnosis not present

## 2017-08-28 DIAGNOSIS — K7290 Hepatic failure, unspecified without coma: Secondary | ICD-10-CM | POA: Diagnosis not present

## 2017-08-30 DIAGNOSIS — I482 Chronic atrial fibrillation: Secondary | ICD-10-CM | POA: Diagnosis not present

## 2017-08-30 DIAGNOSIS — K7290 Hepatic failure, unspecified without coma: Secondary | ICD-10-CM | POA: Diagnosis not present

## 2017-08-30 DIAGNOSIS — I110 Hypertensive heart disease with heart failure: Secondary | ICD-10-CM | POA: Diagnosis not present

## 2017-08-30 DIAGNOSIS — K7581 Nonalcoholic steatohepatitis (NASH): Secondary | ICD-10-CM | POA: Diagnosis not present

## 2017-08-30 DIAGNOSIS — J449 Chronic obstructive pulmonary disease, unspecified: Secondary | ICD-10-CM | POA: Diagnosis not present

## 2017-08-30 DIAGNOSIS — I5033 Acute on chronic diastolic (congestive) heart failure: Secondary | ICD-10-CM | POA: Diagnosis not present

## 2017-08-30 DIAGNOSIS — K7460 Unspecified cirrhosis of liver: Secondary | ICD-10-CM | POA: Diagnosis not present

## 2017-08-30 DIAGNOSIS — E119 Type 2 diabetes mellitus without complications: Secondary | ICD-10-CM | POA: Diagnosis not present

## 2017-08-31 DIAGNOSIS — K7460 Unspecified cirrhosis of liver: Secondary | ICD-10-CM | POA: Diagnosis not present

## 2017-08-31 DIAGNOSIS — K7581 Nonalcoholic steatohepatitis (NASH): Secondary | ICD-10-CM | POA: Diagnosis not present

## 2017-08-31 DIAGNOSIS — E119 Type 2 diabetes mellitus without complications: Secondary | ICD-10-CM | POA: Diagnosis not present

## 2017-08-31 DIAGNOSIS — K7290 Hepatic failure, unspecified without coma: Secondary | ICD-10-CM | POA: Diagnosis not present

## 2017-08-31 DIAGNOSIS — I110 Hypertensive heart disease with heart failure: Secondary | ICD-10-CM | POA: Diagnosis not present

## 2017-08-31 DIAGNOSIS — I5033 Acute on chronic diastolic (congestive) heart failure: Secondary | ICD-10-CM | POA: Diagnosis not present

## 2017-08-31 DIAGNOSIS — I509 Heart failure, unspecified: Secondary | ICD-10-CM | POA: Diagnosis not present

## 2017-09-04 DIAGNOSIS — E7220 Disorder of urea cycle metabolism, unspecified: Secondary | ICD-10-CM | POA: Diagnosis not present

## 2017-09-04 DIAGNOSIS — Z1331 Encounter for screening for depression: Secondary | ICD-10-CM | POA: Diagnosis not present

## 2017-09-04 DIAGNOSIS — Z6834 Body mass index (BMI) 34.0-34.9, adult: Secondary | ICD-10-CM | POA: Diagnosis not present

## 2017-09-04 DIAGNOSIS — E119 Type 2 diabetes mellitus without complications: Secondary | ICD-10-CM | POA: Diagnosis not present

## 2017-09-04 DIAGNOSIS — K7290 Hepatic failure, unspecified without coma: Secondary | ICD-10-CM | POA: Diagnosis not present

## 2017-09-04 DIAGNOSIS — I5033 Acute on chronic diastolic (congestive) heart failure: Secondary | ICD-10-CM | POA: Diagnosis not present

## 2017-09-04 DIAGNOSIS — N189 Chronic kidney disease, unspecified: Secondary | ICD-10-CM | POA: Diagnosis not present

## 2017-09-04 DIAGNOSIS — Z1389 Encounter for screening for other disorder: Secondary | ICD-10-CM | POA: Diagnosis not present

## 2017-09-04 DIAGNOSIS — D696 Thrombocytopenia, unspecified: Secondary | ICD-10-CM | POA: Diagnosis not present

## 2017-09-04 DIAGNOSIS — K7460 Unspecified cirrhosis of liver: Secondary | ICD-10-CM | POA: Diagnosis not present

## 2017-09-04 DIAGNOSIS — K7581 Nonalcoholic steatohepatitis (NASH): Secondary | ICD-10-CM | POA: Diagnosis not present

## 2017-09-04 DIAGNOSIS — I110 Hypertensive heart disease with heart failure: Secondary | ICD-10-CM | POA: Diagnosis not present

## 2017-09-04 DIAGNOSIS — I5030 Unspecified diastolic (congestive) heart failure: Secondary | ICD-10-CM | POA: Diagnosis not present

## 2017-09-04 DIAGNOSIS — F419 Anxiety disorder, unspecified: Secondary | ICD-10-CM | POA: Diagnosis not present

## 2017-09-05 DIAGNOSIS — E119 Type 2 diabetes mellitus without complications: Secondary | ICD-10-CM | POA: Diagnosis not present

## 2017-09-05 DIAGNOSIS — K7581 Nonalcoholic steatohepatitis (NASH): Secondary | ICD-10-CM | POA: Diagnosis not present

## 2017-09-05 DIAGNOSIS — K7290 Hepatic failure, unspecified without coma: Secondary | ICD-10-CM | POA: Diagnosis not present

## 2017-09-05 DIAGNOSIS — I5033 Acute on chronic diastolic (congestive) heart failure: Secondary | ICD-10-CM | POA: Diagnosis not present

## 2017-09-05 DIAGNOSIS — I110 Hypertensive heart disease with heart failure: Secondary | ICD-10-CM | POA: Diagnosis not present

## 2017-09-05 DIAGNOSIS — K7460 Unspecified cirrhosis of liver: Secondary | ICD-10-CM | POA: Diagnosis not present

## 2017-09-07 DIAGNOSIS — I110 Hypertensive heart disease with heart failure: Secondary | ICD-10-CM | POA: Diagnosis not present

## 2017-09-07 DIAGNOSIS — E119 Type 2 diabetes mellitus without complications: Secondary | ICD-10-CM | POA: Diagnosis not present

## 2017-09-07 DIAGNOSIS — K7460 Unspecified cirrhosis of liver: Secondary | ICD-10-CM | POA: Diagnosis not present

## 2017-09-07 DIAGNOSIS — I5033 Acute on chronic diastolic (congestive) heart failure: Secondary | ICD-10-CM | POA: Diagnosis not present

## 2017-09-07 DIAGNOSIS — K7290 Hepatic failure, unspecified without coma: Secondary | ICD-10-CM | POA: Diagnosis not present

## 2017-09-07 DIAGNOSIS — K7581 Nonalcoholic steatohepatitis (NASH): Secondary | ICD-10-CM | POA: Diagnosis not present

## 2017-09-11 DIAGNOSIS — E7220 Disorder of urea cycle metabolism, unspecified: Secondary | ICD-10-CM | POA: Diagnosis not present

## 2017-09-11 DIAGNOSIS — K7581 Nonalcoholic steatohepatitis (NASH): Secondary | ICD-10-CM | POA: Diagnosis not present

## 2017-09-11 DIAGNOSIS — E119 Type 2 diabetes mellitus without complications: Secondary | ICD-10-CM | POA: Diagnosis not present

## 2017-09-11 DIAGNOSIS — I110 Hypertensive heart disease with heart failure: Secondary | ICD-10-CM | POA: Diagnosis not present

## 2017-09-11 DIAGNOSIS — I5033 Acute on chronic diastolic (congestive) heart failure: Secondary | ICD-10-CM | POA: Diagnosis not present

## 2017-09-11 DIAGNOSIS — K7460 Unspecified cirrhosis of liver: Secondary | ICD-10-CM | POA: Diagnosis not present

## 2017-09-11 DIAGNOSIS — K7290 Hepatic failure, unspecified without coma: Secondary | ICD-10-CM | POA: Diagnosis not present

## 2017-09-12 DIAGNOSIS — E119 Type 2 diabetes mellitus without complications: Secondary | ICD-10-CM | POA: Diagnosis not present

## 2017-09-12 DIAGNOSIS — I5033 Acute on chronic diastolic (congestive) heart failure: Secondary | ICD-10-CM | POA: Diagnosis not present

## 2017-09-12 DIAGNOSIS — K7290 Hepatic failure, unspecified without coma: Secondary | ICD-10-CM | POA: Diagnosis not present

## 2017-09-12 DIAGNOSIS — I110 Hypertensive heart disease with heart failure: Secondary | ICD-10-CM | POA: Diagnosis not present

## 2017-09-12 DIAGNOSIS — K7581 Nonalcoholic steatohepatitis (NASH): Secondary | ICD-10-CM | POA: Diagnosis not present

## 2017-09-12 DIAGNOSIS — K7460 Unspecified cirrhosis of liver: Secondary | ICD-10-CM | POA: Diagnosis not present

## 2017-09-13 ENCOUNTER — Other Ambulatory Visit: Payer: Self-pay

## 2017-09-14 DIAGNOSIS — K7290 Hepatic failure, unspecified without coma: Secondary | ICD-10-CM | POA: Diagnosis not present

## 2017-09-14 DIAGNOSIS — K7581 Nonalcoholic steatohepatitis (NASH): Secondary | ICD-10-CM | POA: Diagnosis not present

## 2017-09-14 DIAGNOSIS — K7460 Unspecified cirrhosis of liver: Secondary | ICD-10-CM | POA: Diagnosis not present

## 2017-09-14 DIAGNOSIS — I110 Hypertensive heart disease with heart failure: Secondary | ICD-10-CM | POA: Diagnosis not present

## 2017-09-14 DIAGNOSIS — E119 Type 2 diabetes mellitus without complications: Secondary | ICD-10-CM | POA: Diagnosis not present

## 2017-09-14 DIAGNOSIS — I5033 Acute on chronic diastolic (congestive) heart failure: Secondary | ICD-10-CM | POA: Diagnosis not present

## 2017-09-19 ENCOUNTER — Telehealth: Payer: Self-pay | Admitting: Radiology

## 2017-09-19 NOTE — Telephone Encounter (Signed)
Cheryl Velasquez (Cheryl Velasquez niece) contacted our office 09/04/2017 at 11:25 am:  Cheryl Velasquez shared that Cheryl Velasquez has decided that she does not want to schedule an MR and that she does not want additional treatment.    Dr Kathlene Cote notified 09/19/2017 at 10:45 am.  Reece Levy, RN 09/19/2017 10:45 AM

## 2017-09-25 DIAGNOSIS — E1140 Type 2 diabetes mellitus with diabetic neuropathy, unspecified: Secondary | ICD-10-CM | POA: Diagnosis not present

## 2017-09-25 DIAGNOSIS — L600 Ingrowing nail: Secondary | ICD-10-CM | POA: Diagnosis not present

## 2017-09-25 DIAGNOSIS — E1151 Type 2 diabetes mellitus with diabetic peripheral angiopathy without gangrene: Secondary | ICD-10-CM | POA: Diagnosis not present

## 2017-09-25 DIAGNOSIS — L03031 Cellulitis of right toe: Secondary | ICD-10-CM | POA: Diagnosis not present

## 2017-09-29 DIAGNOSIS — K7460 Unspecified cirrhosis of liver: Secondary | ICD-10-CM | POA: Diagnosis not present

## 2017-09-29 DIAGNOSIS — I5033 Acute on chronic diastolic (congestive) heart failure: Secondary | ICD-10-CM | POA: Diagnosis not present

## 2017-09-29 DIAGNOSIS — E119 Type 2 diabetes mellitus without complications: Secondary | ICD-10-CM | POA: Diagnosis not present

## 2017-09-29 DIAGNOSIS — I110 Hypertensive heart disease with heart failure: Secondary | ICD-10-CM | POA: Diagnosis not present

## 2017-09-29 DIAGNOSIS — K7581 Nonalcoholic steatohepatitis (NASH): Secondary | ICD-10-CM | POA: Diagnosis not present

## 2017-09-29 DIAGNOSIS — K7290 Hepatic failure, unspecified without coma: Secondary | ICD-10-CM | POA: Diagnosis not present

## 2017-10-02 DIAGNOSIS — E7220 Disorder of urea cycle metabolism, unspecified: Secondary | ICD-10-CM | POA: Diagnosis not present

## 2017-10-02 DIAGNOSIS — N189 Chronic kidney disease, unspecified: Secondary | ICD-10-CM | POA: Diagnosis not present

## 2017-10-02 DIAGNOSIS — Z6835 Body mass index (BMI) 35.0-35.9, adult: Secondary | ICD-10-CM | POA: Diagnosis not present

## 2017-10-02 DIAGNOSIS — K7460 Unspecified cirrhosis of liver: Secondary | ICD-10-CM | POA: Diagnosis not present

## 2017-10-02 DIAGNOSIS — F419 Anxiety disorder, unspecified: Secondary | ICD-10-CM | POA: Diagnosis not present

## 2017-10-02 DIAGNOSIS — I5030 Unspecified diastolic (congestive) heart failure: Secondary | ICD-10-CM | POA: Diagnosis not present

## 2017-10-02 DIAGNOSIS — D696 Thrombocytopenia, unspecified: Secondary | ICD-10-CM | POA: Diagnosis not present

## 2017-10-04 DIAGNOSIS — N189 Chronic kidney disease, unspecified: Secondary | ICD-10-CM | POA: Diagnosis not present

## 2017-10-04 DIAGNOSIS — K7460 Unspecified cirrhosis of liver: Secondary | ICD-10-CM | POA: Diagnosis not present

## 2017-10-10 ENCOUNTER — Ambulatory Visit (INDEPENDENT_AMBULATORY_CARE_PROVIDER_SITE_OTHER): Payer: Medicare Other | Admitting: Internal Medicine

## 2017-10-10 DIAGNOSIS — S299XXA Unspecified injury of thorax, initial encounter: Secondary | ICD-10-CM | POA: Diagnosis not present

## 2017-10-10 DIAGNOSIS — R55 Syncope and collapse: Secondary | ICD-10-CM | POA: Diagnosis present

## 2017-10-10 DIAGNOSIS — I214 Non-ST elevation (NSTEMI) myocardial infarction: Secondary | ICD-10-CM | POA: Diagnosis not present

## 2017-10-10 DIAGNOSIS — M25552 Pain in left hip: Secondary | ICD-10-CM | POA: Diagnosis not present

## 2017-10-10 DIAGNOSIS — J9691 Respiratory failure, unspecified with hypoxia: Secondary | ICD-10-CM | POA: Diagnosis not present

## 2017-10-10 DIAGNOSIS — I4891 Unspecified atrial fibrillation: Secondary | ICD-10-CM | POA: Diagnosis present

## 2017-10-10 DIAGNOSIS — T1490XA Injury, unspecified, initial encounter: Secondary | ICD-10-CM | POA: Diagnosis not present

## 2017-10-10 DIAGNOSIS — Z7984 Long term (current) use of oral hypoglycemic drugs: Secondary | ICD-10-CM | POA: Diagnosis not present

## 2017-10-10 DIAGNOSIS — I129 Hypertensive chronic kidney disease with stage 1 through stage 4 chronic kidney disease, or unspecified chronic kidney disease: Secondary | ICD-10-CM | POA: Diagnosis present

## 2017-10-10 DIAGNOSIS — C228 Malignant neoplasm of liver, primary, unspecified as to type: Secondary | ICD-10-CM | POA: Diagnosis present

## 2017-10-10 DIAGNOSIS — S199XXA Unspecified injury of neck, initial encounter: Secondary | ICD-10-CM | POA: Diagnosis not present

## 2017-10-10 DIAGNOSIS — Z853 Personal history of malignant neoplasm of breast: Secondary | ICD-10-CM | POA: Diagnosis not present

## 2017-10-10 DIAGNOSIS — Z90710 Acquired absence of both cervix and uterus: Secondary | ICD-10-CM | POA: Diagnosis not present

## 2017-10-10 DIAGNOSIS — T68XXXA Hypothermia, initial encounter: Secondary | ICD-10-CM | POA: Diagnosis not present

## 2017-10-10 DIAGNOSIS — C220 Liver cell carcinoma: Secondary | ICD-10-CM | POA: Diagnosis not present

## 2017-10-10 DIAGNOSIS — E860 Dehydration: Secondary | ICD-10-CM | POA: Diagnosis not present

## 2017-10-10 DIAGNOSIS — M6281 Muscle weakness (generalized): Secondary | ICD-10-CM | POA: Diagnosis not present

## 2017-10-10 DIAGNOSIS — I509 Heart failure, unspecified: Secondary | ICD-10-CM | POA: Diagnosis not present

## 2017-10-10 DIAGNOSIS — R5381 Other malaise: Secondary | ICD-10-CM | POA: Diagnosis not present

## 2017-10-10 DIAGNOSIS — Z01810 Encounter for preprocedural cardiovascular examination: Secondary | ICD-10-CM | POA: Diagnosis not present

## 2017-10-10 DIAGNOSIS — R0902 Hypoxemia: Secondary | ICD-10-CM | POA: Diagnosis not present

## 2017-10-10 DIAGNOSIS — M545 Low back pain: Secondary | ICD-10-CM | POA: Diagnosis not present

## 2017-10-10 DIAGNOSIS — K7469 Other cirrhosis of liver: Secondary | ICD-10-CM | POA: Diagnosis not present

## 2017-10-10 DIAGNOSIS — R42 Dizziness and giddiness: Secondary | ICD-10-CM | POA: Diagnosis not present

## 2017-10-10 DIAGNOSIS — Z9889 Other specified postprocedural states: Secondary | ICD-10-CM | POA: Diagnosis not present

## 2017-10-10 DIAGNOSIS — K7460 Unspecified cirrhosis of liver: Secondary | ICD-10-CM | POA: Diagnosis present

## 2017-10-10 DIAGNOSIS — R9439 Abnormal result of other cardiovascular function study: Secondary | ICD-10-CM | POA: Diagnosis present

## 2017-10-10 DIAGNOSIS — R2689 Other abnormalities of gait and mobility: Secondary | ICD-10-CM | POA: Diagnosis not present

## 2017-10-10 DIAGNOSIS — W19XXXA Unspecified fall, initial encounter: Secondary | ICD-10-CM | POA: Diagnosis not present

## 2017-10-10 DIAGNOSIS — N390 Urinary tract infection, site not specified: Secondary | ICD-10-CM | POA: Diagnosis not present

## 2017-10-10 DIAGNOSIS — M25551 Pain in right hip: Secondary | ICD-10-CM | POA: Diagnosis not present

## 2017-10-10 DIAGNOSIS — F419 Anxiety disorder, unspecified: Secondary | ICD-10-CM | POA: Diagnosis not present

## 2017-10-10 DIAGNOSIS — R531 Weakness: Secondary | ICD-10-CM | POA: Diagnosis not present

## 2017-10-10 DIAGNOSIS — E1122 Type 2 diabetes mellitus with diabetic chronic kidney disease: Secondary | ICD-10-CM | POA: Diagnosis present

## 2017-10-10 DIAGNOSIS — K210 Gastro-esophageal reflux disease with esophagitis: Secondary | ICD-10-CM | POA: Diagnosis not present

## 2017-10-10 DIAGNOSIS — Z79899 Other long term (current) drug therapy: Secondary | ICD-10-CM | POA: Diagnosis not present

## 2017-10-10 DIAGNOSIS — N183 Chronic kidney disease, stage 3 (moderate): Secondary | ICD-10-CM | POA: Diagnosis present

## 2017-10-10 DIAGNOSIS — I10 Essential (primary) hypertension: Secondary | ICD-10-CM | POA: Diagnosis not present

## 2017-10-10 DIAGNOSIS — D6959 Other secondary thrombocytopenia: Secondary | ICD-10-CM | POA: Diagnosis present

## 2017-10-10 DIAGNOSIS — E1165 Type 2 diabetes mellitus with hyperglycemia: Secondary | ICD-10-CM | POA: Diagnosis present

## 2017-10-10 DIAGNOSIS — S0990XA Unspecified injury of head, initial encounter: Secondary | ICD-10-CM | POA: Diagnosis not present

## 2017-10-13 DIAGNOSIS — I214 Non-ST elevation (NSTEMI) myocardial infarction: Secondary | ICD-10-CM | POA: Diagnosis not present

## 2017-10-13 DIAGNOSIS — I4891 Unspecified atrial fibrillation: Secondary | ICD-10-CM | POA: Diagnosis not present

## 2017-10-13 DIAGNOSIS — E1165 Type 2 diabetes mellitus with hyperglycemia: Secondary | ICD-10-CM | POA: Diagnosis not present

## 2017-10-13 DIAGNOSIS — R2689 Other abnormalities of gait and mobility: Secondary | ICD-10-CM | POA: Diagnosis not present

## 2017-10-13 DIAGNOSIS — M6281 Muscle weakness (generalized): Secondary | ICD-10-CM | POA: Diagnosis not present

## 2017-10-13 DIAGNOSIS — I509 Heart failure, unspecified: Secondary | ICD-10-CM | POA: Diagnosis not present

## 2017-10-13 DIAGNOSIS — F419 Anxiety disorder, unspecified: Secondary | ICD-10-CM | POA: Diagnosis not present

## 2017-10-13 DIAGNOSIS — R55 Syncope and collapse: Secondary | ICD-10-CM | POA: Diagnosis not present

## 2017-10-13 DIAGNOSIS — C220 Liver cell carcinoma: Secondary | ICD-10-CM | POA: Diagnosis not present

## 2017-10-13 DIAGNOSIS — K7581 Nonalcoholic steatohepatitis (NASH): Secondary | ICD-10-CM | POA: Diagnosis not present

## 2017-10-13 DIAGNOSIS — K210 Gastro-esophageal reflux disease with esophagitis: Secondary | ICD-10-CM | POA: Diagnosis not present

## 2017-10-13 DIAGNOSIS — K219 Gastro-esophageal reflux disease without esophagitis: Secondary | ICD-10-CM | POA: Diagnosis not present

## 2017-10-13 DIAGNOSIS — N390 Urinary tract infection, site not specified: Secondary | ICD-10-CM | POA: Diagnosis not present

## 2017-10-13 DIAGNOSIS — I10 Essential (primary) hypertension: Secondary | ICD-10-CM | POA: Diagnosis not present

## 2017-10-18 DIAGNOSIS — I214 Non-ST elevation (NSTEMI) myocardial infarction: Secondary | ICD-10-CM | POA: Diagnosis not present

## 2017-10-18 DIAGNOSIS — N390 Urinary tract infection, site not specified: Secondary | ICD-10-CM | POA: Diagnosis not present

## 2017-10-18 DIAGNOSIS — K7581 Nonalcoholic steatohepatitis (NASH): Secondary | ICD-10-CM | POA: Diagnosis not present

## 2017-10-18 DIAGNOSIS — R55 Syncope and collapse: Secondary | ICD-10-CM | POA: Diagnosis not present

## 2017-10-19 DIAGNOSIS — I214 Non-ST elevation (NSTEMI) myocardial infarction: Secondary | ICD-10-CM | POA: Diagnosis not present

## 2017-10-19 DIAGNOSIS — R55 Syncope and collapse: Secondary | ICD-10-CM | POA: Diagnosis not present

## 2017-10-19 DIAGNOSIS — K219 Gastro-esophageal reflux disease without esophagitis: Secondary | ICD-10-CM | POA: Diagnosis not present

## 2017-10-19 DIAGNOSIS — N390 Urinary tract infection, site not specified: Secondary | ICD-10-CM | POA: Diagnosis not present

## 2017-10-21 DIAGNOSIS — I214 Non-ST elevation (NSTEMI) myocardial infarction: Secondary | ICD-10-CM | POA: Diagnosis not present

## 2017-10-21 DIAGNOSIS — E1122 Type 2 diabetes mellitus with diabetic chronic kidney disease: Secondary | ICD-10-CM | POA: Diagnosis not present

## 2017-10-21 DIAGNOSIS — I4891 Unspecified atrial fibrillation: Secondary | ICD-10-CM | POA: Diagnosis not present

## 2017-10-21 DIAGNOSIS — Z794 Long term (current) use of insulin: Secondary | ICD-10-CM | POA: Diagnosis not present

## 2017-10-21 DIAGNOSIS — K7460 Unspecified cirrhosis of liver: Secondary | ICD-10-CM | POA: Diagnosis not present

## 2017-10-21 DIAGNOSIS — N390 Urinary tract infection, site not specified: Secondary | ICD-10-CM | POA: Diagnosis not present

## 2017-10-21 DIAGNOSIS — Z9181 History of falling: Secondary | ICD-10-CM | POA: Diagnosis not present

## 2017-10-21 DIAGNOSIS — N189 Chronic kidney disease, unspecified: Secondary | ICD-10-CM | POA: Diagnosis not present

## 2017-10-24 DIAGNOSIS — N189 Chronic kidney disease, unspecified: Secondary | ICD-10-CM | POA: Diagnosis not present

## 2017-10-24 DIAGNOSIS — E1122 Type 2 diabetes mellitus with diabetic chronic kidney disease: Secondary | ICD-10-CM | POA: Diagnosis not present

## 2017-10-24 DIAGNOSIS — I214 Non-ST elevation (NSTEMI) myocardial infarction: Secondary | ICD-10-CM | POA: Diagnosis not present

## 2017-10-24 DIAGNOSIS — K7460 Unspecified cirrhosis of liver: Secondary | ICD-10-CM | POA: Diagnosis not present

## 2017-10-24 DIAGNOSIS — I4891 Unspecified atrial fibrillation: Secondary | ICD-10-CM | POA: Diagnosis not present

## 2017-10-24 DIAGNOSIS — N390 Urinary tract infection, site not specified: Secondary | ICD-10-CM | POA: Diagnosis not present

## 2017-10-25 DIAGNOSIS — Z23 Encounter for immunization: Secondary | ICD-10-CM | POA: Diagnosis not present

## 2017-10-26 DIAGNOSIS — N390 Urinary tract infection, site not specified: Secondary | ICD-10-CM | POA: Diagnosis not present

## 2017-10-26 DIAGNOSIS — E1122 Type 2 diabetes mellitus with diabetic chronic kidney disease: Secondary | ICD-10-CM | POA: Diagnosis not present

## 2017-10-26 DIAGNOSIS — I4891 Unspecified atrial fibrillation: Secondary | ICD-10-CM | POA: Diagnosis not present

## 2017-10-26 DIAGNOSIS — I214 Non-ST elevation (NSTEMI) myocardial infarction: Secondary | ICD-10-CM | POA: Diagnosis not present

## 2017-10-26 DIAGNOSIS — N189 Chronic kidney disease, unspecified: Secondary | ICD-10-CM | POA: Diagnosis not present

## 2017-10-26 DIAGNOSIS — K7460 Unspecified cirrhosis of liver: Secondary | ICD-10-CM | POA: Diagnosis not present

## 2017-10-27 DIAGNOSIS — I4891 Unspecified atrial fibrillation: Secondary | ICD-10-CM | POA: Diagnosis not present

## 2017-10-27 DIAGNOSIS — I214 Non-ST elevation (NSTEMI) myocardial infarction: Secondary | ICD-10-CM | POA: Diagnosis not present

## 2017-10-27 DIAGNOSIS — N189 Chronic kidney disease, unspecified: Secondary | ICD-10-CM | POA: Diagnosis not present

## 2017-10-27 DIAGNOSIS — N390 Urinary tract infection, site not specified: Secondary | ICD-10-CM | POA: Diagnosis not present

## 2017-10-27 DIAGNOSIS — E1122 Type 2 diabetes mellitus with diabetic chronic kidney disease: Secondary | ICD-10-CM | POA: Diagnosis not present

## 2017-10-27 DIAGNOSIS — K7460 Unspecified cirrhosis of liver: Secondary | ICD-10-CM | POA: Diagnosis not present

## 2017-10-31 DIAGNOSIS — K7460 Unspecified cirrhosis of liver: Secondary | ICD-10-CM | POA: Diagnosis not present

## 2017-10-31 DIAGNOSIS — I4891 Unspecified atrial fibrillation: Secondary | ICD-10-CM | POA: Diagnosis not present

## 2017-10-31 DIAGNOSIS — E1122 Type 2 diabetes mellitus with diabetic chronic kidney disease: Secondary | ICD-10-CM | POA: Diagnosis not present

## 2017-10-31 DIAGNOSIS — I214 Non-ST elevation (NSTEMI) myocardial infarction: Secondary | ICD-10-CM | POA: Diagnosis not present

## 2017-10-31 DIAGNOSIS — N189 Chronic kidney disease, unspecified: Secondary | ICD-10-CM | POA: Diagnosis not present

## 2017-10-31 DIAGNOSIS — N390 Urinary tract infection, site not specified: Secondary | ICD-10-CM | POA: Diagnosis not present

## 2017-11-01 DIAGNOSIS — E1122 Type 2 diabetes mellitus with diabetic chronic kidney disease: Secondary | ICD-10-CM | POA: Diagnosis not present

## 2017-11-01 DIAGNOSIS — I4891 Unspecified atrial fibrillation: Secondary | ICD-10-CM | POA: Diagnosis not present

## 2017-11-01 DIAGNOSIS — K7460 Unspecified cirrhosis of liver: Secondary | ICD-10-CM | POA: Diagnosis not present

## 2017-11-01 DIAGNOSIS — N189 Chronic kidney disease, unspecified: Secondary | ICD-10-CM | POA: Diagnosis not present

## 2017-11-01 DIAGNOSIS — N390 Urinary tract infection, site not specified: Secondary | ICD-10-CM | POA: Diagnosis not present

## 2017-11-01 DIAGNOSIS — I214 Non-ST elevation (NSTEMI) myocardial infarction: Secondary | ICD-10-CM | POA: Diagnosis not present

## 2017-11-02 DIAGNOSIS — N189 Chronic kidney disease, unspecified: Secondary | ICD-10-CM | POA: Diagnosis not present

## 2017-11-02 DIAGNOSIS — I4891 Unspecified atrial fibrillation: Secondary | ICD-10-CM | POA: Diagnosis not present

## 2017-11-02 DIAGNOSIS — N390 Urinary tract infection, site not specified: Secondary | ICD-10-CM | POA: Diagnosis not present

## 2017-11-02 DIAGNOSIS — E1122 Type 2 diabetes mellitus with diabetic chronic kidney disease: Secondary | ICD-10-CM | POA: Diagnosis not present

## 2017-11-02 DIAGNOSIS — K7460 Unspecified cirrhosis of liver: Secondary | ICD-10-CM | POA: Diagnosis not present

## 2017-11-02 DIAGNOSIS — I214 Non-ST elevation (NSTEMI) myocardial infarction: Secondary | ICD-10-CM | POA: Diagnosis not present

## 2017-11-03 DIAGNOSIS — N189 Chronic kidney disease, unspecified: Secondary | ICD-10-CM | POA: Diagnosis not present

## 2017-11-03 DIAGNOSIS — N390 Urinary tract infection, site not specified: Secondary | ICD-10-CM | POA: Diagnosis not present

## 2017-11-03 DIAGNOSIS — I214 Non-ST elevation (NSTEMI) myocardial infarction: Secondary | ICD-10-CM | POA: Diagnosis not present

## 2017-11-03 DIAGNOSIS — I4891 Unspecified atrial fibrillation: Secondary | ICD-10-CM | POA: Diagnosis not present

## 2017-11-03 DIAGNOSIS — K7460 Unspecified cirrhosis of liver: Secondary | ICD-10-CM | POA: Diagnosis not present

## 2017-11-03 DIAGNOSIS — E1122 Type 2 diabetes mellitus with diabetic chronic kidney disease: Secondary | ICD-10-CM | POA: Diagnosis not present

## 2017-11-06 DIAGNOSIS — N390 Urinary tract infection, site not specified: Secondary | ICD-10-CM | POA: Diagnosis not present

## 2017-11-06 DIAGNOSIS — K7460 Unspecified cirrhosis of liver: Secondary | ICD-10-CM | POA: Diagnosis not present

## 2017-11-06 DIAGNOSIS — N189 Chronic kidney disease, unspecified: Secondary | ICD-10-CM | POA: Diagnosis not present

## 2017-11-06 DIAGNOSIS — I214 Non-ST elevation (NSTEMI) myocardial infarction: Secondary | ICD-10-CM | POA: Diagnosis not present

## 2017-11-06 DIAGNOSIS — I4891 Unspecified atrial fibrillation: Secondary | ICD-10-CM | POA: Diagnosis not present

## 2017-11-06 DIAGNOSIS — E1122 Type 2 diabetes mellitus with diabetic chronic kidney disease: Secondary | ICD-10-CM | POA: Diagnosis not present

## 2017-11-07 DIAGNOSIS — N390 Urinary tract infection, site not specified: Secondary | ICD-10-CM | POA: Diagnosis not present

## 2017-11-07 DIAGNOSIS — I4891 Unspecified atrial fibrillation: Secondary | ICD-10-CM | POA: Diagnosis not present

## 2017-11-07 DIAGNOSIS — N189 Chronic kidney disease, unspecified: Secondary | ICD-10-CM | POA: Diagnosis not present

## 2017-11-07 DIAGNOSIS — I214 Non-ST elevation (NSTEMI) myocardial infarction: Secondary | ICD-10-CM | POA: Diagnosis not present

## 2017-11-07 DIAGNOSIS — K7460 Unspecified cirrhosis of liver: Secondary | ICD-10-CM | POA: Diagnosis not present

## 2017-11-07 DIAGNOSIS — E1122 Type 2 diabetes mellitus with diabetic chronic kidney disease: Secondary | ICD-10-CM | POA: Diagnosis not present

## 2017-11-08 DIAGNOSIS — E1122 Type 2 diabetes mellitus with diabetic chronic kidney disease: Secondary | ICD-10-CM | POA: Diagnosis not present

## 2017-11-08 DIAGNOSIS — I214 Non-ST elevation (NSTEMI) myocardial infarction: Secondary | ICD-10-CM | POA: Diagnosis not present

## 2017-11-08 DIAGNOSIS — I4891 Unspecified atrial fibrillation: Secondary | ICD-10-CM | POA: Diagnosis not present

## 2017-11-08 DIAGNOSIS — N390 Urinary tract infection, site not specified: Secondary | ICD-10-CM | POA: Diagnosis not present

## 2017-11-08 DIAGNOSIS — N189 Chronic kidney disease, unspecified: Secondary | ICD-10-CM | POA: Diagnosis not present

## 2017-11-08 DIAGNOSIS — K7460 Unspecified cirrhosis of liver: Secondary | ICD-10-CM | POA: Diagnosis not present

## 2017-11-10 DIAGNOSIS — K7460 Unspecified cirrhosis of liver: Secondary | ICD-10-CM | POA: Diagnosis not present

## 2017-11-10 DIAGNOSIS — I4891 Unspecified atrial fibrillation: Secondary | ICD-10-CM | POA: Diagnosis not present

## 2017-11-10 DIAGNOSIS — E1122 Type 2 diabetes mellitus with diabetic chronic kidney disease: Secondary | ICD-10-CM | POA: Diagnosis not present

## 2017-11-10 DIAGNOSIS — N390 Urinary tract infection, site not specified: Secondary | ICD-10-CM | POA: Diagnosis not present

## 2017-11-10 DIAGNOSIS — I214 Non-ST elevation (NSTEMI) myocardial infarction: Secondary | ICD-10-CM | POA: Diagnosis not present

## 2017-11-10 DIAGNOSIS — N189 Chronic kidney disease, unspecified: Secondary | ICD-10-CM | POA: Diagnosis not present

## 2017-11-13 DIAGNOSIS — D485 Neoplasm of uncertain behavior of skin: Secondary | ICD-10-CM | POA: Diagnosis not present

## 2017-11-13 DIAGNOSIS — E1122 Type 2 diabetes mellitus with diabetic chronic kidney disease: Secondary | ICD-10-CM | POA: Diagnosis not present

## 2017-11-13 DIAGNOSIS — K7460 Unspecified cirrhosis of liver: Secondary | ICD-10-CM | POA: Diagnosis not present

## 2017-11-13 DIAGNOSIS — L570 Actinic keratosis: Secondary | ICD-10-CM | POA: Diagnosis not present

## 2017-11-13 DIAGNOSIS — C4442 Squamous cell carcinoma of skin of scalp and neck: Secondary | ICD-10-CM | POA: Diagnosis not present

## 2017-11-13 DIAGNOSIS — N390 Urinary tract infection, site not specified: Secondary | ICD-10-CM | POA: Diagnosis not present

## 2017-11-13 DIAGNOSIS — I214 Non-ST elevation (NSTEMI) myocardial infarction: Secondary | ICD-10-CM | POA: Diagnosis not present

## 2017-11-13 DIAGNOSIS — I4891 Unspecified atrial fibrillation: Secondary | ICD-10-CM | POA: Diagnosis not present

## 2017-11-13 DIAGNOSIS — N189 Chronic kidney disease, unspecified: Secondary | ICD-10-CM | POA: Diagnosis not present

## 2017-11-14 ENCOUNTER — Ambulatory Visit (INDEPENDENT_AMBULATORY_CARE_PROVIDER_SITE_OTHER): Payer: Medicare Other | Admitting: Internal Medicine

## 2017-11-14 DIAGNOSIS — K7460 Unspecified cirrhosis of liver: Secondary | ICD-10-CM | POA: Diagnosis not present

## 2017-11-14 DIAGNOSIS — E1122 Type 2 diabetes mellitus with diabetic chronic kidney disease: Secondary | ICD-10-CM | POA: Diagnosis not present

## 2017-11-14 DIAGNOSIS — I214 Non-ST elevation (NSTEMI) myocardial infarction: Secondary | ICD-10-CM | POA: Diagnosis not present

## 2017-11-14 DIAGNOSIS — I4891 Unspecified atrial fibrillation: Secondary | ICD-10-CM | POA: Diagnosis not present

## 2017-11-14 DIAGNOSIS — N390 Urinary tract infection, site not specified: Secondary | ICD-10-CM | POA: Diagnosis not present

## 2017-11-14 DIAGNOSIS — N189 Chronic kidney disease, unspecified: Secondary | ICD-10-CM | POA: Diagnosis not present

## 2017-11-15 DIAGNOSIS — I4891 Unspecified atrial fibrillation: Secondary | ICD-10-CM | POA: Diagnosis not present

## 2017-11-15 DIAGNOSIS — I214 Non-ST elevation (NSTEMI) myocardial infarction: Secondary | ICD-10-CM | POA: Diagnosis not present

## 2017-11-15 DIAGNOSIS — N390 Urinary tract infection, site not specified: Secondary | ICD-10-CM | POA: Diagnosis not present

## 2017-11-15 DIAGNOSIS — K7460 Unspecified cirrhosis of liver: Secondary | ICD-10-CM | POA: Diagnosis not present

## 2017-11-15 DIAGNOSIS — N189 Chronic kidney disease, unspecified: Secondary | ICD-10-CM | POA: Diagnosis not present

## 2017-11-15 DIAGNOSIS — E1122 Type 2 diabetes mellitus with diabetic chronic kidney disease: Secondary | ICD-10-CM | POA: Diagnosis not present

## 2017-11-16 DIAGNOSIS — I4891 Unspecified atrial fibrillation: Secondary | ICD-10-CM | POA: Diagnosis not present

## 2017-11-16 DIAGNOSIS — I214 Non-ST elevation (NSTEMI) myocardial infarction: Secondary | ICD-10-CM | POA: Diagnosis not present

## 2017-11-16 DIAGNOSIS — N390 Urinary tract infection, site not specified: Secondary | ICD-10-CM | POA: Diagnosis not present

## 2017-11-16 DIAGNOSIS — N189 Chronic kidney disease, unspecified: Secondary | ICD-10-CM | POA: Diagnosis not present

## 2017-11-16 DIAGNOSIS — E1122 Type 2 diabetes mellitus with diabetic chronic kidney disease: Secondary | ICD-10-CM | POA: Diagnosis not present

## 2017-11-16 DIAGNOSIS — K7460 Unspecified cirrhosis of liver: Secondary | ICD-10-CM | POA: Diagnosis not present

## 2017-11-17 DIAGNOSIS — N189 Chronic kidney disease, unspecified: Secondary | ICD-10-CM | POA: Diagnosis not present

## 2017-11-17 DIAGNOSIS — I4891 Unspecified atrial fibrillation: Secondary | ICD-10-CM | POA: Diagnosis not present

## 2017-11-17 DIAGNOSIS — E1122 Type 2 diabetes mellitus with diabetic chronic kidney disease: Secondary | ICD-10-CM | POA: Diagnosis not present

## 2017-11-17 DIAGNOSIS — I214 Non-ST elevation (NSTEMI) myocardial infarction: Secondary | ICD-10-CM | POA: Diagnosis not present

## 2017-11-17 DIAGNOSIS — K7460 Unspecified cirrhosis of liver: Secondary | ICD-10-CM | POA: Diagnosis not present

## 2017-11-17 DIAGNOSIS — N390 Urinary tract infection, site not specified: Secondary | ICD-10-CM | POA: Diagnosis not present

## 2017-11-19 DIAGNOSIS — C220 Liver cell carcinoma: Secondary | ICD-10-CM | POA: Diagnosis not present

## 2017-11-19 DIAGNOSIS — N183 Chronic kidney disease, stage 3 (moderate): Secondary | ICD-10-CM | POA: Diagnosis not present

## 2017-11-19 DIAGNOSIS — I482 Chronic atrial fibrillation, unspecified: Secondary | ICD-10-CM | POA: Diagnosis not present

## 2017-11-19 DIAGNOSIS — D649 Anemia, unspecified: Secondary | ICD-10-CM | POA: Diagnosis not present

## 2017-11-19 DIAGNOSIS — I213 ST elevation (STEMI) myocardial infarction of unspecified site: Secondary | ICD-10-CM | POA: Diagnosis not present

## 2017-11-19 DIAGNOSIS — D696 Thrombocytopenia, unspecified: Secondary | ICD-10-CM | POA: Diagnosis not present

## 2017-11-19 DIAGNOSIS — E1165 Type 2 diabetes mellitus with hyperglycemia: Secondary | ICD-10-CM | POA: Diagnosis not present

## 2017-11-19 DIAGNOSIS — N390 Urinary tract infection, site not specified: Secondary | ICD-10-CM | POA: Diagnosis not present

## 2017-11-20 ENCOUNTER — Ambulatory Visit (INDEPENDENT_AMBULATORY_CARE_PROVIDER_SITE_OTHER): Payer: Medicare Other | Admitting: Internal Medicine

## 2017-11-20 ENCOUNTER — Other Ambulatory Visit (INDEPENDENT_AMBULATORY_CARE_PROVIDER_SITE_OTHER): Payer: Self-pay | Admitting: Internal Medicine

## 2017-11-20 ENCOUNTER — Encounter (INDEPENDENT_AMBULATORY_CARE_PROVIDER_SITE_OTHER): Payer: Self-pay | Admitting: Internal Medicine

## 2017-11-20 VITALS — BP 120/64 | HR 65 | Temp 97.7°F | Resp 18 | Ht 62.0 in | Wt 203.4 lb

## 2017-11-20 DIAGNOSIS — K7469 Other cirrhosis of liver: Secondary | ICD-10-CM

## 2017-11-20 DIAGNOSIS — E1122 Type 2 diabetes mellitus with diabetic chronic kidney disease: Secondary | ICD-10-CM | POA: Diagnosis not present

## 2017-11-20 DIAGNOSIS — N189 Chronic kidney disease, unspecified: Secondary | ICD-10-CM | POA: Diagnosis not present

## 2017-11-20 DIAGNOSIS — I214 Non-ST elevation (NSTEMI) myocardial infarction: Secondary | ICD-10-CM | POA: Diagnosis not present

## 2017-11-20 DIAGNOSIS — C220 Liver cell carcinoma: Secondary | ICD-10-CM | POA: Diagnosis not present

## 2017-11-20 DIAGNOSIS — C229 Malignant neoplasm of liver, not specified as primary or secondary: Secondary | ICD-10-CM

## 2017-11-20 DIAGNOSIS — N390 Urinary tract infection, site not specified: Secondary | ICD-10-CM | POA: Diagnosis not present

## 2017-11-20 DIAGNOSIS — K7460 Unspecified cirrhosis of liver: Secondary | ICD-10-CM

## 2017-11-20 DIAGNOSIS — I4891 Unspecified atrial fibrillation: Secondary | ICD-10-CM | POA: Diagnosis not present

## 2017-11-20 NOTE — Addendum Note (Signed)
Addended by: Rogene Houston on: 11/20/2017 02:07 PM   Modules accepted: Orders

## 2017-11-21 DIAGNOSIS — N189 Chronic kidney disease, unspecified: Secondary | ICD-10-CM | POA: Diagnosis not present

## 2017-11-21 DIAGNOSIS — I4891 Unspecified atrial fibrillation: Secondary | ICD-10-CM | POA: Diagnosis not present

## 2017-11-21 DIAGNOSIS — I214 Non-ST elevation (NSTEMI) myocardial infarction: Secondary | ICD-10-CM | POA: Diagnosis not present

## 2017-11-21 DIAGNOSIS — K7460 Unspecified cirrhosis of liver: Secondary | ICD-10-CM | POA: Diagnosis not present

## 2017-11-21 DIAGNOSIS — N390 Urinary tract infection, site not specified: Secondary | ICD-10-CM | POA: Diagnosis not present

## 2017-11-21 DIAGNOSIS — E1122 Type 2 diabetes mellitus with diabetic chronic kidney disease: Secondary | ICD-10-CM | POA: Diagnosis not present

## 2017-11-21 NOTE — Progress Notes (Signed)
Presenting complaint;  Follow-up for hepatic encephalopathy and hepatocellular carcinoma.  Database and subjective:  Patient is 77 year old Caucasian female who has cirrhosis secondary to NASH with multiple complications who was here for scheduled visit accompanied by her niece Vaughan Basta who has power of attorney. Patient's illness began with diagnosis of esophageal varices which were banded.  She is never bled. She was then found to have [place] on routine screening and underwent thermal ablation in January 2018.  She had transient drop in her AFP.  She was retreated in March this year.  Second procedure was complicated by large pleural effusion requiring thoracenteses and hospitalization. Since she was last seen she also developed confusion and diagnosed with hepatic encephalopathy and begun on lactulose. Her niece tells me that when she had syncopal episode and was hospitalized in [place] and was diagnosed with MI as well.  Patient states she is doing well.  She lives alone.  She does the usual household work cooking.  She still drives.  She has [employer].  Her nephew lives next door and she also has a neighbor who regularly checks on her.  She denies nausea vomiting heartburn dysphagia abdominal pain melena or rectal bleeding. She has lost 28 pounds since her last visit of February 2019.  She feels most of the weight loss is due to diuresis. She has decided not to pursue with further therapy but wants to know the rate of progression of her disease.   Current Medications: Outpatient Encounter Medications as of 11/20/2017  Medication Sig  . ALPRAZolam (XANAX) 0.5 MG tablet Take 0.5 mg by mouth 2 (two) times daily as needed for anxiety.  . Insulin Glargine (LANTUS SOLOSTAR) 100 UNIT/ML Solostar Pen Inject 25 Units into the skin at bedtime.  . Insulin Isophane & Regular Human (HUMULIN 70/30 KWIKPEN) (70-30) 100 UNIT/ML PEN Inject 30 Units into the skin daily.  . isosorbide dinitrate (DILATRATE-SR)  40 MG CR capsule Take 10 mg by mouth 2 (two) times daily.  Marland Kitchen lactulose, encephalopathy, (GENERLAC) 10 GM/15ML SOLN Take 30 g by mouth 3 (three) times daily.  . metoprolol tartrate (LOPRESSOR) 25 MG tablet Take 12.5 mg by mouth 2 (two) times daily.  . pantoprazole (PROTONIX) 40 MG tablet TAKE 1 TABLET BY MOUTH EVERY DAY (Patient taking differently: Take 40 mg by mouth once a day in the evening)  . spironolactone (ALDACTONE) 100 MG tablet Take 1 tablet (100 mg total) by mouth daily. (Patient taking differently: Take 25 mg by mouth daily. )  . torsemide (DEMADEX) 20 MG tablet Take 20 mg by mouth 2 (two) times daily.  . [DISCONTINUED] calcium-vitamin D (OSCAL WITH D) 500-200 MG-UNIT tablet Take 1 tablet by mouth daily. (Patient not taking: Reported on 11/20/2017)  . [DISCONTINUED] furosemide (LASIX) 80 MG tablet Take one tab (59m) by mouth every morning & 1/2 tab (447m every evening (Patient not taking: Reported on 11/20/2017)  . [DISCONTINUED] glipiZIDE (GLUCOTROL) 5 MG tablet Take by mouth daily before breakfast.  . [DISCONTINUED] insulin aspart (NOVOLOG) 100 UNIT/ML injection Inject 0-5 Units into the skin at bedtime. (Patient not taking: Reported on 11/20/2017)  . [DISCONTINUED] insulin aspart (NOVOLOG) 100 UNIT/ML injection Inject 0-15 Units into the skin 3 (three) times daily with meals. (Patient not taking: Reported on 11/20/2017)  . [DISCONTINUED] insulin glargine (LANTUS) 100 UNIT/ML injection Inject 20 Units into the skin at bedtime.  . [DISCONTINUED] metFORMIN (GLUCOPHAGE) 1000 MG tablet Take 1,000 mg by mouth 2 (two) times daily with a meal.  . [DISCONTINUED] Multiple Vitamin (  MULTIVITAMIN WITH MINERALS) TABS tablet Take 1 tablet by mouth daily. (Patient not taking: Reported on 11/20/2017)  . [DISCONTINUED] potassium chloride SA (K-DUR,KLOR-CON) 20 MEQ tablet Take 2 tablets (40 mEq total) by mouth 2 (two) times daily. (Patient not taking: Reported on 11/20/2017)  . [DISCONTINUED] propranolol  (INDERAL) 10 MG tablet Take 0.5 tablets (5 mg total) by mouth 2 (two) times daily. (Patient not taking: Reported on 11/20/2017)  . [DISCONTINUED] QUEtiapine (SEROQUEL) 25 MG tablet Take 1 tablet (25 mg total) by mouth at bedtime. (Patient not taking: Reported on 06/08/2017)  . [DISCONTINUED] temazepam (RESTORIL) 15 MG capsule Take 15 mg by mouth at bedtime as needed for sleep.   No facility-administered encounter medications on file as of 11/20/2017.      Objective: Blood pressure 120/64, pulse 65, temperature 97.7 F (36.5 C), temperature source Oral, resp. rate 18, height 5' 2"  (1.575 m), weight 203 lb 6.4 oz (92.3 kg). Patient is alert and in no acute distress. She does not have asterixis. Conjunctiva is pink. Sclera is nonicteric Oropharyngeal mucosa is normal. No neck masses or thyromegaly noted. Cardiac exam with regular rhythm normal S1 and S2. No murmur or gallop noted. Lungs are clear to auscultation. Abdomen is obese but soft and nontender with organomegaly or masses. No LE edema or clubbing noted.  Labs/studies Results:  Lab data from 10/11/2017 Alpha-fetoprotein 558 Serum ammonia 32.  Assessment:  #1.  Cirrhosis secondary to NASH with multiple complications.  #2.  Hepatocellular carcinoma.  Patient has undergone ablative therapy twice in the past.  She had lesion in segment for a treated with thermal ablation in January 2018 and more recently in March 2019.  Procedure in March was complicated by dyspnea and large right pleural effusion requiring hospitalization and thoracentesis.  Now she has rising AFP concerning for progressive disease.  She is presently asymptomatic and head to offer palliative therapy and make her sicker.  Best option may be to watch her closely.  #3.  Hepatic encephalopathy.  She is doing well with lactulose.   Plan:  Right upper quadrant abdominal ultrasound along with Doppler study to examine hepato-portal venous system. Office visit in 3  months.

## 2017-11-21 NOTE — Patient Instructions (Signed)
Physician will call with results of imaging studies been completed.

## 2017-11-22 DIAGNOSIS — E1122 Type 2 diabetes mellitus with diabetic chronic kidney disease: Secondary | ICD-10-CM | POA: Diagnosis not present

## 2017-11-22 DIAGNOSIS — K7460 Unspecified cirrhosis of liver: Secondary | ICD-10-CM | POA: Diagnosis not present

## 2017-11-22 DIAGNOSIS — N390 Urinary tract infection, site not specified: Secondary | ICD-10-CM | POA: Diagnosis not present

## 2017-11-22 DIAGNOSIS — N189 Chronic kidney disease, unspecified: Secondary | ICD-10-CM | POA: Diagnosis not present

## 2017-11-22 DIAGNOSIS — I4891 Unspecified atrial fibrillation: Secondary | ICD-10-CM | POA: Diagnosis not present

## 2017-11-22 DIAGNOSIS — I214 Non-ST elevation (NSTEMI) myocardial infarction: Secondary | ICD-10-CM | POA: Diagnosis not present

## 2017-11-23 DIAGNOSIS — I4891 Unspecified atrial fibrillation: Secondary | ICD-10-CM | POA: Diagnosis not present

## 2017-11-23 DIAGNOSIS — E1122 Type 2 diabetes mellitus with diabetic chronic kidney disease: Secondary | ICD-10-CM | POA: Diagnosis not present

## 2017-11-23 DIAGNOSIS — I214 Non-ST elevation (NSTEMI) myocardial infarction: Secondary | ICD-10-CM | POA: Diagnosis not present

## 2017-11-23 DIAGNOSIS — K7460 Unspecified cirrhosis of liver: Secondary | ICD-10-CM | POA: Diagnosis not present

## 2017-11-23 DIAGNOSIS — N390 Urinary tract infection, site not specified: Secondary | ICD-10-CM | POA: Diagnosis not present

## 2017-11-23 DIAGNOSIS — N189 Chronic kidney disease, unspecified: Secondary | ICD-10-CM | POA: Diagnosis not present

## 2017-11-25 DIAGNOSIS — K7460 Unspecified cirrhosis of liver: Secondary | ICD-10-CM | POA: Diagnosis not present

## 2017-11-25 DIAGNOSIS — N390 Urinary tract infection, site not specified: Secondary | ICD-10-CM | POA: Diagnosis not present

## 2017-11-25 DIAGNOSIS — I4891 Unspecified atrial fibrillation: Secondary | ICD-10-CM | POA: Diagnosis not present

## 2017-11-25 DIAGNOSIS — E1122 Type 2 diabetes mellitus with diabetic chronic kidney disease: Secondary | ICD-10-CM | POA: Diagnosis not present

## 2017-11-25 DIAGNOSIS — N189 Chronic kidney disease, unspecified: Secondary | ICD-10-CM | POA: Diagnosis not present

## 2017-11-25 DIAGNOSIS — I214 Non-ST elevation (NSTEMI) myocardial infarction: Secondary | ICD-10-CM | POA: Diagnosis not present

## 2017-11-27 ENCOUNTER — Ambulatory Visit (HOSPITAL_COMMUNITY)
Admission: RE | Admit: 2017-11-27 | Discharge: 2017-11-27 | Disposition: A | Discharge status: Home / Self Care | Payer: Medicare Other | Source: Ambulatory Visit | Attending: Internal Medicine | Admitting: Internal Medicine

## 2017-11-27 DIAGNOSIS — K7689 Other specified diseases of liver: Secondary | ICD-10-CM | POA: Insufficient documentation

## 2017-11-27 DIAGNOSIS — R161 Splenomegaly, not elsewhere classified: Secondary | ICD-10-CM | POA: Diagnosis not present

## 2017-11-27 DIAGNOSIS — J90 Pleural effusion, not elsewhere classified: Secondary | ICD-10-CM | POA: Diagnosis not present

## 2017-11-27 DIAGNOSIS — K7460 Unspecified cirrhosis of liver: Secondary | ICD-10-CM | POA: Diagnosis not present

## 2017-11-27 DIAGNOSIS — K8020 Calculus of gallbladder without cholecystitis without obstruction: Secondary | ICD-10-CM | POA: Insufficient documentation

## 2017-11-27 DIAGNOSIS — C220 Liver cell carcinoma: Secondary | ICD-10-CM | POA: Diagnosis not present

## 2017-11-28 DIAGNOSIS — I214 Non-ST elevation (NSTEMI) myocardial infarction: Secondary | ICD-10-CM | POA: Diagnosis not present

## 2017-11-28 DIAGNOSIS — E1122 Type 2 diabetes mellitus with diabetic chronic kidney disease: Secondary | ICD-10-CM | POA: Diagnosis not present

## 2017-11-28 DIAGNOSIS — N189 Chronic kidney disease, unspecified: Secondary | ICD-10-CM | POA: Diagnosis not present

## 2017-11-28 DIAGNOSIS — I4891 Unspecified atrial fibrillation: Secondary | ICD-10-CM | POA: Diagnosis not present

## 2017-11-28 DIAGNOSIS — N390 Urinary tract infection, site not specified: Secondary | ICD-10-CM | POA: Diagnosis not present

## 2017-11-28 DIAGNOSIS — K7460 Unspecified cirrhosis of liver: Secondary | ICD-10-CM | POA: Diagnosis not present

## 2017-11-30 DIAGNOSIS — N189 Chronic kidney disease, unspecified: Secondary | ICD-10-CM | POA: Diagnosis not present

## 2017-11-30 DIAGNOSIS — E1122 Type 2 diabetes mellitus with diabetic chronic kidney disease: Secondary | ICD-10-CM | POA: Diagnosis not present

## 2017-11-30 DIAGNOSIS — K7460 Unspecified cirrhosis of liver: Secondary | ICD-10-CM | POA: Diagnosis not present

## 2017-11-30 DIAGNOSIS — I4891 Unspecified atrial fibrillation: Secondary | ICD-10-CM | POA: Diagnosis not present

## 2017-11-30 DIAGNOSIS — I214 Non-ST elevation (NSTEMI) myocardial infarction: Secondary | ICD-10-CM | POA: Diagnosis not present

## 2017-11-30 DIAGNOSIS — N390 Urinary tract infection, site not specified: Secondary | ICD-10-CM | POA: Diagnosis not present

## 2017-12-01 ENCOUNTER — Ambulatory Visit (HOSPITAL_COMMUNITY): Payer: Medicare Other

## 2017-12-07 DIAGNOSIS — L570 Actinic keratosis: Secondary | ICD-10-CM | POA: Diagnosis not present

## 2017-12-07 DIAGNOSIS — C4442 Squamous cell carcinoma of skin of scalp and neck: Secondary | ICD-10-CM | POA: Diagnosis not present

## 2018-01-03 DIAGNOSIS — D649 Anemia, unspecified: Secondary | ICD-10-CM | POA: Diagnosis not present

## 2018-01-03 DIAGNOSIS — E782 Mixed hyperlipidemia: Secondary | ICD-10-CM | POA: Diagnosis not present

## 2018-01-03 DIAGNOSIS — E1165 Type 2 diabetes mellitus with hyperglycemia: Secondary | ICD-10-CM | POA: Diagnosis not present

## 2018-01-03 DIAGNOSIS — I10 Essential (primary) hypertension: Secondary | ICD-10-CM | POA: Diagnosis not present

## 2018-01-03 DIAGNOSIS — K7460 Unspecified cirrhosis of liver: Secondary | ICD-10-CM | POA: Diagnosis not present

## 2018-01-03 DIAGNOSIS — N183 Chronic kidney disease, stage 3 (moderate): Secondary | ICD-10-CM | POA: Diagnosis not present

## 2018-01-03 DIAGNOSIS — E039 Hypothyroidism, unspecified: Secondary | ICD-10-CM | POA: Diagnosis not present

## 2018-01-03 DIAGNOSIS — D696 Thrombocytopenia, unspecified: Secondary | ICD-10-CM | POA: Diagnosis not present

## 2018-01-03 DIAGNOSIS — I4891 Unspecified atrial fibrillation: Secondary | ICD-10-CM | POA: Diagnosis not present

## 2018-01-03 DIAGNOSIS — Z6835 Body mass index (BMI) 35.0-35.9, adult: Secondary | ICD-10-CM | POA: Diagnosis not present

## 2018-01-03 DIAGNOSIS — E7220 Disorder of urea cycle metabolism, unspecified: Secondary | ICD-10-CM | POA: Diagnosis not present

## 2018-01-03 DIAGNOSIS — I5030 Unspecified diastolic (congestive) heart failure: Secondary | ICD-10-CM | POA: Diagnosis not present

## 2018-01-03 DIAGNOSIS — C220 Liver cell carcinoma: Secondary | ICD-10-CM | POA: Diagnosis not present

## 2018-01-03 DIAGNOSIS — I482 Chronic atrial fibrillation, unspecified: Secondary | ICD-10-CM | POA: Diagnosis not present

## 2018-01-15 DIAGNOSIS — C44622 Squamous cell carcinoma of skin of right upper limb, including shoulder: Secondary | ICD-10-CM | POA: Diagnosis not present

## 2018-01-15 DIAGNOSIS — L570 Actinic keratosis: Secondary | ICD-10-CM | POA: Diagnosis not present

## 2018-01-15 DIAGNOSIS — D0462 Carcinoma in situ of skin of left upper limb, including shoulder: Secondary | ICD-10-CM | POA: Diagnosis not present

## 2018-01-15 DIAGNOSIS — C44629 Squamous cell carcinoma of skin of left upper limb, including shoulder: Secondary | ICD-10-CM | POA: Diagnosis not present

## 2018-01-15 DIAGNOSIS — D485 Neoplasm of uncertain behavior of skin: Secondary | ICD-10-CM | POA: Diagnosis not present

## 2018-01-18 ENCOUNTER — Ambulatory Visit (INDEPENDENT_AMBULATORY_CARE_PROVIDER_SITE_OTHER): Payer: Medicare Other | Admitting: Cardiology

## 2018-01-18 ENCOUNTER — Encounter: Payer: Self-pay | Admitting: Cardiology

## 2018-01-18 ENCOUNTER — Encounter

## 2018-01-18 VITALS — BP 138/72 | HR 77 | Ht 62.0 in | Wt 198.4 lb

## 2018-01-18 DIAGNOSIS — I5032 Chronic diastolic (congestive) heart failure: Secondary | ICD-10-CM | POA: Diagnosis not present

## 2018-01-18 DIAGNOSIS — I10 Essential (primary) hypertension: Secondary | ICD-10-CM

## 2018-01-18 NOTE — Patient Instructions (Signed)

## 2018-01-18 NOTE — Progress Notes (Signed)
Clinical Summary Ms. Cressey is a 77 y.o.female seen today for follow up of the following medical problems.   1. Chronic diastolic heart failure.  - echo 10/30/13 LVEF 55-60%, grade II diastolic dysfunction  - no recent edema, no SOB/DOE - now on torsemide and doing well.   2. HTN - compliant with meds  3. Liver cirrhosis - followed by GI. Secondary to NASH with thrombocytopenia and leukopenia. +esophageal varices with banding. Notes also mention history of hepatocellular carcinoma.     4. History of breast CA     Past Medical History:  Diagnosis Date  . Arthritis    "fingers, left knee" (05/03/2017)  . Cancer of right breast ([place]) 2005  . Dyspnea    shortness of breath with any exertion  . GERD (gastroesophageal reflux disease)   . Hepatocellular carcinoma ([place])    segment 4 hepatocellular carcinoma/notes 04/26/2017  . History of chicken pox   . History of transfusion of platelets    "w/liver treatments"  . Hyperlipidemia   . Hypertension   . Hypothyroidism   . Liver cirrhosis secondary to NASH ([place])    Archie Endo 04/26/2017  . Thyroid disease   . Type II diabetes mellitus ([facility])      No Known Allergies   Current Outpatient Medications  Medication Sig Dispense Refill  . ALPRAZolam (XANAX) 0.5 MG tablet Take 0.5 mg by mouth 2 (two) times daily as needed for anxiety.    . Insulin Glargine (LANTUS SOLOSTAR) 100 UNIT/ML Solostar Pen Inject 25 Units into the skin at bedtime.    . Insulin Isophane & Regular Human (HUMULIN 70/30 KWIKPEN) (70-30) 100 UNIT/ML PEN Inject 30 Units into the skin daily.    . isosorbide dinitrate (DILATRATE-SR) 40 MG CR capsule Take 10 mg by mouth 2 (two) times daily.    Marland Kitchen lactulose, encephalopathy, (GENERLAC) 10 GM/15ML SOLN Take 30 g by mouth 3 (three) times daily.    . metoprolol tartrate (LOPRESSOR) 25 MG tablet Take 12.5 mg by mouth 2 (two) times daily.    . pantoprazole (PROTONIX) 40 MG tablet TAKE 1 TABLET BY MOUTH EVERY DAY  (Patient taking differently: Take 40 mg by mouth once a day in the evening) 90 tablet 4  . spironolactone (ALDACTONE) 100 MG tablet Take 1 tablet (100 mg total) by mouth daily. (Patient taking differently: Take 25 mg by mouth daily. )    . torsemide (DEMADEX) 20 MG tablet Take 20 mg by mouth 2 (two) times daily.     No current facility-administered medications for this visit.      Past Surgical History:  Procedure Laterality Date  . ABDOMINAL HYSTERECTOMY  1987  . BREAST BIOPSY Right 2005  . BREAST LUMPECTOMY Right 2005  . CATARACT EXTRACTION W/ INTRAOCULAR LENS  IMPLANT, BILATERAL Bilateral   . ESOPHAGEAL BANDING N/A 12/13/2012   Procedure: ESOPHAGEAL BANDING;  Surgeon: Rogene Houston, MD;  [facility];  Service: Endoscopy;  Laterality: N/A;  . ESOPHAGOGASTRODUODENOSCOPY N/A 09/20/2012   Procedure: ESOPHAGOGASTRODUODENOSCOPY (EGD);  Surgeon: Rogene Houston, MD;  [facility]: [facility];  Service: Endoscopy;  Laterality: N/A;  125-moved to 12:00 Ann notified pt  . ESOPHAGOGASTRODUODENOSCOPY N/A 12/13/2012   Procedure: ESOPHAGOGASTRODUODENOSCOPY (EGD);  Surgeon: Rogene Houston, MD;  [facility]: [facility];  Service: Endoscopy;  Laterality: N/A;  155-moved to 220 Ann to notify pt  . ESOPHAGOGASTRODUODENOSCOPY N/A 01/11/2016   Procedure: ESOPHAGOGASTRODUODENOSCOPY (EGD);  Surgeon: Rogene Houston, MD;  [facility]: [facility];  Service: Endoscopy;  Laterality: N/A;  7:30  . HERNIA REPAIR  2004   "belly button"  . IR GENERIC HISTORICAL  01/19/2016   IR RADIOLOGIST EVAL & MGMT 01/19/2016 Aletta Edouard, MD [facility] INTERV RAD  . IR GENERIC HISTORICAL  03/29/2016   IR RADIOLOGIST EVAL & MGMT 03/29/2016 Aletta Edouard, MD [facility] INTER[facility] RAD  . IR RADIOLOGIST EVAL & MGMT  06/14/2016  . IR RADIOLOGIST EVAL & MGMT  11/24/2016  . IR RADIOLOGIST EVAL & MGMT  12/13/2016  . IR RADIOLOGIST EVAL & MGMT  03/15/2017  . IR RADIOLOGIST EVAL & MGMT  06/08/2017  . IR RADIOLOGY PERIPHERAL GUIDED IV  START  01/18/2017  . IR THORACENTESIS ASP PLEURAL SPACE W/IMG GUIDE  05/23/2017  . IR US GUIDE VASC ACCESS LEFT  01/18/2017  . RADIOFREQUENCY ABLATION N/A 03/11/2016   Procedure: LIVER THERMAL ABLATION;  Surgeon: Aletta Edouard, MD;  Location: [facility];  Service: Anesthesiology;  Laterality: N/A;  . RADIOLOGY WITH ANESTHESIA N/A 04/26/2017   Procedure: CT MICROWAVE THERMAL ABLATION LIVER;  Surgeon: Aletta Edouard, MD;  Location: [facility];  Service: Radiology;  Laterality: N/A;  . SKIN CANCER EXCISION     "face"  . thyroid treatment     radioactive iodine treatment     No Known Allergies    Family History  Problem Relation Age of Onset  . Dementia Sister   . Dementia Sister   . Alzheimer's disease Sister   . Liver disease Brother   . Cancer Other        Colon Cancer, Prostate Cancer-Other Blood Relative  . Hyperlipidemia Other        Other Blood Relative  . Stroke Other        Parent, Other Blood Relative  . Diabetes Other        Parent, Other Blood Relative  . Hypertension Other        Parent  . Prostate cancer Maternal Aunt   . Dementia Maternal Aunt      Social History Ms. Wolpert reports that she has never smoked. She has never used smokeless tobacco. Ms. Bignell reports that she does not drink alcohol.   Review of Systems CONSTITUTIONAL: No weight loss, fever, chills, weakness or fatigue.  HEENT: Eyes: No visual loss, blurred vision, double vision or yellow sclerae.No hearing loss, sneezing, congestion, runny nose or sore throat.  SKIN: No rash or itching.  CARDIOVASCULAR: per hpi RESPIRATORY: No shortness of breath, cough or sputum.  GASTROINTESTINAL: No anorexia, nausea, vomiting or diarrhea. No abdominal pain or blood.  GENITOURINARY: No burning on urination, no polyuria NEUROLOGICAL: No headache, dizziness, syncope, paralysis, ataxia, numbness or tingling in the extremities. No change in bowel or bladder control.  MUSCULOSKELETAL: No muscle, back pain, joint pain or  stiffness.  LYMPHATICS: No enlarged nodes. No history of splenectomy.  PSYCHIATRIC: No history of depression or anxiety.  ENDOCRINOLOGIC: No reports of sweating, cold or heat intolerance. No polyuria or polydipsia.  Marland Kitchen   Physical Examination Vitals:   01/18/18 1331  BP: 138/72  Pulse: 77  SpO2: 98%   Vitals:   01/18/18 1331  Weight: 198 lb 6.4 oz (90 kg)  Height: 5' 2"  (1.575 m)    Gen: resting comfortably, no acute distress HEENT: no scleral icterus, pupils equal round and reactive, no palptable cervical adenopathy,  CV: RRR, no m/r/g, no jvd Resp: Clear to auscultation bilaterally GI: abdomen is soft, non-tender, non-distended, normal bowel sounds, no hepatosplenomegaly MSK: extremities are warm, no edema.  Skin:  warm, no rash Neuro:  no focal deficits Psych: appropriate affect   Diagnostic Studies 10/30/13 Echo Study Conclusions  - Left ventricle: The cavity size was normal. Wall thickness was normal. Systolic function was normal. The estimated ejection fraction was in the range of 55% to 60%. Wall motion was normal; there were no regional wall motion abnormalities. Features are consistent with a pseudonormal left ventricular filling pattern, with concomitant abnormal relaxation and increased filling pressure (grade 2 diastolic dysfunction). - Aortic valve: Mildly to moderately calcified annulus. Trileaflet; mildly calcified leaflets. There was no significant regurgitation. - Mitral valve: Moderately calcified annulus. There was mild regurgitation. - Left atrium: The atrium was moderately to severely dilated. - Right ventricle: The cavity size was mildly dilated. - Right atrium: The atrium was moderately dilated. - Tricuspid valve: There was trivial regurgitation. - Pulmonary arteries: Systolic pressure could not be accurately estimated. - Inferior vena cava: Not visualized. Unable to estimate CVP. - Pericardium, extracardiac: There was no pericardial  effusion.  Impressions:  - Overall normal LV wall thickness and chamber size with LVEF 55-60%. Grade 2 diastolic dysfunction. Moderate to severe left atrial enlargement. Moderately calcified mitral annulus with mild mitral regurgitation. Mildly sclerotic aortic valve. Mildly dilated RV with normal contraction. Moderate right atrial enlargement. Trivial tricuspid regurgitation, unable to assess PASP or CVP.  10/2013 PFTs: moderate obstruction     Assessment and Plan  1. Chronic diastolic heart failure - fluid status affected by her diastolic HF and her chronic liver disease -appears euvolemic today. Request labs from pcp - continue current diuretics.   2. HTN - mildly elevated, reasonable to continue current regimen and continue to monitor at this time   F/u 6 months     Arnoldo Lenis, M.D.

## 2018-01-19 ENCOUNTER — Encounter: Payer: Self-pay | Admitting: *Deleted

## 2018-01-25 DIAGNOSIS — E1151 Type 2 diabetes mellitus with diabetic peripheral angiopathy without gangrene: Secondary | ICD-10-CM | POA: Diagnosis not present

## 2018-01-25 DIAGNOSIS — L600 Ingrowing nail: Secondary | ICD-10-CM | POA: Diagnosis not present

## 2018-01-25 DIAGNOSIS — M79671 Pain in right foot: Secondary | ICD-10-CM | POA: Diagnosis not present

## 2018-01-25 DIAGNOSIS — M79674 Pain in right toe(s): Secondary | ICD-10-CM | POA: Diagnosis not present

## 2018-01-25 DIAGNOSIS — L03032 Cellulitis of left toe: Secondary | ICD-10-CM | POA: Diagnosis not present

## 2018-01-25 DIAGNOSIS — M79672 Pain in left foot: Secondary | ICD-10-CM | POA: Diagnosis not present

## 2018-01-25 DIAGNOSIS — M79675 Pain in left toe(s): Secondary | ICD-10-CM | POA: Diagnosis not present

## 2018-01-25 DIAGNOSIS — E1140 Type 2 diabetes mellitus with diabetic neuropathy, unspecified: Secondary | ICD-10-CM | POA: Diagnosis not present

## 2018-01-25 DIAGNOSIS — L03031 Cellulitis of right toe: Secondary | ICD-10-CM | POA: Diagnosis not present

## 2018-02-01 DIAGNOSIS — C44629 Squamous cell carcinoma of skin of left upper limb, including shoulder: Secondary | ICD-10-CM | POA: Diagnosis not present

## 2018-02-01 DIAGNOSIS — C44622 Squamous cell carcinoma of skin of right upper limb, including shoulder: Secondary | ICD-10-CM | POA: Diagnosis not present

## 2018-03-06 ENCOUNTER — Encounter (INDEPENDENT_AMBULATORY_CARE_PROVIDER_SITE_OTHER): Payer: Self-pay | Admitting: Internal Medicine

## 2018-03-06 ENCOUNTER — Ambulatory Visit (INDEPENDENT_AMBULATORY_CARE_PROVIDER_SITE_OTHER): Payer: Medicare Other | Admitting: Internal Medicine

## 2018-03-06 VITALS — BP 144/82 | HR 69 | Temp 97.5°F | Resp 18 | Ht 62.0 in | Wt 202.4 lb

## 2018-03-06 DIAGNOSIS — C220 Liver cell carcinoma: Secondary | ICD-10-CM | POA: Diagnosis not present

## 2018-03-06 DIAGNOSIS — K7682 Hepatic encephalopathy: Secondary | ICD-10-CM

## 2018-03-06 DIAGNOSIS — K7290 Hepatic failure, unspecified without coma: Secondary | ICD-10-CM

## 2018-03-06 DIAGNOSIS — K7460 Unspecified cirrhosis of liver: Secondary | ICD-10-CM

## 2018-03-06 NOTE — Patient Instructions (Addendum)
Physician will call with results of blood test when completed. Please call office if you weight drops below 190 pounds.

## 2018-03-06 NOTE — Progress Notes (Signed)
Presenting complaint;  Follow-up for cirrhosis, hepatocellular carcinoma and hepatic encephalopathy.  Database and subjective:  Patient is 78 year old Caucasian female who was cirrhosis secondary to NASH complicated by hepatocellular carcinoma which was picked up on routine testing and she underwent thermal ablation in January 2018.  She was retreated in March 2019.  Second procedure was complicated by large right-sided pleural effusion requiring hospitalization and thoracenteses.  Fall of last year she developed confusion and was diagnosed with hepatic encephalopathy. She had ultrasound in October 2019 and liver lesion measured 2.2 cm in was deemed to be stable compared to prior study of March 2019.  Patient was last seen on 11/20/2017.  She says she was hospitalized in [place] and diagnosed with MI.  She states she had mild MRI.  She did not undergo any invasive procedures but her medications were changed.  She has not had any chest pain or shortness of breath.  She is now on furosemide for fluid retention.  She feels torsemide has worked better.  She wants to stay on furosemide for few more days and see what happens.  She has lost 1 pound since her last visit according to our scale.  She states week before last she weighed 194 pounds and today she weighed 200 pounds on her scale. She denies shortness of breath or chest pain.  She also denies abdominal pain pruritus melena or rectal bleeding.  She generally has 3-4 bowel movements per day.  She now has medical alert. Her niece Vaughan Basta who has a power of attorney was not able to accompany her today. Patient lives alone.  She is driving locally.  She does usual household work but she does not do anything strenuous.  She wants to know if she should undergo EGD to to evaluate and treat esophageal varices.   Current Medications: Outpatient Encounter Medications as of 03/06/2018  Medication Sig  . ALPRAZolam (XANAX) 0.25 MG tablet Take 0.25  mg by mouth 2 (two) times daily as needed for anxiety.   Marland Kitchen atorvastatin (LIPITOR) 10 MG tablet Take 10 mg by mouth daily.  . Insulin Glargine (LANTUS SOLOSTAR) 100 UNIT/ML Solostar Pen Inject 25 Units into the skin every evening. Supper time.  . isosorbide dinitrate (ISORDIL) 10 MG tablet Take 10 mg by mouth 2 (two) times daily.  Marland Kitchen lactulose, encephalopathy, (GENERLAC) 10 GM/15ML SOLN Take 30 g by mouth 3 (three) times daily.  . metoprolol tartrate (LOPRESSOR) 25 MG tablet Take 12.5 mg by mouth 2 (two) times daily.  . pantoprazole (PROTONIX) 40 MG tablet TAKE 1 TABLET BY MOUTH EVERY DAY (Patient taking differently: Take 40 mg by mouth once a day in the evening)  . spironolactone (ALDACTONE) 25 MG tablet Take 25 mg by mouth 2 (two) times daily.  Marland Kitchen torsemide (DEMADEX) 20 MG tablet Take 20 mg by mouth 2 (two) times daily.   No facility-administered encounter medications on file as of 03/06/2018.      Objective: Blood pressure (!) 144/82, pulse 69, temperature (!) 97.5 F (36.4 C), temperature source Oral, resp. rate 18, height 5' 2"  (1.575 m), weight 202 lb 6.4 oz (91.8 kg). Patient is alert and in no acute distress. She does not have asterixis.  She has mild tremors. Conjunctiva is pink. Sclera is nonicteric Oropharyngeal mucosa is normal. No neck masses or thyromegaly noted. Cardiac exam with regular rhythm normal S1 and S2. No murmur or gallop noted. Lungs are clear to auscultation. Abdomen is obese but soft and nontender.  Difficult to palpate  liver or spleen.  Flanks are not bulging. No LE edema or clubbing noted.  Labs/studies Results:  Alpha-fetoprotein was 558 on 10/11/2017  Ultrasound on 11/27/2017 revealed Cirrhotic liver with 2.2 cm hyperechoic focus within the left lobe unchanged from prior study of March 2019.  She also had cholelithiasis and small right pleural effusion.   Assessment:  #1.  Hepatocellular carcinoma.  She has undergone thermal ablation on 2 occasions.   Initial therapy was in January 2018 and second 1 was in March, 2019.  This procedure was complicated by large pleural effusion requiring thoracenteses.  If there is evidence of enlarging lesion she may be a candidate for chemotherapy.   #2.  Hepatic encephalopathy.  She is doing well with lactulose.  #3. history of esophageal varices.  Last EGD was in November 2017 given history of recent MI I would recommend waiting at least 4 months before EGD considered.  Plan:  Patient will go to the lab for serum ammonia metabolic 7 and alpha-fetoprotein. Next liver ultrasound would be in April 2020 prior to her next visit. Office visit in 3 months.

## 2018-03-07 LAB — AFP TUMOR MARKER: AFP-Tumor Marker: 1095.9 ng/mL — ABNORMAL HIGH

## 2018-03-07 LAB — BASIC METABOLIC PANEL
BUN/Creatinine Ratio: 12 (calc) (ref 6–22)
BUN: 13 mg/dL (ref 7–25)
CO2: 25 mmol/L (ref 20–32)
Calcium: 8.6 mg/dL (ref 8.6–10.4)
Chloride: 104 mmol/L (ref 98–110)
Creat: 1.05 mg/dL — ABNORMAL HIGH (ref 0.60–0.88)
Glucose, Bld: 196 mg/dL — ABNORMAL HIGH (ref 65–139)
Potassium: 4.1 mmol/L (ref 3.5–5.3)
Sodium: 137 mmol/L (ref 135–146)

## 2018-03-07 LAB — AMMONIA: Ammonia: 53 umol/L (ref <=72)

## 2018-03-12 ENCOUNTER — Other Ambulatory Visit (INDEPENDENT_AMBULATORY_CARE_PROVIDER_SITE_OTHER): Payer: Self-pay | Admitting: *Deleted

## 2018-03-12 DIAGNOSIS — R772 Abnormality of alphafetoprotein: Secondary | ICD-10-CM

## 2018-03-12 DIAGNOSIS — K7460 Unspecified cirrhosis of liver: Secondary | ICD-10-CM

## 2018-03-12 DIAGNOSIS — C220 Liver cell carcinoma: Secondary | ICD-10-CM

## 2018-03-17 ENCOUNTER — Ambulatory Visit (HOSPITAL_COMMUNITY)
Admission: RE | Admit: 2018-03-17 | Discharge: 2018-03-17 | Disposition: A | Discharge status: Home / Self Care | Payer: Medicare Other | Source: Ambulatory Visit | Attending: Internal Medicine | Admitting: Internal Medicine

## 2018-03-17 DIAGNOSIS — R772 Abnormality of alphafetoprotein: Secondary | ICD-10-CM

## 2018-03-17 DIAGNOSIS — C220 Liver cell carcinoma: Secondary | ICD-10-CM | POA: Insufficient documentation

## 2018-03-17 DIAGNOSIS — K7460 Unspecified cirrhosis of liver: Secondary | ICD-10-CM

## 2018-03-17 MED ORDER — GADOXETATE DISODIUM 0.25 MMOL/ML IV SOLN
10.0000 mL | Freq: Once | INTRAVENOUS | Status: AC | PRN
  Administered 2018-03-17: 10 mL via INTRAVENOUS

## 2018-03-21 ENCOUNTER — Other Ambulatory Visit: Payer: Self-pay | Admitting: Interventional Radiology

## 2018-03-21 DIAGNOSIS — C220 Liver cell carcinoma: Secondary | ICD-10-CM

## 2018-03-29 ENCOUNTER — Other Ambulatory Visit: Payer: Self-pay | Admitting: Hematology

## 2018-03-29 ENCOUNTER — Encounter: Payer: Self-pay | Admitting: Radiology

## 2018-03-29 ENCOUNTER — Ambulatory Visit
Admission: RE | Admit: 2018-03-29 | Discharge: 2018-03-29 | Disposition: A | Discharge status: Home / Self Care | Payer: Medicare Other | Source: Ambulatory Visit | Attending: Interventional Radiology | Admitting: Interventional Radiology

## 2018-03-29 DIAGNOSIS — C220 Liver cell carcinoma: Secondary | ICD-10-CM

## 2018-03-29 HISTORY — PX: IR RADIOLOGIST EVAL & MGMT: IMG5224

## 2018-03-30 ENCOUNTER — Other Ambulatory Visit (INDEPENDENT_AMBULATORY_CARE_PROVIDER_SITE_OTHER): Payer: Self-pay | Admitting: Internal Medicine

## 2018-04-05 NOTE — Progress Notes (Signed)
Chief Complaint: History of hepatocellular carcinoma.  History of Present Illness: Cheryl Velasquez is a 78 y.o. female with a history of hepatocellular carcinoma and status post prior thermal ablation of a segment IV carcinoma on 03/11/2016 and second ablation procedure to treat marginal recurrence on 04/26/2017.  She decided not to pursue a follow-up MRI study last July.  More recently, AFP level has risen significantly from 45.9 on 10/04/2016 to 558 on 09/23/2017 to 1,096 on 03/06/2018. MRI on 03/17/2018 demonstrates marked progression of disease in the liver with significant regrowth around the area of prior ablation over a region measuring approximately 8 cm and 5 additional enhancing lesions in both left and right lobes of the liver.  She remains somewhat frail but is functional and still drives. She denies any abdominal pain.  Past Medical History:  Diagnosis Date  . Arthritis    "fingers, left knee" (05/03/2017)  . Cancer of right breast ([place]) 2005  . Dyspnea    shortness of breath with any exertion  . GERD (gastroesophageal reflux disease)   . Hepatocellular carcinoma ([place])    segment 4 hepatocellular carcinoma/notes 04/26/2017  . History of chicken pox   . History of transfusion of platelets    "w/liver treatments"  . Hyperlipidemia   . Hypertension   . Hypothyroidism   . Liver cirrhosis secondary to NASH ([place])    Archie Endo 04/26/2017  . Thyroid disease   . Type II diabetes mellitus ([place])     Past Surgical History:  Procedure Laterality Date  . ABDOMINAL HYSTERECTOMY  1987  . BREAST BIOPSY Right 2005  . BREAST LUMPECTOMY Right 2005  . CATARACT EXTRACTION W/ INTRAOCULAR LENS  IMPLANT, BILATERAL Bilateral   . ESOPHAGEAL BANDING N/A 12/13/2012   Procedure: ESOPHAGEAL BANDING;  Surgeon: Rogene Houston, MD;  [facility];  Service: Endoscopy;  Laterality: N/A;  . ESOPHAGOGASTRODUODENOSCOPY N/A 09/20/2012   Procedure: ESOPHAGOGASTRODUODENOSCOPY (EGD);  Surgeon: Rogene Houston, MD;  [facility];  Service: Endoscopy;  Laterality: N/A;  125-moved to 12:00 Ann notified pt  . ESOPHAGOGASTRODUODENOSCOPY N/A 12/13/2012   Procedure: ESOPHAGOGASTRODUODENOSCOPY (EGD);  Surgeon: Rogene Houston, MD;  [facility];  Service: Endoscopy;  Laterality: N/A;  155-moved to 220 Ann to notify pt  . ESOPHAGOGASTRODUODENOSCOPY N/A 01/11/2016   Procedure: ESOPHAGOGASTRODUODENOSCOPY (EGD);  Surgeon: Rogene Houston, MD;  [facility];  Service: Endoscopy;  Laterality: N/A;  7:30  . HERNIA REPAIR  2004   "belly button"  . IR GENERIC HISTORICAL  01/19/2016   IR RADIOLOGIST EVAL & MGMT 01/19/2016 Aletta Edouard, MD [facility] INTERV RAD  . IR GENERIC HISTORICAL  03/29/2016   IR RADIOLOGIST EVAL & MGMT 03/29/2016 Aletta Edouard, MD [facility] INTER[facility] RAD  . IR RADIOLOGIST EVAL & MGMT  06/14/2016  . IR RADIOLOGIST EVAL & MGMT  11/24/2016  . IR RADIOLOGIST EVAL & MGMT  12/13/2016  . IR RADIOLOGIST EVAL & MGMT  03/15/2017  . IR RADIOLOGIST EVAL & MGMT  06/08/2017  . IR RADIOLOGIST EVAL & MGMT  03/29/2018  . IR RADIOLOGY PERIPHERAL GUIDED IV START  01/18/2017  . IR THORACENTESIS ASP PLEURAL SPACE W/IMG GUIDE  05/23/2017  . IR US GUIDE VASC ACCESS LEFT  01/18/2017  . RADIOFREQUENCY ABLATION N/A 03/11/2016   Procedure: LIVER THERMAL ABLATION;  Surgeon: Aletta Edouard, MD;  Location: [facility];  Service: Anesthesiology;  Laterality: N/A;  . RADIOLOGY WITH ANESTHESIA N/A 04/26/2017   Procedure: CT MICROWAVE THERMAL ABLATION LIVER;  Surgeon: Aletta Edouard, MD;  Location: [facility];  Service: Radiology;  Laterality: N/A;  . SKIN CANCER EXCISION     "face"  . thyroid treatment     radioactive iodine treatment    Allergies: Patient has no known allergies.  Medications: Prior to Admission medications   Medication Sig Start Date End Date Taking? Authorizing Provider  ALPRAZolam (XANAX) 0.25 MG tablet Take 0.25 mg by mouth 2 (two) times daily as needed for anxiety.     [provider]  atorvastatin (LIPITOR) 10 MG tablet Take 10 mg by mouth daily.    [provider]  Insulin Glargine (LANTUS SOLOSTAR) 100 UNIT/ML Solostar Pen Inject 25 Units into the skin every evening. Supper time.    [provider]  isosorbide dinitrate (ISORDIL) 10 MG tablet Take 10 mg by mouth 2 (two) times daily.    [provider]  lactulose, encephalopathy, (GENERLAC) 10 GM/15ML SOLN Take 30 g by mouth 3 (three) times daily.    [provider]  metoprolol tartrate (LOPRESSOR) 25 MG tablet Take 12.5 mg by mouth 2 (two) times daily.    [provider]  pantoprazole (PROTONIX) 40 MG tablet TAKE 1 TABLET BY MOUTH EVERY DAY 03/30/18   Rehman, Mechele Dawley, MD  spironolactone (ALDACTONE) 25 MG tablet Take 25 mg by mouth 2 (two) times daily.    [provider]  torsemide (DEMADEX) 20 MG tablet Take 20 mg by mouth 2 (two) times daily.    [provider]     Family History  Problem Relation Age of Onset  . Dementia Sister   . Dementia Sister   . Alzheimer's disease Sister   . Liver disease Brother   . Cancer Other        Colon Cancer, Prostate Cancer-Other Blood Relative  . Hyperlipidemia Other        Other Blood Relative  . Stroke Other        Parent, Other Blood Relative  . Diabetes Other        Parent, Other Blood Relative  . Hypertension Other        Parent  . Prostate cancer Maternal Aunt   . Dementia Maternal Aunt     Social History   Socioeconomic History  . Marital status: Single    Spouse name: Not on file  . Number of children: 0  . Years of education: 10  . Highest education level: Not on file  Occupational History  . Occupation: Copy  . Financial resource strain: Not on file  . Food insecurity:    Worry: Not on file    Inability: Not on file  . Transportation needs:    Medical: Not on file    Non-medical: Not on file  Tobacco Use  . Smoking status: Never Smoker  . Smokeless  tobacco: Never Used  Substance and Sexual Activity  . Alcohol use: No    Alcohol/week: 0.0 standard drinks  . Drug use: No  . Sexual activity: Never  Lifestyle  . Physical activity:    Days per week: Not on file    Minutes per session: Not on file  . Stress: Not on file  Relationships  . Social connections:    Talks on phone: Not on file    Gets together: Not on file    Attends religious service: Not on file    Active member of club or organization: Not on file    Attends meetings of clubs or  organizations: Not on file    Relationship status: Not on file  Other Topics Concern  . Not on file  Social History Narrative   Regular Exercise-no    ECOG Status: 2 - Symptomatic, <50% confined to bed  Review of Systems: A 12 point ROS discussed and pertinent positives are indicated in the HPI above.  All other systems are negative.  Review of Systems  Constitutional: Positive for fatigue.  HENT: Negative.   Respiratory: Negative.   Cardiovascular: Negative.   Gastrointestinal: Negative.   Genitourinary: Negative.   Musculoskeletal: Negative.   Neurological: Negative.     Vital Signs: BP (!) 133/54   Pulse 78   Temp 98.3 F (36.8 C) (Oral)   Resp 20   Ht _0  (1.575 m)   Wt 91.6 kg   SpO2 96%   BMI 36.95 kg/m   Physical Exam Vitals signs reviewed.  Constitutional:      General: She is not in acute distress.    Appearance: She is obese. She is not diaphoretic.  Abdominal:     Tenderness: There is no abdominal tenderness. There is no guarding or rebound.  Musculoskeletal:        General: No swelling.  Neurological:     Mental Status: She is alert and oriented to person, place, and time.     Imaging: Mr Liver W Wo Contrast  Result Date: 03/17/2018 CLINICAL DATA:  Cirrhosis secondary to [employer]. Hepatocellular carcinoma and status post ablation in 2018. Patient was re-treated in March of 2019. EXAM: MRI ABDOMEN WITHOUT AND WITH CONTRAST TECHNIQUE: Multiplanar  multisequence MR imaging of the abdomen was performed both before and after the administration of intravenous contrast. CONTRAST:  37m EOVIST GADOXETATE DISODIUM 0.25 MOL/L IV SOLN COMPARISON:  01/18/2017 FINDINGS: Lower chest: Moderate right pleural effusion with right lower lobe collapse/consolidation. Hepatobiliary: Cirrhotic liver morphology again noted. Segment IV ablation defect again identified. On today's study, there is a large area of hyperenhancement surrounding the ablation defect measuring at least 8.3 x 7.1 cm (image 34/series 11). This area shows washout below background liver parenchyma (see image 31/series 15). Imaging features highly suspicious for recurrent hepatocellular carcinoma. More than 5 additional lesions are identified in the liver with arterial phase hyperenhancement. The largest of these is a 2 cm lesion in the anterior right liver (seen on arterial postcontrast image 41 of series 11). The additional lesions range in size from 6 mm to 14 mm and are noted in the lateral segment left liver (39/11), right hepatic dome (26/11), subcapsular posterior right liver (42/11), inferior right liver (47/11). The 2 cm lesion in the anterior right liver shows washout on more delayed images. The remaining lesions do not show definite washout greater than background liver parenchyma. Several tiny hypervascular foci are seen in the lateral segment left liver. There is no evidence for gallstones, gallbladder wall thickening, or pericholecystic fluid. No intrahepatic or extrahepatic biliary dilation. Pancreas: Pancreas is diffusely atrophic without dilatation of the main duct. Spleen:  Spleen is enlarged. Adrenals/Urinary Tract: No adrenal nodule or mass. Complex cyst noted lower pole right kidney, incompletely visualized. Stomach/Bowel: Stomach is unremarkable. No gastric wall thickening. No evidence of outlet obstruction. Duodenum is normally positioned as is the ligament of Treitz. No small bowel or  colonic dilatation within the visualized abdomen. Vascular/Lymphatic: No abdominal aortic aneurysm. 2.4 x 1.4 cm juxta cardiac lymph node identified anteriorly on 23/13. Portal vein and superior mesenteric vein are patent. Splenic vein appears prominent period. Other:  Small  volume intraperitoneal free fluid. Musculoskeletal: No unexpected or abnormal marrow enhancement within the visualized bony anatomy. IMPRESSION: 1. Markedly motion degraded study due to patient inability to reproducibly breath hold. 2. Large hypervascular heterogeneous lesion surrounding the segment IV ablation defect shows heterogeneous arterial phase hyperenhancement with washout on delayed phase imaging. Imaging features highly suspicious for recurrent hepatocellular carcinoma occupying a large portion of segment IV. 3. More than 5 additional lesions identified in both hepatic lobes today with arterial phase hyperenhancement. The largest of these measures 2 cm and shows washout although no discrete capsule is evident. This lesion would be compatible with a Li-RADS 4 category. The additional lesions described above are compatible with LI-RADS 3. Multifocal hepatocellular carcinoma or metastatic disease from primary lesion are considerations. 4. 2.4 X 1.4 CM juxta cardiac lymph node. Metastatic disease a concern. 5. Splenomegaly with paraesophageal varices suggests portal venous hypertension. 6. Moderate right pleural effusion with right base collapse/consolidation. Electronically Signed   By: Misty Stanley M.D.   On: 03/17/2018 16:33   Ir Radiologist Eval & Mgmt  Result Date: 03/29/2018 Please refer to notes tab for details about interventional procedure. (Op Note)   Labs:  CBC: Recent Labs    05/26/17 1336 05/27/17 0250 05/29/17 0348 05/30/17 0502  WBC 4.2 3.1* 3.3* 3.2*  HGB 14.1 12.2 12.6 13.2  HCT 41.0 35.9* 38.2 39.2  PLT 107* 50* 51* 52*    COAGS: Recent Labs    04/20/17 1209 05/27/17 0250  INR 1.24 1.32  APTT  32  --     BMP: Recent Labs    05/28/17 0418 05/29/17 0348 05/30/17 0502 05/31/17 0225 03/06/18 1103  NA 132* 134* 135 132* 137  K 3.7 4.2 4.3 4.4 4.1  CL 91* 93* 94* 93* 104  CO2 _0 GLUCOSE 131* 154* 153* 150* 196*  BUN _1 CALCIUM 7.7* 8.0* 8.6* 8.4* 8.6  CREATININE 0.81 0.82 0.97 0.92 1.05*  GFRNONAA >60 >60 54* 58*  --   GFRAA >60 >60 >60 >60  --     LIVER FUNCTION TESTS: Recent Labs    05/03/17 2122 05/23/17 0717 05/26/17 0413 05/27/17 0250  BILITOT 1.4* 1.3* 1.4* 1.2  AST 48* 47* 40 44*  ALT 34 30 31 32  ALKPHOS 76 65 66 69  PROT 6.8 5.7* 5.1* 5.4*  ALBUMIN 3.0* 2.6* 2.3* 2.4*    TUMOR MARKERS: Recent Labs    03/06/18 1103  AFPTM 1,095.9*    Assessment and Plan:  I met with Cheryl Velasquez and her niece Cheryl Velasquez.  We reviewed the findings from the recent MRI of the abdomen.  Unfortunately this demonstrates significant progression of disease.  In her condition, I do not feel that she would do well with whole liver directed therapy such as Y-90 radioembolization or chemoembolization.  She also is not interested in a procedure that might have a negative impact on her remaining life or result in a possible complication.  She does have an upcoming appointment with Dr. Delton Coombes.  I told her that it would be worth talking to him about whether he felt that there was some type of systemic therapy that she might tolerate such as an immunotherapy agent.   Electronically Signed: Azzie Roup 04/05/2018, 4:58 PM   I spent a total of 15 Minutes in face to face in clinical consultation, greater than 50% of which was counseling/coordinating care for hepatocellular carcinoma.

## 2018-04-10 DIAGNOSIS — J189 Pneumonia, unspecified organism: Secondary | ICD-10-CM | POA: Diagnosis not present

## 2018-04-10 DIAGNOSIS — J101 Influenza due to other identified influenza virus with other respiratory manifestations: Secondary | ICD-10-CM | POA: Diagnosis not present

## 2018-04-10 DIAGNOSIS — R509 Fever, unspecified: Secondary | ICD-10-CM | POA: Diagnosis not present

## 2018-04-10 DIAGNOSIS — Z6835 Body mass index (BMI) 35.0-35.9, adult: Secondary | ICD-10-CM | POA: Diagnosis not present

## 2018-04-19 ENCOUNTER — Other Ambulatory Visit (HOSPITAL_COMMUNITY): Payer: Self-pay

## 2018-04-19 DIAGNOSIS — L03031 Cellulitis of right toe: Secondary | ICD-10-CM | POA: Diagnosis not present

## 2018-04-19 DIAGNOSIS — M79675 Pain in left toe(s): Secondary | ICD-10-CM | POA: Diagnosis not present

## 2018-04-19 DIAGNOSIS — M79674 Pain in right toe(s): Secondary | ICD-10-CM | POA: Diagnosis not present

## 2018-04-19 DIAGNOSIS — C50411 Malignant neoplasm of upper-outer quadrant of right female breast: Secondary | ICD-10-CM

## 2018-04-19 DIAGNOSIS — M79671 Pain in right foot: Secondary | ICD-10-CM | POA: Diagnosis not present

## 2018-04-19 DIAGNOSIS — D6959 Other secondary thrombocytopenia: Secondary | ICD-10-CM

## 2018-04-19 DIAGNOSIS — E538 Deficiency of other specified B group vitamins: Secondary | ICD-10-CM

## 2018-04-19 DIAGNOSIS — L03032 Cellulitis of left toe: Secondary | ICD-10-CM | POA: Diagnosis not present

## 2018-04-19 DIAGNOSIS — L600 Ingrowing nail: Secondary | ICD-10-CM | POA: Diagnosis not present

## 2018-04-19 DIAGNOSIS — E1140 Type 2 diabetes mellitus with diabetic neuropathy, unspecified: Secondary | ICD-10-CM | POA: Diagnosis not present

## 2018-04-19 DIAGNOSIS — E1151 Type 2 diabetes mellitus with diabetic peripheral angiopathy without gangrene: Secondary | ICD-10-CM | POA: Diagnosis not present

## 2018-04-19 DIAGNOSIS — D731 Hypersplenism: Secondary | ICD-10-CM

## 2018-04-19 DIAGNOSIS — D72819 Decreased white blood cell count, unspecified: Secondary | ICD-10-CM

## 2018-04-19 DIAGNOSIS — D500 Iron deficiency anemia secondary to blood loss (chronic): Secondary | ICD-10-CM

## 2018-04-19 DIAGNOSIS — M79672 Pain in left foot: Secondary | ICD-10-CM | POA: Diagnosis not present

## 2018-04-20 ENCOUNTER — Inpatient Hospital Stay (HOSPITAL_COMMUNITY): Payer: Medicare Other | Attending: Hematology

## 2018-04-20 ENCOUNTER — Encounter (HOSPITAL_COMMUNITY): Payer: Self-pay | Admitting: Hematology

## 2018-04-20 ENCOUNTER — Other Ambulatory Visit: Payer: Self-pay

## 2018-04-20 ENCOUNTER — Inpatient Hospital Stay (HOSPITAL_BASED_OUTPATIENT_CLINIC_OR_DEPARTMENT_OTHER): Payer: Medicare Other | Admitting: Hematology

## 2018-04-20 VITALS — BP 131/61 | HR 85 | Temp 98.1°F | Resp 18 | Wt 199.0 lb

## 2018-04-20 DIAGNOSIS — K7460 Unspecified cirrhosis of liver: Secondary | ICD-10-CM | POA: Insufficient documentation

## 2018-04-20 DIAGNOSIS — C220 Liver cell carcinoma: Secondary | ICD-10-CM | POA: Diagnosis not present

## 2018-04-20 DIAGNOSIS — J90 Pleural effusion, not elsewhere classified: Secondary | ICD-10-CM | POA: Diagnosis not present

## 2018-04-20 DIAGNOSIS — D6959 Other secondary thrombocytopenia: Secondary | ICD-10-CM

## 2018-04-20 DIAGNOSIS — Z853 Personal history of malignant neoplasm of breast: Secondary | ICD-10-CM

## 2018-04-20 DIAGNOSIS — Z7189 Other specified counseling: Secondary | ICD-10-CM | POA: Insufficient documentation

## 2018-04-20 DIAGNOSIS — D72819 Decreased white blood cell count, unspecified: Secondary | ICD-10-CM

## 2018-04-20 DIAGNOSIS — E538 Deficiency of other specified B group vitamins: Secondary | ICD-10-CM

## 2018-04-20 DIAGNOSIS — C50411 Malignant neoplasm of upper-outer quadrant of right female breast: Secondary | ICD-10-CM

## 2018-04-20 DIAGNOSIS — D500 Iron deficiency anemia secondary to blood loss (chronic): Secondary | ICD-10-CM

## 2018-04-20 LAB — CBC WITH DIFFERENTIAL/PLATELET
Abs Immature Granulocytes: 0.02 10*3/uL (ref 0.00–0.07)
Basophils Absolute: 0 10*3/uL (ref 0.0–0.1)
Basophils Relative: 0 %
Eosinophils Absolute: 0.1 10*3/uL (ref 0.0–0.5)
Eosinophils Relative: 3 %
HCT: 36.5 % (ref 36.0–46.0)
HEMOGLOBIN: 11.7 g/dL — AB (ref 12.0–15.0)
Immature Granulocytes: 1 %
LYMPHS PCT: 26 %
Lymphs Abs: 0.6 10*3/uL — ABNORMAL LOW (ref 0.7–4.0)
MCH: 28.1 pg (ref 26.0–34.0)
MCHC: 32.1 g/dL (ref 30.0–36.0)
MCV: 87.7 fL (ref 80.0–100.0)
MONO ABS: 0.3 10*3/uL (ref 0.1–1.0)
Monocytes Relative: 11 %
Neutro Abs: 1.5 10*3/uL — ABNORMAL LOW (ref 1.7–7.7)
Neutrophils Relative %: 59 %
Platelets: 59 10*3/uL — ABNORMAL LOW (ref 150–400)
RBC: 4.16 MIL/uL (ref 3.87–5.11)
RDW: 15.1 % (ref 11.5–15.5)
WBC: 2.4 10*3/uL — ABNORMAL LOW (ref 4.0–10.5)
nRBC: 0 % (ref 0.0–0.2)

## 2018-04-20 LAB — COMPREHENSIVE METABOLIC PANEL
ALT: 17 U/L (ref 0–44)
AST: 35 U/L (ref 15–41)
Albumin: 3.2 g/dL — ABNORMAL LOW (ref 3.5–5.0)
Alkaline Phosphatase: 134 U/L — ABNORMAL HIGH (ref 38–126)
Anion gap: 9 (ref 5–15)
BUN: 11 mg/dL (ref 8–23)
CO2: 23 mmol/L (ref 22–32)
Calcium: 8.6 mg/dL — ABNORMAL LOW (ref 8.9–10.3)
Chloride: 103 mmol/L (ref 98–111)
Creatinine, Ser: 1.03 mg/dL — ABNORMAL HIGH (ref 0.44–1.00)
GFR calc Af Amer: 59 mL/min — ABNORMAL LOW (ref >60)
GFR calc non Af Amer: 51 mL/min — ABNORMAL LOW (ref >60)
Glucose, Bld: 176 mg/dL — ABNORMAL HIGH (ref 70–99)
Potassium: 3.6 mmol/L (ref 3.5–5.1)
Sodium: 135 mmol/L (ref 135–145)
Total Bilirubin: 1.6 mg/dL — ABNORMAL HIGH (ref 0.3–1.2)
Total Protein: 7.3 g/dL (ref 6.5–8.1)

## 2018-04-20 NOTE — Patient Instructions (Addendum)
[place] at [facility] Discharge Instructions  You were seen today by Dr. Delton Coombes, he went over how you've been feeling. He went over your recent test results and discussed the findings in detail with you. He would like to get a PET scan to further evaluate the findings to help decide your plan of care. He discussed treatment options with you and what you maybe taking. He we will set you up for a PET scan and we will follow up after to go over your results.   Thank you for choosing [place] at [facility] to provide your oncology and hematology care.  To afford each patient quality time with our provider, please arrive at least 15 minutes before your scheduled appointment time.   If you have a lab appointment with the [facility] please come in thru the  Main Entrance and check in at the main information desk  You need to re-schedule your appointment should you arrive 10 or more minutes late.  We strive to give you quality time with our providers, and arriving late affects you and other patients whose appointments are after yours.  Also, if you no show three or more times for appointments you may be dismissed from the clinic at the providers discretion.     Again, thank you for choosing [facility].  Our hope is that these requests will decrease the amount of time that you wait before being seen by our physicians.       _____________________________________________________________  Should you have questions after your visit to [facility], please contact our office at [phone] between the hours of 8:00 a.m. and 4:30 p.m.  Voicemails left after 4:00 p.m. will not be returned until the following business day.  For prescription refill requests, have your pharmacy contact our office and allow 72 hours.    Cancer Center Support Programs:   > Cancer Support Group  2nd Tuesday of the month 1pm-2pm, Journey Room

## 2018-04-20 NOTE — Assessment & Plan Note (Addendum)
1.  Metastatic hepatocellular carcinoma: - Patient with history of NASH, evolving into [place]. - Thermal ablation on 03/11/2016 followed by second ablation on 04/26/2017 - Second procedure complicated by right pleural effusion and hepatic encephalopathy. -AFP level on 03/06/2018 was 1095. - MRI of the liver on 03/17/2018 shows cirrhotic morphology, largest lesion measuring 8.3 x 7.1 cm, 5 additional lesions identified in the liver.  There is a 2.4 x 1.4 cm juxtacardiac lymph node. - She was seen by Dr. Kathlene Cote and was felt to be not a candidate for local therapy. - Patient seen with her niece today.  Her niece lives in [place], [place].  Patient lives by herself in [place], [place].  She is independent of all ADLs and IADLs.  She drives her car locally.  No recent episodes of hepatic encephalopathy. - We talked about the standard treatments in the first-line setting including lenvatinib.  We also talked about results of a new study [id number] which showed patients treated with Atezolizumab 1200 mg IV and bevacizumab 15 mg/kg every 3 weeks had better 55-monthoverall survival, six-month PFS and patient reported quality of life outcomes.  We discussed about the main side effects noted in the trial including hypertension, proteinuria, elevated liver enzymes and diarrhea.  We also talked about best supportive care in the form of hospice.  Treatment intent is palliative and discussed with the patient. -Patient and her niece would like to give this combination treatment a trial. -We will order baseline AFP level today.  We will also order a baseline PET CT scan to evaluate for metastasis. -She will also require a port placement as she has poor venous access. -I will see her back after the PET CT scan prior to her first treatment.  2.  Cirrhosis and hepatic anticoagulopathy: -She takes lactulose 30 mL twice daily.  She is also on lactulose 40 mg daily and spironolactone 25 mg twice daily.

## 2018-04-20 NOTE — Progress Notes (Signed)
START OFF PATHWAY REGIMEN - Other Dx   [id number]:Atezolizumab 1,200 mg IV D1 + Bevacizumab 15 mg/kg IV D1 q21 Days:   A cycle is every 21 Days:     Atezolizumab      Bevacizumab-xxxx   **Always confirm dose/schedule in your pharmacy ordering system**  Patient Characteristics: Intent of Therapy: Non-Curative / Palliative Intent, Discussed with Patient

## 2018-04-20 NOTE — Progress Notes (Signed)
[place], [place] [address]   CLINIC:  Medical Oncology/Hematology  PCP:  [name], MD [place] [phone]   REASON FOR VISIT:  Follow-up for hepatocellular carcinoma.  CURRENT THERAPY: To discuss initiation of therapy for [place].  BRIEF ONCOLOGIC HISTORY:    Breast cancer of upper-outer quadrant of right female breast ([place])   12/12/2003 Initial Biopsy    T1b tumor, ER+ greater than 90%, PR + 20%, HER 2/neu negative.    12/19/2003 Surgery    Lumpectomy, sentinel node. No metastatic carcinoma in 4 LN. No residual cancer in the biopsy specimen    01/06/2004 - 01/06/2009 Anti-estrogen oral therapy    Arimidex X 5 years    01/19/2004 - 03/22/2004 Radiation Therapy    5040 cGY in 28 fractions delivered with boost to tumor bed totaling 6040 cGY     Hepatocellular carcinoma ([place])   03/11/2016 Initial Diagnosis    Hepatocellular carcinoma ([place])    05/01/2018 -  Chemotherapy    The patient had bevacizumab (AVASTIN) 1,350 mg in sodium chloride 0.9 % 100 mL chemo infusion, 15 mg/kg, Intravenous,  Once, 0 of 6 cycles atezolizumab (TECENTRIQ) 1,200 mg in sodium chloride 0.9 % 250 mL chemo infusion, 1,200 mg, Intravenous, Once, 0 of 6 cycles  for chemotherapy treatment.       CANCER STAGING: Cancer Staging Breast cancer of upper-outer quadrant of right female breast [facility]) Staging form: Breast, AJCC 7th Edition - Clinical: Stage IA (T1b, N0, M0) - Signed by Derek Jack, MD on 04/24/2017    INTERVAL HISTORY:  Ms. Murtaugh 78 y.o. female returns for follow-up of her hepatocellular carcinoma.  She was evaluated by Dr. Kathlene Cote recently and was felt not to be a candidate for local therapy.  She lives by herself at home and is independent of all ADLs and IADLs.  She drives locally.  She is accompanied by her niece today.  She denies any recent encephalopathy episodes.  She is continuing lactulose twice daily.   Appetite and energy levels are 75%.  She denies any pain.  Denies any bleeding per rectum or melena.    REVIEW OF SYSTEMS:  Review of Systems  Constitutional: Positive for fatigue.  All other systems reviewed and are negative.    PAST MEDICAL/SURGICAL HISTORY:  Past Medical History:  Diagnosis Date  . Arthritis    "fingers, left knee" (05/03/2017)  . Cancer of right breast ([place]) 2005  . Dyspnea    shortness of breath with any exertion  . GERD (gastroesophageal reflux disease)   . Hepatocellular carcinoma ([place])    segment 4 hepatocellular carcinoma/notes 04/26/2017  . History of chicken pox   . History of transfusion of platelets    "w/liver treatments"  . Hyperlipidemia   . Hypertension   . Hypothyroidism   . Liver cirrhosis secondary to NASH ([place])    Archie Endo 04/26/2017  . Thyroid disease   . Type II diabetes mellitus ([place])    Past Surgical History:  Procedure Laterality Date  . ABDOMINAL HYSTERECTOMY  1987  . BREAST BIOPSY Right 2005  . BREAST LUMPECTOMY Right 2005  . CATARACT EXTRACTION W/ INTRAOCULAR LENS  IMPLANT, BILATERAL Bilateral   . ESOPHAGEAL BANDING N/A 12/13/2012   Procedure: ESOPHAGEAL BANDING;  Surgeon: Rogene Houston, MD;  [facility];  Service: Endoscopy;  Laterality: N/A;  . ESOPHAGOGASTRODUODENOSCOPY N/A 09/20/2012   Procedure: ESOPHAGOGASTRODUODENOSCOPY (EGD);  Surgeon: Rogene Houston, MD;  [facility];  Service: Endoscopy;  Laterality: N/A;  125-moved to 12:00 Ann notified pt  . ESOPHAGOGASTRODUODENOSCOPY N/A 12/13/2012   Procedure: ESOPHAGOGASTRODUODENOSCOPY (EGD);  Surgeon: Rogene Houston, MD;  [facility];  Service: Endoscopy;  Laterality: N/A;  155-moved to 220 Ann to notify pt  . ESOPHAGOGASTRODUODENOSCOPY N/A 01/11/2016   Procedure: ESOPHAGOGASTRODUODENOSCOPY (EGD);  Surgeon: Rogene Houston, MD;  [facility];  Service: Endoscopy;  Laterality: N/A;  7:30  . HERNIA REPAIR  2004   "belly button"  .  IR GENERIC HISTORICAL  01/19/2016   IR RADIOLOGIST EVAL & MGMT 01/19/2016 Aletta Edouard, MD [facility] INTERV RAD  . IR GENERIC HISTORICAL  03/29/2016   IR RADIOLOGIST EVAL & MGMT 03/29/2016 Aletta Edouard, MD [facility] INTERV RAD  . IR RADIOLOGIST EVAL & MGMT  06/14/2016  . IR RADIOLOGIST EVAL & MGMT  11/24/2016  . IR RADIOLOGIST EVAL & MGMT  12/13/2016  . IR RADIOLOGIST EVAL & MGMT  03/15/2017  . IR RADIOLOGIST EVAL & MGMT  06/08/2017  . IR RADIOLOGIST EVAL & MGMT  03/29/2018  . IR RADIOLOGY PERIPHERAL GUIDED IV START  01/18/2017  . IR THORACENTESIS ASP PLEURAL SPACE W/IMG GUIDE  05/23/2017  . IR US GUIDE VASC ACCESS LEFT  01/18/2017  . RADIOFREQUENCY ABLATION N/A 03/11/2016   Procedure: LIVER THERMAL ABLATION;  Surgeon: Aletta Edouard, MD;  Location: [facility];  Service: Anesthesiology;  Laterality: N/A;  . RADIOLOGY WITH ANESTHESIA N/A 04/26/2017   Procedure: CT MICROWAVE THERMAL ABLATION LIVER;  Surgeon: Aletta Edouard, MD;  Location: [facility];  Service: Radiology;  Laterality: N/A;  . SKIN CANCER EXCISION     "face"  . thyroid treatment     radioactive iodine treatment     SOCIAL HISTORY:  Social History   Socioeconomic History  . Marital status: Single    Spouse name: Not on file  . Number of children: 0  . Years of education: 10  . Highest education level: Not on file  Occupational History  . Occupation: Copy  . Financial resource strain: Not on file  . Food insecurity:    Worry: Not on file    Inability: Not on file  . Transportation needs:    Medical: Not on file    Non-medical: Not on file  Tobacco Use  . Smoking status: Never Smoker  . Smokeless tobacco: Never Used  Substance and Sexual Activity  . Alcohol use: No    Alcohol/week: 0.0 standard drinks  . Drug use: No  . Sexual activity: Never  Lifestyle  . Physical activity:    Days per week: Not on file    Minutes per session: Not on file  . Stress: Not on file  Relationships  . Social connections:     Talks on phone: Not on file    Gets together: Not on file    Attends religious service: Not on file    Active member of club or organization: Not on file    Attends meetings of clubs or organizations: Not on file    Relationship status: Not on file  . Intimate partner violence:    Fear of current or ex partner: Not on file    Emotionally abused: Not on file    Physically abused: Not on file    Forced sexual activity: Not on file  Other Topics Concern  . Not on file  Social History Narrative   Regular Exercise-no    FAMILY HISTORY:  Family  History  Problem Relation Age of Onset  . Dementia Sister   . Dementia Sister   . Alzheimer's disease Sister   . Liver disease Brother   . Cancer Other        Colon Cancer, Prostate Cancer-Other Blood Relative  . Hyperlipidemia Other        Other Blood Relative  . Stroke Other        Parent, Other Blood Relative  . Diabetes Other        Parent, Other Blood Relative  . Hypertension Other        Parent  . Prostate cancer Maternal Aunt   . Dementia Maternal Aunt     CURRENT MEDICATIONS:  Outpatient Encounter Medications as of 04/20/2018  Medication Sig  . ALPRAZolam (XANAX) 0.25 MG tablet Take 0.25 mg by mouth 2 (two) times daily as needed for anxiety.   Marland Kitchen atorvastatin (LIPITOR) 10 MG tablet Take 10 mg by mouth daily.  . B-D UF III MINI PEN NEEDLES 31G X 5 MM MISC See admin instructions.  . furosemide (LASIX) 20 MG tablet Take 40 mg by mouth daily.  . Insulin Glargine (LANTUS SOLOSTAR) 100 UNIT/ML Solostar Pen Inject 25 Units into the skin every evening. Supper time.  . isosorbide dinitrate (ISORDIL) 10 MG tablet Take 10 mg by mouth 2 (two) times daily.  Marland Kitchen lactulose (CHRONULAC) 10 GM/15ML solution TAKE 30MLS BY MOUTH TWICE DAILY  . metoprolol tartrate (LOPRESSOR) 25 MG tablet Take 12.5 mg by mouth 2 (two) times daily.  . pantoprazole (PROTONIX) 40 MG tablet TAKE 1 TABLET BY MOUTH EVERY DAY  . spironolactone (ALDACTONE) 25 MG tablet Take  25 mg by mouth 2 (two) times daily.  . [DISCONTINUED] ALPRAZolam (XANAX) 0.5 MG tablet Take 0.5 mg by mouth 2 (two) times daily as needed.  . [DISCONTINUED] lactulose, encephalopathy, (GENERLAC) 10 GM/15ML SOLN Take 30 g by mouth 3 (three) times daily.  . [DISCONTINUED] torsemide (DEMADEX) 20 MG tablet Take 20 mg by mouth 2 (two) times daily.   No facility-administered encounter medications on file as of 04/20/2018.     ALLERGIES:  No Known Allergies   PHYSICAL EXAM:  ECOG Performance status: 1  Vitals:   04/20/18 0900  BP: 131/61  Pulse: 85  Resp: 18  Temp: 98.1 F (36.7 C)  SpO2: 98%   Filed Weights   04/20/18 0900  Weight: 199 lb (90.3 kg)    Physical Exam Constitutional:      Appearance: Normal appearance.  Cardiovascular:     Rate and Rhythm: Normal rate and regular rhythm.     Heart sounds: Normal heart sounds.  Pulmonary:     Effort: Pulmonary effort is normal.     Breath sounds: Normal breath sounds.  Abdominal:     General: There is distension.     Palpations: Abdomen is soft. There is no mass.  Musculoskeletal:        General: Swelling present.  Skin:    General: Skin is warm.     Findings: No rash.  Neurological:     Mental Status: She is alert and oriented to person, place, and time.  Psychiatric:        Mood and Affect: Mood normal.        Behavior: Behavior normal.      LABORATORY DATA:  I have reviewed the labs as listed.  CBC    Component Value Date/Time   WBC 2.4 (L) 04/20/2018 0824   RBC 4.16 04/20/2018 0824   HGB  11.7 (L) 04/20/2018 0824   HCT 36.5 04/20/2018 0824   PLT 59 (L) 04/20/2018 0824   MCV 87.7 04/20/2018 0824   MCH 28.1 04/20/2018 0824   MCHC 32.1 04/20/2018 0824   RDW 15.1 04/20/2018 0824   LYMPHSABS 0.6 (L) 04/20/2018 0824   MONOABS 0.3 04/20/2018 0824   EOSABS 0.1 04/20/2018 0824   BASOSABS 0.0 04/20/2018 0824   CMP Latest Ref Rng & Units 04/20/2018 03/06/2018 05/31/2017  Glucose 70 - 99 mg/dL 176(H) 196(H) 150(H)    BUN 8 - 23 mg/dL 11 13 18   Creatinine 0.44 - 1.00 mg/dL 1.03(H) 1.05(H) 0.92  Sodium 135 - 145 mmol/L 135 137 132(L)  Potassium 3.5 - 5.1 mmol/L 3.6 4.1 4.4  Chloride 98 - 111 mmol/L 103 104 93(L)  CO2 22 - 32 mmol/L 23 25 28   Calcium 8.9 - 10.3 mg/dL 8.6(L) 8.6 8.4(L)  Total Protein 6.5 - 8.1 g/dL 7.3 - -  Total Bilirubin 0.3 - 1.2 mg/dL 1.6(H) - -  Alkaline Phos 38 - 126 U/L 134(H) - -  AST 15 - 41 U/L 35 - -  ALT 0 - 44 U/L 17 - -       DIAGNOSTIC IMAGING:  I have independently reviewed the scans and discussed with the patient.    ASSESSMENT & PLAN:   Hepatocellular carcinoma ([place]) 1.  Metastatic hepatocellular carcinoma: - Patient with history of NASH, evolving into [place]. - Thermal ablation on 03/11/2016 followed by second ablation on 04/26/2017 - Second procedure complicated by right pleural effusion and hepatic encephalopathy. -AFP level on 03/06/2018 was 1095. - MRI of the liver on 03/17/2018 shows cirrhotic morphology, largest lesion measuring 8.3 x 7.1 cm, 5 additional lesions identified in the liver.  There is a 2.4 x 1.4 cm juxtacardiac lymph node. - She was seen by Dr. Kathlene Cote and was felt to be not a candidate for local therapy. - Patient seen with her niece today.  Her niece lives in [place], [place].  Patient lives by herself in [place], [place].  She is independent of all ADLs and IADLs.  She drives her car locally.  No recent episodes of hepatic encephalopathy. - We talked about the standard treatments in the first-line setting including lenvatinib.  We also talked about results of a new study [id number] which showed patients treated with Atezolizumab 1200 mg IV and bevacizumab 15 mg/kg every 3 weeks had better 58-monthoverall survival, six-month PFS and patient reported quality of life outcomes.  We discussed about the main side effects noted in the trial including hypertension, proteinuria, elevated liver enzymes and diarrhea.  We also talked about best  supportive care in the form of hospice.  Treatment intent is palliative and discussed with the patient. -Patient and her niece would like to give this combination treatment a trial. -We will order baseline AFP level today.  We will also order a baseline PET CT scan to evaluate for metastasis. -She will also require a port placement as she has poor venous access. -I will see her back after the PET CT scan prior to her first treatment.  2.  Cirrhosis and hepatic anticoagulopathy: -She takes lactulose 30 mL twice daily.  She is also on lactulose 40 mg daily and spironolactone 25 mg twice daily.  Total time spent is 40 minutes with more than 50% of the time spent face-to-face discussing scan results, prognosis, treatment options and coordination of care.    Orders placed this encounter:  Orders Placed This Encounter  Procedures  .  NM PET Image Initial (PI) Skull Base To Thigh  . AFP tumor marker      Derek Jack, MD [place] [phone]

## 2018-04-21 LAB — AFP TUMOR MARKER: AFP, Serum, Tumor Marker: 1138 ng/mL — ABNORMAL HIGH (ref 0.0–8.3)

## 2018-04-23 ENCOUNTER — Telehealth (INDEPENDENT_AMBULATORY_CARE_PROVIDER_SITE_OTHER): Payer: Self-pay | Admitting: Internal Medicine

## 2018-04-23 NOTE — Telephone Encounter (Signed)
Patients niece called states she wants to talk to you about patients treatment options - please call Vaughan Basta at [phone]

## 2018-04-24 ENCOUNTER — Encounter: Payer: Self-pay | Admitting: General Surgery

## 2018-04-24 ENCOUNTER — Ambulatory Visit (INDEPENDENT_AMBULATORY_CARE_PROVIDER_SITE_OTHER): Payer: Medicare Other | Admitting: General Surgery

## 2018-04-24 VITALS — BP 126/66 | HR 103 | Temp 97.8°F | Resp 20 | Wt 200.4 lb

## 2018-04-24 DIAGNOSIS — C220 Liver cell carcinoma: Secondary | ICD-10-CM | POA: Diagnosis not present

## 2018-04-24 NOTE — Patient Instructions (Addendum)
Implanted [facility] Guide An implanted port is a device that is placed under the skin. It is usually placed in the chest. The device can be used to give IV medicine, to take blood, or for dialysis. You may have an implanted port if:  You need IV medicine that would be irritating to the small veins in your hands or arms.  You need IV medicines, such as antibiotics, for a long period of time.  You need IV nutrition for a long period of time.  You need dialysis. Having a port means that your health care provider will not need to use the veins in your arms for these procedures. You may have fewer limitations when using a port than you would if you used other types of long-term IVs, and you will likely be able to return to normal activities after your incision heals. An implanted port has two main parts:  Reservoir. The reservoir is the part where a needle is inserted to give medicines or draw blood. The reservoir is round. After it is placed, it appears as a small, raised area under your skin.  Catheter. The catheter is a thin, flexible tube that connects the reservoir to a vein. Medicine that is inserted into the reservoir goes into the catheter and then into the vein. How is my port accessed? To access your port:  A numbing cream may be placed on the skin over the port site.  Your health care provider will put on a mask and sterile gloves.  The skin over your port will be cleaned carefully with a germ-killing soap and allowed to dry.  Your health care provider will gently pinch the port and insert a needle into it.  Your health care provider will check for a blood return to make sure the port is in the vein and is not clogged.  If your port needs to remain accessed to get medicine continuously (constant infusion), your health care provider will place a clear bandage (dressing) over the needle site. The dressing and needle will need to be changed every week, or as told by your health care  provider. What is flushing? Flushing helps keep the port from getting clogged. Follow instructions from your health care provider about how and when to flush the port. Ports are usually flushed with saline solution or a medicine called heparin. The need for flushing will depend on how the port is used:  If the port is only used from time to time to give medicines or draw blood, the port may need to be flushed: ? Before and after medicines have been given. ? Before and after blood has been drawn. ? As part of routine maintenance. Flushing may be recommended every 4-6 weeks.  If a constant infusion is running, the port may not need to be flushed.  Throw away any syringes in a disposal container that is meant for sharp items (sharps container). You can buy a sharps container from a pharmacy, or you can make one by using an empty hard plastic bottle with a cover. How long will my port stay implanted? The port can stay in for as long as your health care provider thinks it is needed. When it is time for the port to come out, a surgery will be done to remove it. The surgery will be similar to the procedure that was done to put the port in. Follow these instructions at home:   Flush your port as told by your health care provider.  If you need an infusion over several days, follow instructions from your health care provider about how to take care of your port site. Make sure you: ? Wash your hands with soap and water before you change your dressing. If soap and water are not available, use alcohol-based hand sanitizer. ? Change your dressing as told by your health care provider. ? Place any used dressings or infusion bags into a plastic bag. Throw that bag in the trash. ? Keep the dressing that covers the needle clean and dry. Do not get it wet. ? Do not use scissors or sharp objects near the tube. ? Keep the tube clamped, unless it is being used.  Check your port site every day for signs of  infection. Check for: ? Redness, swelling, or pain. ? Fluid or blood. ? Pus or a bad smell.  Protect the skin around the port site. ? Avoid wearing bra straps that rub or irritate the site. ? Protect the skin around your port from seat belts. Place a soft pad over your chest if needed.  Bathe or shower as told by your health care provider. The site may get wet as long as you are not actively receiving an infusion.  Return to your normal activities as told by your health care provider. Ask your health care provider what activities are safe for you.  Carry a medical alert card or wear a medical alert bracelet at all times. This will let health care providers know that you have an implanted port in case of an emergency. Get help right away if:  You have redness, swelling, or pain at the port site.  You have fluid or blood coming from your port site.  You have pus or a bad smell coming from the port site.  You have a fever. Summary  Implanted ports are usually placed in the chest for long-term IV access.  Follow instructions from your health care provider about flushing the port and changing bandages (dressings).  Take care of the area around your port by avoiding clothing that puts pressure on the area, and by watching for signs of infection.  Protect the skin around your port from seat belts. Place a soft pad over your chest if needed.  Get help right away if you have a fever or you have redness, swelling, pain, drainage, or a bad smell at the port site. This information is not intended to replace advice given to you by your health care provider. Make sure you discuss any questions you have with your health care provider. Document Released: 01/31/2005 Document Revised: 03/05/2016 Document Reviewed: 03/05/2016 Elsevier Interactive Patient Education  2019 [place] Insertion Implanted port insertion is a procedure to put in a port and catheter. The port is  a device with an injectable disk that can be accessed by your health care provider. The port is connected to a vein in the chest or neck by a small flexible tube (catheter). There are different types of ports. The implanted port may be used as a long-term IV access for:  Medicines, such as chemotherapy.  Fluids.  Liquid nutrition, such as total parenteral nutrition (TPN). When you have a port, this means that your health care provider will not need to use the veins in your arms for these procedures. Tell a health care provider about:  Any allergies you have.  All medicines you are taking, especially blood thinners, as well as any vitamins, herbs, eye drops,  creams, over-the-counter medicines, and steroids.  Any problems you or family members have had with anesthetic medicines.  Any blood disorders you have.  Any surgeries you have had.  Any medical conditions you have or have had, including diabetes or kidney problems.  Whether you are pregnant or may be pregnant. What are the risks? Generally, this is a safe procedure. However, problems may occur, including:  Allergic reactions to medicines or dyes.  Damage to other structures or organs.  Infection.  Damage to the blood vessel, bruising, or bleeding at the puncture site.  Blood clot.  Breakdown of the skin over the port.  A collection of air in the chest that can cause one of the lungs to collapse (pneumothorax). This is rare. What happens before the procedure? Medicines  Ask your health care provider about: ? Changing or stopping your regular medicines. This is especially important if you are taking diabetes medicines or blood thinners. ? Taking medicines such as aspirin and ibuprofen. These medicines can thin your blood. Do not take these medicines unless your health care provider tells you to take them. ? Taking over-the-counter medicines, vitamins, herbs, and supplements. General instructions  Plan to have someone  take you home from the hospital or clinic.  If you will be going home right after the procedure, plan to have someone with you for 24 hours.  You may have blood tests.  Do not use any products that contain nicotine or tobacco for at least 4-6 weeks before the procedure. These products include cigarettes, e-cigarettes, and chewing tobacco. If you need help quitting, ask your health care provider.  Ask your health care provider what steps will be taken to help prevent infection. These may include: ? Removing hair at the surgery site. ? Washing skin with a germ-killing soap. ? Taking antibiotic medicine. What happens during the procedure?   An IV will be inserted into one of your veins.  You will be given one or more of the following: ? A medicine to help you relax (sedative). ? A medicine to numb the area (local anesthetic).  Two small incisions will be made to insert the port. ? One smaller incision will be made in your neck to get access to the vein where the catheter will lie. ? The other incision will be made in the upper chest. This is where the port will lie.  The procedure may be done using continuous X-ray (fluoroscopy) or other imaging tools for guidance.  The port and catheter will be placed. There may be a small, raised area where the port is.  The port will be flushed with a salt solution (saline), and blood will be drawn to make sure that it is working correctly.  The incisions will be closed.  Bandages (dressings) may be placed over the incisions. The procedure may vary among health care providers and hospitals. What happens after the procedure?  Your blood pressure, heart rate, breathing rate, and blood oxygen level will be monitored until you leave the hospital or clinic.  Do not drive for 24 hours if you were given a sedative during your procedure.  You will be given a manufacturer's information card for the type of port that you have. Keep this with  you.  Your port will need to be flushed and checked as told by your health care provider, usually every few weeks.  A chest X-ray will be done to: ? Check the placement of the port. ? Make sure there is no injury  to your lung. Summary  Implanted port insertion is a procedure to put in a port and catheter.  The implanted port is used as a long-term IV access.  The port will need to be flushed and checked as told by your health care provider, usually every few weeks.  Keep your manufacturer's information card with you at all times. This information is not intended to replace advice given to you by your health care provider. Make sure you discuss any questions you have with your health care provider. Document Released: 11/21/2012 Document Revised: 08/29/2017 Document Reviewed: 08/29/2017 Elsevier Interactive Patient Education  [employer].

## 2018-04-24 NOTE — Progress Notes (Signed)
[facility] History and Physical  Reason for Referral: Port a catheter placement  Referring Physician:  Dr. Judithann Velasquez is a 79 y.o. female.  HPI: [name] is an 78 yo who has a history of NASH, hepatocellular carcinoma and has received thermal ablations in the past.  She developed significant complications after her second ablation including a pleural effusion and encephalopathy and was in the hospital for an extended period of time. She is no longer a candidate for ablation per Dr. Kathlene Velasquez.  She had a recent MRI that shows worsening cirrhosis, enlarging area of [place] and nodule involvement.  She is accompanied by her niece today who helps her make decisions.  She is a long time patient of Dr. Laural Golden. She is on lactulose and there has been no recent encephalopathy and no reported ascites per the niece, Cheryl Velasquez.   Currently the patient is unsure if she truly wants to proceed with her port a catheter placement as she is unsure if she wants to receive chemotherapy. She has a history of breast cancer and surgery in the right breast and was told to not use the right arm for any access.  She is reported to be a poor stick and has had issues receiving IV in the past.    She has been thrombocytopenic for some time. Her last INR was trending up but that was in 05/2017 at 1.32.    Past Medical History:  Diagnosis Date  . Arthritis    "fingers, left knee" (05/03/2017)  . Cancer of right breast ([place]) 2005  . Dyspnea    shortness of breath with any exertion  . GERD (gastroesophageal reflux disease)   . Hepatocellular carcinoma ([place])    segment 4 hepatocellular carcinoma/notes 04/26/2017  . History of chicken pox   . History of transfusion of platelets    "w/liver treatments"  . Hyperlipidemia   . Hypertension   . Hypothyroidism   . Liver cirrhosis secondary to NASH ([place])    Cheryl Velasquez 04/26/2017  . Thyroid disease   . Type II diabetes mellitus ([place])     Past  Surgical History:  Procedure Laterality Date  . ABDOMINAL HYSTERECTOMY  1987  . BREAST BIOPSY Right 2005  . BREAST LUMPECTOMY Right 2005  . CATARACT EXTRACTION W/ INTRAOCULAR LENS  IMPLANT, BILATERAL Bilateral   . ESOPHAGEAL BANDING N/A 12/13/2012   Procedure: ESOPHAGEAL BANDING;  Surgeon: Cheryl Houston, Velasquez;  Location: [facility];  Service: Endoscopy;  Laterality: N/A;  . ESOPHAGOGASTRODUODENOSCOPY N/A 09/20/2012   Procedure: ESOPHAGOGASTRODUODENOSCOPY (EGD);  Surgeon: Cheryl Houston, Velasquez;  [facility]: [facility];  Service: Endoscopy;  Laterality: N/A;  125-moved to 12:00 Ann notified pt  . ESOPHAGOGASTRODUODENOSCOPY N/A 12/13/2012   Procedure: ESOPHAGOGASTRODUODENOSCOPY (EGD);  Surgeon: Cheryl Houston, Velasquez;  [facility];  Service: Endoscopy;  Laterality: N/A;  155-moved to 220 Ann to notify pt  . ESOPHAGOGASTRODUODENOSCOPY N/A 01/11/2016   Procedure: ESOPHAGOGASTRODUODENOSCOPY (EGD);  Surgeon: Cheryl Houston, Velasquez;  [facility]: [facility];  Service: Endoscopy;  Laterality: N/A;  7:30  . HERNIA REPAIR  2004   "belly button"  . IR GENERIC HISTORICAL  01/19/2016   IR RADIOLOGIST EVAL & MGMT 01/19/2016 Cheryl Velasquez [facility] INTERV RAD  . IR GENERIC HISTORICAL  03/29/2016   IR RADIOLOGIST EVAL & MGMT 03/29/2016 Cheryl Velasquez [facility] INTERV RAD  . IR RADIOLOGIST EVAL & MGMT  06/14/2016  . IR RADIOLOGIST EVAL & MGMT  11/24/2016  .  IR RADIOLOGIST EVAL & MGMT  12/13/2016  . IR RADIOLOGIST EVAL & MGMT  03/15/2017  . IR RADIOLOGIST EVAL & MGMT  06/08/2017  . IR RADIOLOGIST EVAL & MGMT  03/29/2018  . IR RADIOLOGY PERIPHERAL GUIDED IV START  01/18/2017  . IR THORACENTESIS ASP PLEURAL SPACE W/IMG GUIDE  05/23/2017  . IR US GUIDE VASC ACCESS LEFT  01/18/2017  . RADIOFREQUENCY ABLATION N/A 03/11/2016   Procedure: LIVER THERMAL ABLATION;  Surgeon: Cheryl Velasquez;  Location: [facility];  Service: Anesthesiology;  Laterality: N/A;  . RADIOLOGY WITH ANESTHESIA N/A 04/26/2017   Procedure: CT  MICROWAVE THERMAL ABLATION LIVER;  Surgeon: Cheryl Velasquez;  Location: [facility];  Service: Radiology;  Laterality: N/A;  . SKIN CANCER EXCISION     "face"  . thyroid treatment     radioactive iodine treatment    Family History  Problem Relation Age of Onset  . Dementia Sister   . Dementia Sister   . Alzheimer's disease Sister   . Liver disease Brother   . Cancer Other        Colon Cancer, Prostate Cancer-Other Blood Relative  . Hyperlipidemia Other        Other Blood Relative  . Stroke Other        Parent, Other Blood Relative  . Diabetes Other        Parent, Other Blood Relative  . Hypertension Other        Parent  . Prostate cancer Maternal Aunt   . Dementia Maternal Aunt     Social History   Tobacco Use  . Smoking status: Never Smoker  . Smokeless tobacco: Never Used  Substance Use Topics  . Alcohol use: No    Alcohol/week: 0.0 standard drinks  . Drug use: No    Medications: I have reviewed the patient's current medications. Allergies as of 04/24/2018   No Known Allergies     Medication List       Accurate as of April 24, 2018 11:27 AM. Always use your most recent med list.        ALPRAZolam 0.25 MG tablet Commonly known as:  XANAX Take 0.25 mg by mouth 2 (two) times daily as needed for anxiety.   atorvastatin 10 MG tablet Commonly known as:  LIPITOR Take 10 mg by mouth daily.   B-D UF III MINI PEN NEEDLES 31G X 5 MM Misc Generic drug:  Insulin Pen Needle See admin instructions.   furosemide 20 MG tablet Commonly known as:  LASIX Take 40 mg by mouth daily.   isosorbide dinitrate 10 MG tablet Commonly known as:  ISORDIL Take 10 mg by mouth 2 (two) times daily.   lactulose 10 GM/15ML solution Commonly known as:  CHRONULAC TAKE 30MLS BY MOUTH TWICE DAILY   Lantus SoloStar 100 UNIT/ML Solostar Pen Generic drug:  Insulin Glargine Inject 25 Units into the skin every evening. Supper time.   metoprolol tartrate 25 MG tablet Commonly known  as:  LOPRESSOR Take 12.5 mg by mouth 2 (two) times daily.   pantoprazole 40 MG tablet Commonly known as:  PROTONIX TAKE 1 TABLET BY MOUTH EVERY DAY   spironolactone 25 MG tablet Commonly known as:  ALDACTONE Take 25 mg by mouth 2 (two) times daily.        ROS:  A comprehensive review of systems was negative except for: Respiratory: positive for cough, wheezing and SOB with exertion Cardiovascular: positive for HTN Hematologic/lymphatic: positive for bleeding and easy bruising Neurological: positive for tremors  Blood pressure 126/66, pulse (!) 103, temperature 97.8 F (36.6 C), temperature source Temporal, resp. rate 20, weight 200 lb 6.4 oz (90.9 kg). Physical Exam Vitals signs reviewed.  Constitutional:      Appearance: Normal appearance. She is obese.  HENT:     Head: Normocephalic.     Nose: Nose normal.     Mouth/Throat:     Mouth: Mucous membranes are moist.  Eyes:     Extraocular Movements: Extraocular movements intact.     Pupils: Pupils are equal, round, and reactive to light.  Neck:     Musculoskeletal: Normal range of motion.  Cardiovascular:     Rate and Rhythm: Normal rate.     Heart sounds: Normal heart sounds.  Pulmonary:     Effort: Pulmonary effort is normal.     Breath sounds: Normal breath sounds.  Abdominal:     General: There is distension.     Palpations: Abdomen is soft.     Tenderness: There is no abdominal tenderness.     Comments: Increased fullness in the epigastric region, umbilical area with some protrusion reported to be from prior hernia repair, no umbilicus noted   Musculoskeletal:        General: Swelling present.  Skin:    General: Skin is warm and dry.  Neurological:     General: No focal deficit present.     Mental Status: She is alert and oriented to person, place, and time.  Psychiatric:        Mood and Affect: Mood normal.        Behavior: Behavior normal.        Thought Content: Thought content normal.        Judgment:  Judgment normal.     Results: INR 1.32 05/2017  Platelets 59 04/2018   Assessment & Plan:  Cheryl Velasquez is a 78 y.o. female with [place] and cirrhosis reported issues with bleeding and known thrombocytopenia. She is very unsure if she wants to even pursue the chemotherapy.   I have discussed with the patient the risk of the port a catheter placement including but not limited to bleeding, infection, risk of pneumothorax, need for an internal jugular catheter given her risk of bleeding, and potential need for platelets and clotting factors at the time of the procedure given her thrombocytopenia and likely coagulopathy due to the cirrhosis.  We have discussed that he right breast cancer and history of surgery does limit people using the right arm in patients, but that we have to look at the whole patient and make a risk and benefit assessment. The intention of not using the arm is to prevent lymphedema which is less likely to occur in her if she has not had problems in the last 15 years since her surgery and also less likely if she received a sentinel node. I cannot find documentation of the procedure. We also discussed that with the amount of time she has left to live, per report from the niece Dr. Delton Coombes indicated likely 6 months without therapy and possibly another 6 months with palliative chemotherapy that the risk of longterm effects of lymphedema do not really apply to her.    The patient and niece want to see what the PET scan shows. They also want to possibly "trial the chemotherapy." The patient is worried about side effects and not being able to continue the treatment.    I will reached out to Dr. Delton Coombes and discuss the possibility of using  the right arm, which the patient says has good veins. If this can be used for the "trial of the chemotherapy" then this might be a better option.    If the patient ultimately decides she wants a port or that access is truly not possible anywhere we will  need to get preoperative platelets and INR check to see if we need to given any of these for the procedure.   Cheryl Velasquez expressed that she wanted to talk to Dr. Laural Golden about the patient, and I have reached out to him to discuss this case.   All questions were answered to the satisfaction of the patient and family. They will let us know what they decide.   Virl Cagey 04/24/2018, 11:27 AM

## 2018-04-27 ENCOUNTER — Other Ambulatory Visit: Payer: Self-pay

## 2018-04-27 ENCOUNTER — Encounter (HOSPITAL_COMMUNITY)
Admission: RE | Admit: 2018-04-27 | Discharge: 2018-04-27 | Disposition: A | Discharge status: Home / Self Care | Payer: Medicare Other | Source: Ambulatory Visit | Attending: Hematology | Admitting: Hematology

## 2018-04-27 ENCOUNTER — Encounter (HOSPITAL_COMMUNITY): Payer: Self-pay | Admitting: Hematology & Oncology

## 2018-04-27 DIAGNOSIS — Z79899 Other long term (current) drug therapy: Secondary | ICD-10-CM | POA: Diagnosis not present

## 2018-04-27 DIAGNOSIS — C220 Liver cell carcinoma: Secondary | ICD-10-CM | POA: Diagnosis not present

## 2018-04-27 LAB — GLUCOSE, CAPILLARY: Glucose-Capillary: 162 mg/dL — ABNORMAL HIGH (ref 70–99)

## 2018-04-27 MED ORDER — FLUDEOXYGLUCOSE F - 18 (FDG) INJECTION
11.0000 | Freq: Once | INTRAVENOUS | Status: AC | PRN
  Administered 2018-04-27: 11 via INTRAVENOUS

## 2018-04-27 NOTE — Telephone Encounter (Signed)
I talked with Cheryl Velasquez patient's niece on 04/23/2018. Patient Not to undergo any further therapy but she will make final decision when PET scans done.

## 2018-04-30 ENCOUNTER — Other Ambulatory Visit (HOSPITAL_COMMUNITY): Payer: Medicare Other

## 2018-04-30 ENCOUNTER — Ambulatory Visit (HOSPITAL_COMMUNITY): Payer: Medicare Other | Admitting: Hematology

## 2018-05-01 ENCOUNTER — Encounter (HOSPITAL_COMMUNITY): Payer: Self-pay | Admitting: Hematology

## 2018-05-01 ENCOUNTER — Encounter (HOSPITAL_COMMUNITY): Payer: Self-pay | Admitting: Lab

## 2018-05-01 ENCOUNTER — Other Ambulatory Visit: Payer: Self-pay

## 2018-05-01 ENCOUNTER — Inpatient Hospital Stay (HOSPITAL_BASED_OUTPATIENT_CLINIC_OR_DEPARTMENT_OTHER): Payer: Medicare Other | Admitting: Hematology

## 2018-05-01 ENCOUNTER — Ambulatory Visit (HOSPITAL_COMMUNITY): Payer: Medicare Other

## 2018-05-01 DIAGNOSIS — J90 Pleural effusion, not elsewhere classified: Secondary | ICD-10-CM | POA: Diagnosis not present

## 2018-05-01 DIAGNOSIS — K7460 Unspecified cirrhosis of liver: Secondary | ICD-10-CM | POA: Diagnosis not present

## 2018-05-01 DIAGNOSIS — C220 Liver cell carcinoma: Secondary | ICD-10-CM

## 2018-05-01 DIAGNOSIS — Z853 Personal history of malignant neoplasm of breast: Secondary | ICD-10-CM | POA: Diagnosis not present

## 2018-05-01 NOTE — Patient Instructions (Addendum)
Cheryl Velasquez at [facility] Discharge Instructions  You were seen today by Dr. Delton Coombes. He went over your recent results, everything was as expected except an area on her thyroid gland. He will send in a Hospice referral for you.    Thank you for choosing [place] at [facility] to provide your oncology and hematology care.  To afford each patient quality time with our provider, please arrive at least 15 minutes before your scheduled appointment time.   If you have a lab appointment with the [facility] please come in thru the  Main Entrance and check in at the main information desk  You need to re-schedule your appointment should you arrive 10 or more minutes late.  We strive to give you quality time with our providers, and arriving late affects you and other patients whose appointments are after yours.  Also, if you no show three or more times for appointments you may be dismissed from the clinic at the providers discretion.     Again, thank you for choosing [facility].  Our hope is that these requests will decrease the amount of time that you wait before being seen by our physicians.       _____________________________________________________________  Should you have questions after your visit to [facility], please contact our office at [phone] between the hours of 8:00 a.m. and 4:30 p.m.  Voicemails left after 4:00 p.m. will not be returned until the following business day.  For prescription refill requests, have your pharmacy contact our office and allow 72 hours.    Cancer Center Support Programs:   > Cancer Support Group  2nd Tuesday of the month 1pm-2pm, Journey Room

## 2018-05-01 NOTE — Progress Notes (Unsigned)
Referral sent to [facility].  Records faxed on 3/17

## 2018-05-01 NOTE — Assessment & Plan Note (Signed)
1.  Metastatic hepatocellular carcinoma: - Patient with history of NASH, evolving into [place]. - Thermal ablation on 03/11/2016 followed by second ablation on 04/26/2017 - Second procedure complicated by right pleural effusion and hepatic encephalopathy. -AFP level on 03/06/2018 was 1095. - MRI of the liver on 03/17/2018 shows cirrhotic morphology, largest lesion measuring 8.3 x 7.1 cm, 5 additional lesions identified in the liver.  There is a 2.4 x 1.4 cm juxtacardiac lymph node. - She was seen by Dr. Kathlene Cote and was felt to be not a candidate for local therapy. - We discussed the results of the PET CT scan on 04/27/2018 which showed focal metabolic some of the right lobe of the thyroid, likely thyroid carcinoma.  Multifocal HCC with potential metastatic node in the precordial region.  Moderate pleural effusion. -Her AFP was elevated at 1138. -She did not have a port placed because of the risk associated with the bleeding. - We had a prolonged discussion about therapy with Atezolizumab and bevacizumab versus best supportive care in the form of hospice. - After lengthy discussion, she decided to go with hospice.  We will make a referral to [facility] in [place]. - I will be glad to see her on an as-needed basis.  2.  Cirrhosis and hepatic anticoagulopathy: -She takes lactulose 30 mL twice daily.  She is also on lactulose 40 mg daily and spironolactone 25 mg twice daily.

## 2018-05-01 NOTE — Progress Notes (Signed)
Cheryl Velasquez, [place] [address]   CLINIC:  Medical Oncology/Hematology  PCP:  Cheryl Bis, MD [place] [id number] [phone]   REASON FOR VISIT:  Follow-up for hepatocellular carcinoma.    BRIEF ONCOLOGIC HISTORY:    Breast cancer of upper-outer quadrant of right female breast ([place])   12/12/2003 Initial Biopsy    T1b tumor, ER+ greater than 90%, PR + 20%, HER 2/neu negative.    12/19/2003 Surgery    Lumpectomy, sentinel node. No metastatic carcinoma in 4 LN. No residual cancer in the biopsy specimen    01/06/2004 - 01/06/2009 Anti-estrogen oral therapy    Arimidex X 5 years    01/19/2004 - 03/22/2004 Radiation Therapy    5040 cGY in 28 fractions delivered with boost to tumor bed totaling 6040 cGY     Hepatocellular carcinoma ([place])   03/11/2016 Initial Diagnosis    Hepatocellular carcinoma ([place])    05/01/2018 -  Chemotherapy    The patient had bevacizumab (AVASTIN) 1,350 mg in sodium chloride 0.9 % 100 mL chemo infusion, 15 mg/kg, Intravenous,  Once, 0 of 6 cycles atezolizumab (TECENTRIQ) 1,200 mg in sodium chloride 0.9 % 250 mL chemo infusion, 1,200 mg, Intravenous, Once, 0 of 6 cycles  for chemotherapy treatment.       CANCER STAGING: Cancer Staging Breast cancer of upper-outer quadrant of right female breast ([place]) Staging form: Breast, AJCC 7th Edition - Clinical: Stage IA (T1b, N0, M0) - Signed by Cheryl Jack, MD on 04/24/2017    INTERVAL HISTORY:  Cheryl Velasquez 78 y.o. female returns for routine follow-up. She is here today with niece. She states that she has had to take the radiation pill for her thyroid years ago. She states that Dr. Constance Velasquez doesn't feel like she is a good candidate for a port placement. She states that she is not sure that she wants to get treatment, she doesn't want chemo. She was educated again on the immunotherapy she would be getting. She is concerned about the side effects and fatigue  with this treatment. She was educated on what may happen if she doesn't get any treatment. She would like a referral to Hospice. Denies any nausea, vomiting, or diarrhea. Denies any new pains. Had not noticed any recent bleeding such as epistaxis, hematuria or hematochezia. Denies recent chest pain on exertion,  pre-syncopal episodes, or palpitations. Denies any numbness or tingling in hands or feet. Denies any recent fevers, infections, or recent hospitalizations. Patient reports appetite at 100% and energy level at 50%.   REVIEW OF SYSTEMS:  Review of Systems  Respiratory: Positive for shortness of breath.      PAST MEDICAL/SURGICAL HISTORY:  Past Medical History:  Diagnosis Date  . Arthritis    "fingers, left knee" (05/03/2017)  . Cancer of right breast ([place]) 2005  . Dyspnea    shortness of breath with any exertion  . GERD (gastroesophageal reflux disease)   . Hepatocellular carcinoma ([place])    segment 4 hepatocellular carcinoma/notes 04/26/2017  . History of chicken pox   . History of transfusion of platelets    "w/liver treatments"  . Hyperlipidemia   . Hypertension   . Hypothyroidism   . Liver cirrhosis secondary to NASH ([place])    Archie Endo 04/26/2017  . Thyroid disease   . Type II diabetes mellitus ([place])    Past Surgical History:  Procedure Laterality Date  . ABDOMINAL HYSTERECTOMY  1987  . BREAST BIOPSY Right 2005  .  BREAST LUMPECTOMY Right 2005  . CATARACT EXTRACTION W/ INTRAOCULAR LENS  IMPLANT, BILATERAL Bilateral   . ESOPHAGEAL BANDING N/A 12/13/2012   Procedure: ESOPHAGEAL BANDING;  Surgeon: Rogene Houston, MD;  [facility];  Service: Endoscopy;  Laterality: N/A;  . ESOPHAGOGASTRODUODENOSCOPY N/A 09/20/2012   Procedure: ESOPHAGOGASTRODUODENOSCOPY (EGD);  Surgeon: Rogene Houston, MD;  [facility];  Service: Endoscopy;  Laterality: N/A;  125-moved to 12:00 Ann notified pt  . ESOPHAGOGASTRODUODENOSCOPY N/A 12/13/2012   Procedure:  ESOPHAGOGASTRODUODENOSCOPY (EGD);  Surgeon: Rogene Houston, MD;  [facility];  Service: Endoscopy;  Laterality: N/A;  155-moved to 220 Ann to notify pt  . ESOPHAGOGASTRODUODENOSCOPY N/A 01/11/2016   Procedure: ESOPHAGOGASTRODUODENOSCOPY (EGD);  Surgeon: Rogene Houston, MD;  [facility];  Service: Endoscopy;  Laterality: N/A;  7:30  . HERNIA REPAIR  2004   "belly button"  . IR GENERIC HISTORICAL  01/19/2016   IR RADIOLOGIST EVAL & MGMT 01/19/2016 Aletta Edouard, MD [facility] INTERV RAD  . IR GENERIC HISTORICAL  03/29/2016   IR RADIOLOGIST EVAL & MGMT 03/29/2016 Aletta Edouard, MD [facility] INTER[facility] RAD  . IR RADIOLOGIST EVAL & MGMT  06/14/2016  . IR RADIOLOGIST EVAL & MGMT  11/24/2016  . IR RADIOLOGIST EVAL & MGMT  12/13/2016  . IR RADIOLOGIST EVAL & MGMT  03/15/2017  . IR RADIOLOGIST EVAL & MGMT  06/08/2017  . IR RADIOLOGIST EVAL & MGMT  03/29/2018  . IR RADIOLOGY PERIPHERAL GUIDED IV START  01/18/2017  . IR THORACENTESIS ASP PLEURAL SPACE W/IMG GUIDE  05/23/2017  . IR US GUIDE VASC ACCESS LEFT  01/18/2017  . RADIOFREQUENCY ABLATION N/A 03/11/2016   Procedure: LIVER THERMAL ABLATION;  Surgeon: Aletta Edouard, MD;  Location: [facility];  Service: Anesthesiology;  Laterality: N/A;  . RADIOLOGY WITH ANESTHESIA N/A 04/26/2017   Procedure: CT MICROWAVE THERMAL ABLATION LIVER;  Surgeon: Aletta Edouard, MD;  Location: [facility];  Service: Radiology;  Laterality: N/A;  . SKIN CANCER EXCISION     "face"  . thyroid treatment     radioactive iodine treatment     SOCIAL HISTORY:  Social History   Socioeconomic History  . Marital status: Single    Spouse name: Not on file  . Number of children: 0  . Years of education: 10  . Highest education level: Not on file  Occupational History  . Occupation: Copy  . Financial resource strain: Not on file  . Food insecurity:    Worry: Not on file    Inability: Not on file  . Transportation needs:    Medical: Not on file     Non-medical: Not on file  Tobacco Use  . Smoking status: Never Smoker  . Smokeless tobacco: Never Used  Substance and Sexual Activity  . Alcohol use: No    Alcohol/week: 0.0 standard drinks  . Drug use: No  . Sexual activity: Never  Lifestyle  . Physical activity:    Days per week: Not on file    Minutes per session: Not on file  . Stress: Not on file  Relationships  . Social connections:    Talks on phone: Not on file    Gets together: Not on file    Attends religious service: Not on file    Active member of club or organization: Not on file    Attends meetings of clubs or organizations: Not on file    Relationship status: Not on file  . Intimate partner violence:  Fear of current or ex partner: Not on file    Emotionally abused: Not on file    Physically abused: Not on file    Forced sexual activity: Not on file  Other Topics Concern  . Not on file  Social History Narrative   Regular Exercise-no    FAMILY HISTORY:  Family History  Problem Relation Age of Onset  . Dementia Sister   . Dementia Sister   . Alzheimer's disease Sister   . Liver disease Brother   . Cancer Other        Colon Cancer, Prostate Cancer-Other Blood Relative  . Hyperlipidemia Other        Other Blood Relative  . Stroke Other        Parent, Other Blood Relative  . Diabetes Other        Parent, Other Blood Relative  . Hypertension Other        Parent  . Prostate cancer Maternal Aunt   . Dementia Maternal Aunt     CURRENT MEDICATIONS:  Outpatient Encounter Medications as of 05/01/2018  Medication Sig  . ALPRAZolam (XANAX) 0.25 MG tablet Take 0.25 mg by mouth 2 (two) times daily as needed for anxiety.   Marland Kitchen atorvastatin (LIPITOR) 10 MG tablet Take 10 mg by mouth daily.  . B-D UF III MINI PEN NEEDLES 31G X 5 MM MISC See admin instructions.  . furosemide (LASIX) 20 MG tablet Take 40 mg by mouth daily.  . Insulin Glargine (LANTUS SOLOSTAR) 100 UNIT/ML Solostar Pen Inject 25 Units into the  skin every evening. Supper time.  . isosorbide dinitrate (ISORDIL) 10 MG tablet Take 10 mg by mouth 2 (two) times daily.  Marland Kitchen lactulose (CHRONULAC) 10 GM/15ML solution TAKE 30MLS BY MOUTH TWICE DAILY  . metoprolol tartrate (LOPRESSOR) 25 MG tablet Take 12.5 mg by mouth 2 (two) times daily.  Marland Kitchen nystatin cream (MYCOSTATIN) APPLY 1 APPLICATION TOPICAL TWO TIMES A DAY  . pantoprazole (PROTONIX) 40 MG tablet TAKE 1 TABLET BY MOUTH EVERY DAY  . spironolactone (ALDACTONE) 25 MG tablet Take 25 mg by mouth 2 (two) times daily.   No facility-administered encounter medications on file as of 05/01/2018.     ALLERGIES:  No Known Allergies   PHYSICAL EXAM:  ECOG Performance status: 2  Vitals:   05/01/18 0931  BP: (!) 136/52  Pulse: 78  Resp: 20  Temp: (!) 97.5 F (36.4 C)  SpO2: 97%   Filed Weights   05/01/18 0931  Weight: 204 lb 3.2 oz (92.6 kg)    Physical Exam Constitutional:      Appearance: Normal appearance.  Cardiovascular:     Rate and Rhythm: Normal rate and regular rhythm.     Heart sounds: Normal heart sounds.  Pulmonary:     Effort: Pulmonary effort is normal.     Breath sounds: Normal breath sounds.  Abdominal:     General: Bowel sounds are normal. There is no distension.     Palpations: Abdomen is soft.  Musculoskeletal:        General: No swelling.  Skin:    General: Skin is warm.  Neurological:     General: No focal deficit present.     Mental Status: She is alert and oriented to person, place, and time.  Psychiatric:        Mood and Affect: Mood normal.        Behavior: Behavior normal.      LABORATORY DATA:  I have reviewed the labs as listed.  CBC    Component Value Date/Time   WBC 2.4 (L) 04/20/2018 0824   RBC 4.16 04/20/2018 0824   HGB 11.7 (L) 04/20/2018 0824   HCT 36.5 04/20/2018 0824   PLT 59 (L) 04/20/2018 0824   MCV 87.7 04/20/2018 0824   MCH 28.1 04/20/2018 0824   MCHC 32.1 04/20/2018 0824   RDW 15.1 04/20/2018 0824   LYMPHSABS 0.6 (L)  04/20/2018 0824   MONOABS 0.3 04/20/2018 0824   EOSABS 0.1 04/20/2018 0824   BASOSABS 0.0 04/20/2018 0824   CMP Latest Ref Rng & Units 04/20/2018 03/06/2018 05/31/2017  Glucose 70 - 99 mg/dL 176(H) 196(H) 150(H)  BUN 8 - 23 mg/dL 11 13 18   Creatinine 0.44 - 1.00 mg/dL 1.03(H) 1.05(H) 0.92  Sodium 135 - 145 mmol/L 135 137 132(L)  Potassium 3.5 - 5.1 mmol/L 3.6 4.1 4.4  Chloride 98 - 111 mmol/L 103 104 93(L)  CO2 22 - 32 mmol/L 23 25 28   Calcium 8.9 - 10.3 mg/dL 8.6(L) 8.6 8.4(L)  Total Protein 6.5 - 8.1 g/dL 7.3 - -  Total Bilirubin 0.3 - 1.2 mg/dL 1.6(H) - -  Alkaline Phos 38 - 126 U/L 134(H) - -  AST 15 - 41 U/L 35 - -  ALT 0 - 44 U/L 17 - -       DIAGNOSTIC IMAGING:  I have independently reviewed the scans and discussed with the patient.   I have reviewed Venita Lick LPN's note and agree with the documentation.  I personally performed a face-to-face visit, made revisions and my assessment and plan is as follows.    ASSESSMENT & PLAN:   Hepatocellular carcinoma ([place]) 1.  Metastatic hepatocellular carcinoma: - Patient with history of NASH, evolving into [place]. - Thermal ablation on 03/11/2016 followed by second ablation on 04/26/2017 - Second procedure complicated by right pleural effusion and hepatic encephalopathy. -AFP level on 03/06/2018 was 1095. - MRI of the liver on 03/17/2018 shows cirrhotic morphology, largest lesion measuring 8.3 x 7.1 cm, 5 additional lesions identified in the liver.  There is a 2.4 x 1.4 cm juxtacardiac lymph node. - She was seen by Dr. Kathlene Cote and was felt to be not a candidate for local therapy. - We discussed the results of the PET CT scan on 04/27/2018 which showed focal metabolic some of the right lobe of the thyroid, likely thyroid carcinoma.  Multifocal HCC with potential metastatic node in the precordial region.  Moderate pleural effusion. -Her AFP was elevated at 1138. -She did not have a port placed because of the risk associated with the  bleeding. - We had a prolonged discussion about therapy with Atezolizumab and bevacizumab versus best supportive care in the form of hospice. - After lengthy discussion, she decided to go with hospice.  We will make a referral to [facility] in [place]. - I will be glad to see her on an as-needed basis.  2.  Cirrhosis and hepatic anticoagulopathy: -She takes lactulose 30 mL twice daily.  She is also on lactulose 40 mg daily and spironolactone 25 mg twice daily.  Total time spent is 40 minutes with more than 50% of the time spent face-to-face discussing treatment options versus best supportive care and coordination of care.    Orders placed this encounter:  No orders of the defined types were placed in this encounter.     Cheryl Jack, MD [place] [phone]

## 2018-05-07 DIAGNOSIS — Z853 Personal history of malignant neoplasm of breast: Secondary | ICD-10-CM | POA: Diagnosis not present

## 2018-05-07 DIAGNOSIS — K7469 Other cirrhosis of liver: Secondary | ICD-10-CM | POA: Diagnosis not present

## 2018-05-07 DIAGNOSIS — I10 Essential (primary) hypertension: Secondary | ICD-10-CM | POA: Diagnosis not present

## 2018-05-07 DIAGNOSIS — E039 Hypothyroidism, unspecified: Secondary | ICD-10-CM | POA: Diagnosis not present

## 2018-05-07 DIAGNOSIS — C220 Liver cell carcinoma: Secondary | ICD-10-CM | POA: Diagnosis not present

## 2018-05-07 DIAGNOSIS — J910 Malignant pleural effusion: Secondary | ICD-10-CM | POA: Diagnosis not present

## 2018-05-07 DIAGNOSIS — E119 Type 2 diabetes mellitus without complications: Secondary | ICD-10-CM | POA: Diagnosis not present

## 2018-05-07 DIAGNOSIS — K7581 Nonalcoholic steatohepatitis (NASH): Secondary | ICD-10-CM | POA: Diagnosis not present

## 2018-05-08 DIAGNOSIS — E119 Type 2 diabetes mellitus without complications: Secondary | ICD-10-CM | POA: Diagnosis not present

## 2018-05-08 DIAGNOSIS — K7469 Other cirrhosis of liver: Secondary | ICD-10-CM | POA: Diagnosis not present

## 2018-05-08 DIAGNOSIS — C220 Liver cell carcinoma: Secondary | ICD-10-CM | POA: Diagnosis not present

## 2018-05-08 DIAGNOSIS — K7581 Nonalcoholic steatohepatitis (NASH): Secondary | ICD-10-CM | POA: Diagnosis not present

## 2018-05-08 DIAGNOSIS — J910 Malignant pleural effusion: Secondary | ICD-10-CM | POA: Diagnosis not present

## 2018-05-08 DIAGNOSIS — Z853 Personal history of malignant neoplasm of breast: Secondary | ICD-10-CM | POA: Diagnosis not present

## 2018-05-14 DIAGNOSIS — K7581 Nonalcoholic steatohepatitis (NASH): Secondary | ICD-10-CM | POA: Diagnosis not present

## 2018-05-14 DIAGNOSIS — C220 Liver cell carcinoma: Secondary | ICD-10-CM | POA: Diagnosis not present

## 2018-05-14 DIAGNOSIS — J910 Malignant pleural effusion: Secondary | ICD-10-CM | POA: Diagnosis not present

## 2018-05-14 DIAGNOSIS — K7469 Other cirrhosis of liver: Secondary | ICD-10-CM | POA: Diagnosis not present

## 2018-05-14 DIAGNOSIS — Z853 Personal history of malignant neoplasm of breast: Secondary | ICD-10-CM | POA: Diagnosis not present

## 2018-05-14 DIAGNOSIS — E119 Type 2 diabetes mellitus without complications: Secondary | ICD-10-CM | POA: Diagnosis not present

## 2018-05-16 DIAGNOSIS — Z853 Personal history of malignant neoplasm of breast: Secondary | ICD-10-CM | POA: Diagnosis not present

## 2018-05-16 DIAGNOSIS — C220 Liver cell carcinoma: Secondary | ICD-10-CM | POA: Diagnosis not present

## 2018-05-16 DIAGNOSIS — E039 Hypothyroidism, unspecified: Secondary | ICD-10-CM | POA: Diagnosis not present

## 2018-05-16 DIAGNOSIS — E119 Type 2 diabetes mellitus without complications: Secondary | ICD-10-CM | POA: Diagnosis not present

## 2018-05-16 DIAGNOSIS — K7581 Nonalcoholic steatohepatitis (NASH): Secondary | ICD-10-CM | POA: Diagnosis not present

## 2018-05-16 DIAGNOSIS — I10 Essential (primary) hypertension: Secondary | ICD-10-CM | POA: Diagnosis not present

## 2018-05-16 DIAGNOSIS — K7469 Other cirrhosis of liver: Secondary | ICD-10-CM | POA: Diagnosis not present

## 2018-05-16 DIAGNOSIS — J910 Malignant pleural effusion: Secondary | ICD-10-CM | POA: Diagnosis not present

## 2018-05-22 DIAGNOSIS — E119 Type 2 diabetes mellitus without complications: Secondary | ICD-10-CM | POA: Diagnosis not present

## 2018-05-22 DIAGNOSIS — K7469 Other cirrhosis of liver: Secondary | ICD-10-CM | POA: Diagnosis not present

## 2018-05-22 DIAGNOSIS — K7581 Nonalcoholic steatohepatitis (NASH): Secondary | ICD-10-CM | POA: Diagnosis not present

## 2018-05-22 DIAGNOSIS — J910 Malignant pleural effusion: Secondary | ICD-10-CM | POA: Diagnosis not present

## 2018-05-22 DIAGNOSIS — C220 Liver cell carcinoma: Secondary | ICD-10-CM | POA: Diagnosis not present

## 2018-05-22 DIAGNOSIS — Z853 Personal history of malignant neoplasm of breast: Secondary | ICD-10-CM | POA: Diagnosis not present

## 2018-05-28 DIAGNOSIS — K7581 Nonalcoholic steatohepatitis (NASH): Secondary | ICD-10-CM | POA: Diagnosis not present

## 2018-05-28 DIAGNOSIS — K7469 Other cirrhosis of liver: Secondary | ICD-10-CM | POA: Diagnosis not present

## 2018-05-28 DIAGNOSIS — Z853 Personal history of malignant neoplasm of breast: Secondary | ICD-10-CM | POA: Diagnosis not present

## 2018-05-28 DIAGNOSIS — C220 Liver cell carcinoma: Secondary | ICD-10-CM | POA: Diagnosis not present

## 2018-05-28 DIAGNOSIS — J910 Malignant pleural effusion: Secondary | ICD-10-CM | POA: Diagnosis not present

## 2018-05-28 DIAGNOSIS — E119 Type 2 diabetes mellitus without complications: Secondary | ICD-10-CM | POA: Diagnosis not present

## 2018-05-31 ENCOUNTER — Telehealth (INDEPENDENT_AMBULATORY_CARE_PROVIDER_SITE_OTHER): Payer: Self-pay | Admitting: Internal Medicine

## 2018-05-31 NOTE — Telephone Encounter (Signed)
Niece called about patients upcoming appointment - stated patient is under palliative care at the present time.

## 2018-06-06 DIAGNOSIS — Z853 Personal history of malignant neoplasm of breast: Secondary | ICD-10-CM | POA: Diagnosis not present

## 2018-06-06 DIAGNOSIS — E119 Type 2 diabetes mellitus without complications: Secondary | ICD-10-CM | POA: Diagnosis not present

## 2018-06-06 DIAGNOSIS — K7469 Other cirrhosis of liver: Secondary | ICD-10-CM | POA: Diagnosis not present

## 2018-06-06 DIAGNOSIS — C220 Liver cell carcinoma: Secondary | ICD-10-CM | POA: Diagnosis not present

## 2018-06-06 DIAGNOSIS — J910 Malignant pleural effusion: Secondary | ICD-10-CM | POA: Diagnosis not present

## 2018-06-06 DIAGNOSIS — K7581 Nonalcoholic steatohepatitis (NASH): Secondary | ICD-10-CM | POA: Diagnosis not present

## 2018-06-11 DIAGNOSIS — K7581 Nonalcoholic steatohepatitis (NASH): Secondary | ICD-10-CM | POA: Diagnosis not present

## 2018-06-11 DIAGNOSIS — Z853 Personal history of malignant neoplasm of breast: Secondary | ICD-10-CM | POA: Diagnosis not present

## 2018-06-11 DIAGNOSIS — C220 Liver cell carcinoma: Secondary | ICD-10-CM | POA: Diagnosis not present

## 2018-06-11 DIAGNOSIS — E119 Type 2 diabetes mellitus without complications: Secondary | ICD-10-CM | POA: Diagnosis not present

## 2018-06-11 DIAGNOSIS — J910 Malignant pleural effusion: Secondary | ICD-10-CM | POA: Diagnosis not present

## 2018-06-11 DIAGNOSIS — K7469 Other cirrhosis of liver: Secondary | ICD-10-CM | POA: Diagnosis not present

## 2018-06-12 ENCOUNTER — Ambulatory Visit (INDEPENDENT_AMBULATORY_CARE_PROVIDER_SITE_OTHER): Payer: Medicare Other | Admitting: Internal Medicine

## 2018-06-15 DIAGNOSIS — Z853 Personal history of malignant neoplasm of breast: Secondary | ICD-10-CM | POA: Diagnosis not present

## 2018-06-15 DIAGNOSIS — E039 Hypothyroidism, unspecified: Secondary | ICD-10-CM | POA: Diagnosis not present

## 2018-06-15 DIAGNOSIS — I10 Essential (primary) hypertension: Secondary | ICD-10-CM | POA: Diagnosis not present

## 2018-06-15 DIAGNOSIS — K7581 Nonalcoholic steatohepatitis (NASH): Secondary | ICD-10-CM | POA: Diagnosis not present

## 2018-06-15 DIAGNOSIS — E119 Type 2 diabetes mellitus without complications: Secondary | ICD-10-CM | POA: Diagnosis not present

## 2018-06-15 DIAGNOSIS — J910 Malignant pleural effusion: Secondary | ICD-10-CM | POA: Diagnosis not present

## 2018-06-15 DIAGNOSIS — C220 Liver cell carcinoma: Secondary | ICD-10-CM | POA: Diagnosis not present

## 2018-06-15 DIAGNOSIS — K7469 Other cirrhosis of liver: Secondary | ICD-10-CM | POA: Diagnosis not present

## 2018-06-18 DIAGNOSIS — K7469 Other cirrhosis of liver: Secondary | ICD-10-CM | POA: Diagnosis not present

## 2018-06-18 DIAGNOSIS — C220 Liver cell carcinoma: Secondary | ICD-10-CM | POA: Diagnosis not present

## 2018-06-18 DIAGNOSIS — Z853 Personal history of malignant neoplasm of breast: Secondary | ICD-10-CM | POA: Diagnosis not present

## 2018-06-18 DIAGNOSIS — E119 Type 2 diabetes mellitus without complications: Secondary | ICD-10-CM | POA: Diagnosis not present

## 2018-06-18 DIAGNOSIS — K7581 Nonalcoholic steatohepatitis (NASH): Secondary | ICD-10-CM | POA: Diagnosis not present

## 2018-06-18 DIAGNOSIS — J910 Malignant pleural effusion: Secondary | ICD-10-CM | POA: Diagnosis not present

## 2018-06-20 DIAGNOSIS — L570 Actinic keratosis: Secondary | ICD-10-CM | POA: Diagnosis not present

## 2018-06-20 DIAGNOSIS — D0439 Carcinoma in situ of skin of other parts of face: Secondary | ICD-10-CM | POA: Diagnosis not present

## 2018-06-20 DIAGNOSIS — C44329 Squamous cell carcinoma of skin of other parts of face: Secondary | ICD-10-CM | POA: Diagnosis not present

## 2018-06-20 DIAGNOSIS — C44729 Squamous cell carcinoma of skin of left lower limb, including hip: Secondary | ICD-10-CM | POA: Diagnosis not present

## 2018-06-22 ENCOUNTER — Other Ambulatory Visit: Payer: Self-pay | Admitting: *Deleted

## 2018-06-22 NOTE — Patient Outreach (Signed)
[facility]) Care Management [place] Screening call- insurance referral 06/22/2018  Cheryl Velasquez [birth year] [id number]  Successful telephone outreach for insurance screening to [name], 78 y/o female; HIPAA/ identity verified.  Explained purpose of today's call to patient as well as [facility] CM services; patient reports today that she is currently active with Hospice Services and she does not need additional assistance; stated that her hospice nurse and doctor visit her at her home "at least once a week."  Given that patient verifies that she is currently active with hospice services, remainder of screening call was deferred.  Oneta Rack, RN, BSN, [employer] [facility] Care Management  [phone]

## 2018-06-26 DIAGNOSIS — C220 Liver cell carcinoma: Secondary | ICD-10-CM | POA: Diagnosis not present

## 2018-06-26 DIAGNOSIS — K7581 Nonalcoholic steatohepatitis (NASH): Secondary | ICD-10-CM | POA: Diagnosis not present

## 2018-06-26 DIAGNOSIS — K7469 Other cirrhosis of liver: Secondary | ICD-10-CM | POA: Diagnosis not present

## 2018-06-26 DIAGNOSIS — E119 Type 2 diabetes mellitus without complications: Secondary | ICD-10-CM | POA: Diagnosis not present

## 2018-06-26 DIAGNOSIS — Z853 Personal history of malignant neoplasm of breast: Secondary | ICD-10-CM | POA: Diagnosis not present

## 2018-06-26 DIAGNOSIS — J910 Malignant pleural effusion: Secondary | ICD-10-CM | POA: Diagnosis not present

## 2018-07-02 DIAGNOSIS — C44329 Squamous cell carcinoma of skin of other parts of face: Secondary | ICD-10-CM | POA: Diagnosis not present

## 2018-07-02 DIAGNOSIS — C44729 Squamous cell carcinoma of skin of left lower limb, including hip: Secondary | ICD-10-CM | POA: Diagnosis not present

## 2018-07-03 DIAGNOSIS — Z853 Personal history of malignant neoplasm of breast: Secondary | ICD-10-CM | POA: Diagnosis not present

## 2018-07-03 DIAGNOSIS — E119 Type 2 diabetes mellitus without complications: Secondary | ICD-10-CM | POA: Diagnosis not present

## 2018-07-03 DIAGNOSIS — K7581 Nonalcoholic steatohepatitis (NASH): Secondary | ICD-10-CM | POA: Diagnosis not present

## 2018-07-03 DIAGNOSIS — K7469 Other cirrhosis of liver: Secondary | ICD-10-CM | POA: Diagnosis not present

## 2018-07-03 DIAGNOSIS — C220 Liver cell carcinoma: Secondary | ICD-10-CM | POA: Diagnosis not present

## 2018-07-03 DIAGNOSIS — J910 Malignant pleural effusion: Secondary | ICD-10-CM | POA: Diagnosis not present

## 2018-07-04 DIAGNOSIS — K7581 Nonalcoholic steatohepatitis (NASH): Secondary | ICD-10-CM | POA: Diagnosis not present

## 2018-07-04 DIAGNOSIS — Z853 Personal history of malignant neoplasm of breast: Secondary | ICD-10-CM | POA: Diagnosis not present

## 2018-07-04 DIAGNOSIS — E119 Type 2 diabetes mellitus without complications: Secondary | ICD-10-CM | POA: Diagnosis not present

## 2018-07-04 DIAGNOSIS — K7469 Other cirrhosis of liver: Secondary | ICD-10-CM | POA: Diagnosis not present

## 2018-07-04 DIAGNOSIS — C220 Liver cell carcinoma: Secondary | ICD-10-CM | POA: Diagnosis not present

## 2018-07-04 DIAGNOSIS — J910 Malignant pleural effusion: Secondary | ICD-10-CM | POA: Diagnosis not present

## 2018-07-05 DIAGNOSIS — L600 Ingrowing nail: Secondary | ICD-10-CM | POA: Diagnosis not present

## 2018-07-05 DIAGNOSIS — M79671 Pain in right foot: Secondary | ICD-10-CM | POA: Diagnosis not present

## 2018-07-05 DIAGNOSIS — L03031 Cellulitis of right toe: Secondary | ICD-10-CM | POA: Diagnosis not present

## 2018-07-05 DIAGNOSIS — M79675 Pain in left toe(s): Secondary | ICD-10-CM | POA: Diagnosis not present

## 2018-07-05 DIAGNOSIS — M79672 Pain in left foot: Secondary | ICD-10-CM | POA: Diagnosis not present

## 2018-07-05 DIAGNOSIS — L03032 Cellulitis of left toe: Secondary | ICD-10-CM | POA: Diagnosis not present

## 2018-07-05 DIAGNOSIS — M79674 Pain in right toe(s): Secondary | ICD-10-CM | POA: Diagnosis not present

## 2018-07-05 DIAGNOSIS — E1151 Type 2 diabetes mellitus with diabetic peripheral angiopathy without gangrene: Secondary | ICD-10-CM | POA: Diagnosis not present

## 2018-07-05 DIAGNOSIS — E1140 Type 2 diabetes mellitus with diabetic neuropathy, unspecified: Secondary | ICD-10-CM | POA: Diagnosis not present

## 2018-07-06 DIAGNOSIS — E119 Type 2 diabetes mellitus without complications: Secondary | ICD-10-CM | POA: Diagnosis not present

## 2018-07-06 DIAGNOSIS — C220 Liver cell carcinoma: Secondary | ICD-10-CM | POA: Diagnosis not present

## 2018-07-06 DIAGNOSIS — K7581 Nonalcoholic steatohepatitis (NASH): Secondary | ICD-10-CM | POA: Diagnosis not present

## 2018-07-06 DIAGNOSIS — J910 Malignant pleural effusion: Secondary | ICD-10-CM | POA: Diagnosis not present

## 2018-07-06 DIAGNOSIS — Z853 Personal history of malignant neoplasm of breast: Secondary | ICD-10-CM | POA: Diagnosis not present

## 2018-07-06 DIAGNOSIS — K7469 Other cirrhosis of liver: Secondary | ICD-10-CM | POA: Diagnosis not present

## 2018-07-11 DIAGNOSIS — C220 Liver cell carcinoma: Secondary | ICD-10-CM | POA: Diagnosis not present

## 2018-07-11 DIAGNOSIS — Z853 Personal history of malignant neoplasm of breast: Secondary | ICD-10-CM | POA: Diagnosis not present

## 2018-07-11 DIAGNOSIS — K7469 Other cirrhosis of liver: Secondary | ICD-10-CM | POA: Diagnosis not present

## 2018-07-11 DIAGNOSIS — J910 Malignant pleural effusion: Secondary | ICD-10-CM | POA: Diagnosis not present

## 2018-07-11 DIAGNOSIS — K7581 Nonalcoholic steatohepatitis (NASH): Secondary | ICD-10-CM | POA: Diagnosis not present

## 2018-07-11 DIAGNOSIS — E119 Type 2 diabetes mellitus without complications: Secondary | ICD-10-CM | POA: Diagnosis not present

## 2018-07-16 DIAGNOSIS — K7581 Nonalcoholic steatohepatitis (NASH): Secondary | ICD-10-CM | POA: Diagnosis not present

## 2018-07-16 DIAGNOSIS — E119 Type 2 diabetes mellitus without complications: Secondary | ICD-10-CM | POA: Diagnosis not present

## 2018-07-16 DIAGNOSIS — J910 Malignant pleural effusion: Secondary | ICD-10-CM | POA: Diagnosis not present

## 2018-07-16 DIAGNOSIS — Z853 Personal history of malignant neoplasm of breast: Secondary | ICD-10-CM | POA: Diagnosis not present

## 2018-07-16 DIAGNOSIS — I10 Essential (primary) hypertension: Secondary | ICD-10-CM | POA: Diagnosis not present

## 2018-07-16 DIAGNOSIS — C220 Liver cell carcinoma: Secondary | ICD-10-CM | POA: Diagnosis not present

## 2018-07-16 DIAGNOSIS — K7469 Other cirrhosis of liver: Secondary | ICD-10-CM | POA: Diagnosis not present

## 2018-07-16 DIAGNOSIS — E039 Hypothyroidism, unspecified: Secondary | ICD-10-CM | POA: Diagnosis not present

## 2018-07-17 DIAGNOSIS — C220 Liver cell carcinoma: Secondary | ICD-10-CM | POA: Diagnosis not present

## 2018-07-17 DIAGNOSIS — J910 Malignant pleural effusion: Secondary | ICD-10-CM | POA: Diagnosis not present

## 2018-07-17 DIAGNOSIS — E119 Type 2 diabetes mellitus without complications: Secondary | ICD-10-CM | POA: Diagnosis not present

## 2018-07-17 DIAGNOSIS — K7581 Nonalcoholic steatohepatitis (NASH): Secondary | ICD-10-CM | POA: Diagnosis not present

## 2018-07-17 DIAGNOSIS — K7469 Other cirrhosis of liver: Secondary | ICD-10-CM | POA: Diagnosis not present

## 2018-07-17 DIAGNOSIS — Z853 Personal history of malignant neoplasm of breast: Secondary | ICD-10-CM | POA: Diagnosis not present

## 2018-07-24 DIAGNOSIS — Z853 Personal history of malignant neoplasm of breast: Secondary | ICD-10-CM | POA: Diagnosis not present

## 2018-07-24 DIAGNOSIS — K7581 Nonalcoholic steatohepatitis (NASH): Secondary | ICD-10-CM | POA: Diagnosis not present

## 2018-07-24 DIAGNOSIS — K7469 Other cirrhosis of liver: Secondary | ICD-10-CM | POA: Diagnosis not present

## 2018-07-24 DIAGNOSIS — E119 Type 2 diabetes mellitus without complications: Secondary | ICD-10-CM | POA: Diagnosis not present

## 2018-07-24 DIAGNOSIS — J910 Malignant pleural effusion: Secondary | ICD-10-CM | POA: Diagnosis not present

## 2018-07-24 DIAGNOSIS — C220 Liver cell carcinoma: Secondary | ICD-10-CM | POA: Diagnosis not present

## 2018-08-01 DIAGNOSIS — E119 Type 2 diabetes mellitus without complications: Secondary | ICD-10-CM | POA: Diagnosis not present

## 2018-08-01 DIAGNOSIS — K7581 Nonalcoholic steatohepatitis (NASH): Secondary | ICD-10-CM | POA: Diagnosis not present

## 2018-08-01 DIAGNOSIS — Z853 Personal history of malignant neoplasm of breast: Secondary | ICD-10-CM | POA: Diagnosis not present

## 2018-08-01 DIAGNOSIS — K7469 Other cirrhosis of liver: Secondary | ICD-10-CM | POA: Diagnosis not present

## 2018-08-01 DIAGNOSIS — J910 Malignant pleural effusion: Secondary | ICD-10-CM | POA: Diagnosis not present

## 2018-08-01 DIAGNOSIS — C220 Liver cell carcinoma: Secondary | ICD-10-CM | POA: Diagnosis not present

## 2018-08-02 DIAGNOSIS — K7581 Nonalcoholic steatohepatitis (NASH): Secondary | ICD-10-CM | POA: Diagnosis not present

## 2018-08-02 DIAGNOSIS — C220 Liver cell carcinoma: Secondary | ICD-10-CM | POA: Diagnosis not present

## 2018-08-02 DIAGNOSIS — Z853 Personal history of malignant neoplasm of breast: Secondary | ICD-10-CM | POA: Diagnosis not present

## 2018-08-02 DIAGNOSIS — J910 Malignant pleural effusion: Secondary | ICD-10-CM | POA: Diagnosis not present

## 2018-08-02 DIAGNOSIS — E119 Type 2 diabetes mellitus without complications: Secondary | ICD-10-CM | POA: Diagnosis not present

## 2018-08-02 DIAGNOSIS — K7469 Other cirrhosis of liver: Secondary | ICD-10-CM | POA: Diagnosis not present

## 2018-08-03 DIAGNOSIS — C220 Liver cell carcinoma: Secondary | ICD-10-CM | POA: Diagnosis not present

## 2018-08-03 DIAGNOSIS — K7469 Other cirrhosis of liver: Secondary | ICD-10-CM | POA: Diagnosis not present

## 2018-08-03 DIAGNOSIS — K7581 Nonalcoholic steatohepatitis (NASH): Secondary | ICD-10-CM | POA: Diagnosis not present

## 2018-08-03 DIAGNOSIS — J910 Malignant pleural effusion: Secondary | ICD-10-CM | POA: Diagnosis not present

## 2018-08-03 DIAGNOSIS — Z853 Personal history of malignant neoplasm of breast: Secondary | ICD-10-CM | POA: Diagnosis not present

## 2018-08-03 DIAGNOSIS — E119 Type 2 diabetes mellitus without complications: Secondary | ICD-10-CM | POA: Diagnosis not present

## 2018-08-06 DIAGNOSIS — E119 Type 2 diabetes mellitus without complications: Secondary | ICD-10-CM | POA: Diagnosis not present

## 2018-08-06 DIAGNOSIS — K7469 Other cirrhosis of liver: Secondary | ICD-10-CM | POA: Diagnosis not present

## 2018-08-06 DIAGNOSIS — C220 Liver cell carcinoma: Secondary | ICD-10-CM | POA: Diagnosis not present

## 2018-08-06 DIAGNOSIS — Z853 Personal history of malignant neoplasm of breast: Secondary | ICD-10-CM | POA: Diagnosis not present

## 2018-08-06 DIAGNOSIS — K7581 Nonalcoholic steatohepatitis (NASH): Secondary | ICD-10-CM | POA: Diagnosis not present

## 2018-08-06 DIAGNOSIS — J910 Malignant pleural effusion: Secondary | ICD-10-CM | POA: Diagnosis not present

## 2018-08-07 DIAGNOSIS — E119 Type 2 diabetes mellitus without complications: Secondary | ICD-10-CM | POA: Diagnosis not present

## 2018-08-07 DIAGNOSIS — J910 Malignant pleural effusion: Secondary | ICD-10-CM | POA: Diagnosis not present

## 2018-08-07 DIAGNOSIS — K7469 Other cirrhosis of liver: Secondary | ICD-10-CM | POA: Diagnosis not present

## 2018-08-07 DIAGNOSIS — K7581 Nonalcoholic steatohepatitis (NASH): Secondary | ICD-10-CM | POA: Diagnosis not present

## 2018-08-07 DIAGNOSIS — Z853 Personal history of malignant neoplasm of breast: Secondary | ICD-10-CM | POA: Diagnosis not present

## 2018-08-07 DIAGNOSIS — C220 Liver cell carcinoma: Secondary | ICD-10-CM | POA: Diagnosis not present

## 2018-08-15 DIAGNOSIS — C220 Liver cell carcinoma: Secondary | ICD-10-CM | POA: Diagnosis not present

## 2018-08-15 DIAGNOSIS — E039 Hypothyroidism, unspecified: Secondary | ICD-10-CM | POA: Diagnosis not present

## 2018-08-15 DIAGNOSIS — Z853 Personal history of malignant neoplasm of breast: Secondary | ICD-10-CM | POA: Diagnosis not present

## 2018-08-15 DIAGNOSIS — J910 Malignant pleural effusion: Secondary | ICD-10-CM | POA: Diagnosis not present

## 2018-08-15 DIAGNOSIS — I10 Essential (primary) hypertension: Secondary | ICD-10-CM | POA: Diagnosis not present

## 2018-08-15 DIAGNOSIS — K7469 Other cirrhosis of liver: Secondary | ICD-10-CM | POA: Diagnosis not present

## 2018-08-15 DIAGNOSIS — K7581 Nonalcoholic steatohepatitis (NASH): Secondary | ICD-10-CM | POA: Diagnosis not present

## 2018-08-15 DIAGNOSIS — E119 Type 2 diabetes mellitus without complications: Secondary | ICD-10-CM | POA: Diagnosis not present

## 2018-08-17 DIAGNOSIS — J910 Malignant pleural effusion: Secondary | ICD-10-CM | POA: Diagnosis not present

## 2018-08-17 DIAGNOSIS — E119 Type 2 diabetes mellitus without complications: Secondary | ICD-10-CM | POA: Diagnosis not present

## 2018-08-17 DIAGNOSIS — Z853 Personal history of malignant neoplasm of breast: Secondary | ICD-10-CM | POA: Diagnosis not present

## 2018-08-17 DIAGNOSIS — C220 Liver cell carcinoma: Secondary | ICD-10-CM | POA: Diagnosis not present

## 2018-08-17 DIAGNOSIS — K7469 Other cirrhosis of liver: Secondary | ICD-10-CM | POA: Diagnosis not present

## 2018-08-17 DIAGNOSIS — K7581 Nonalcoholic steatohepatitis (NASH): Secondary | ICD-10-CM | POA: Diagnosis not present

## 2018-08-21 DIAGNOSIS — K7581 Nonalcoholic steatohepatitis (NASH): Secondary | ICD-10-CM | POA: Diagnosis not present

## 2018-08-21 DIAGNOSIS — Z853 Personal history of malignant neoplasm of breast: Secondary | ICD-10-CM | POA: Diagnosis not present

## 2018-08-21 DIAGNOSIS — E119 Type 2 diabetes mellitus without complications: Secondary | ICD-10-CM | POA: Diagnosis not present

## 2018-08-21 DIAGNOSIS — C220 Liver cell carcinoma: Secondary | ICD-10-CM | POA: Diagnosis not present

## 2018-08-21 DIAGNOSIS — J910 Malignant pleural effusion: Secondary | ICD-10-CM | POA: Diagnosis not present

## 2018-08-21 DIAGNOSIS — K7469 Other cirrhosis of liver: Secondary | ICD-10-CM | POA: Diagnosis not present

## 2018-08-24 DIAGNOSIS — E119 Type 2 diabetes mellitus without complications: Secondary | ICD-10-CM | POA: Diagnosis not present

## 2018-08-24 DIAGNOSIS — C220 Liver cell carcinoma: Secondary | ICD-10-CM | POA: Diagnosis not present

## 2018-08-24 DIAGNOSIS — K7469 Other cirrhosis of liver: Secondary | ICD-10-CM | POA: Diagnosis not present

## 2018-08-24 DIAGNOSIS — J910 Malignant pleural effusion: Secondary | ICD-10-CM | POA: Diagnosis not present

## 2018-08-24 DIAGNOSIS — Z853 Personal history of malignant neoplasm of breast: Secondary | ICD-10-CM | POA: Diagnosis not present

## 2018-08-24 DIAGNOSIS — K7581 Nonalcoholic steatohepatitis (NASH): Secondary | ICD-10-CM | POA: Diagnosis not present

## 2018-08-31 DIAGNOSIS — K7581 Nonalcoholic steatohepatitis (NASH): Secondary | ICD-10-CM | POA: Diagnosis not present

## 2018-08-31 DIAGNOSIS — J910 Malignant pleural effusion: Secondary | ICD-10-CM | POA: Diagnosis not present

## 2018-08-31 DIAGNOSIS — E119 Type 2 diabetes mellitus without complications: Secondary | ICD-10-CM | POA: Diagnosis not present

## 2018-08-31 DIAGNOSIS — C220 Liver cell carcinoma: Secondary | ICD-10-CM | POA: Diagnosis not present

## 2018-08-31 DIAGNOSIS — K7469 Other cirrhosis of liver: Secondary | ICD-10-CM | POA: Diagnosis not present

## 2018-08-31 DIAGNOSIS — Z853 Personal history of malignant neoplasm of breast: Secondary | ICD-10-CM | POA: Diagnosis not present

## 2018-09-01 DIAGNOSIS — C220 Liver cell carcinoma: Secondary | ICD-10-CM | POA: Diagnosis not present

## 2018-09-01 DIAGNOSIS — J910 Malignant pleural effusion: Secondary | ICD-10-CM | POA: Diagnosis not present

## 2018-09-01 DIAGNOSIS — E119 Type 2 diabetes mellitus without complications: Secondary | ICD-10-CM | POA: Diagnosis not present

## 2018-09-01 DIAGNOSIS — K7469 Other cirrhosis of liver: Secondary | ICD-10-CM | POA: Diagnosis not present

## 2018-09-01 DIAGNOSIS — K7581 Nonalcoholic steatohepatitis (NASH): Secondary | ICD-10-CM | POA: Diagnosis not present

## 2018-09-01 DIAGNOSIS — Z853 Personal history of malignant neoplasm of breast: Secondary | ICD-10-CM | POA: Diagnosis not present

## 2018-09-02 DIAGNOSIS — K7469 Other cirrhosis of liver: Secondary | ICD-10-CM | POA: Diagnosis not present

## 2018-09-02 DIAGNOSIS — C220 Liver cell carcinoma: Secondary | ICD-10-CM | POA: Diagnosis not present

## 2018-09-02 DIAGNOSIS — J910 Malignant pleural effusion: Secondary | ICD-10-CM | POA: Diagnosis not present

## 2018-09-02 DIAGNOSIS — K7581 Nonalcoholic steatohepatitis (NASH): Secondary | ICD-10-CM | POA: Diagnosis not present

## 2018-09-02 DIAGNOSIS — Z853 Personal history of malignant neoplasm of breast: Secondary | ICD-10-CM | POA: Diagnosis not present

## 2018-09-02 DIAGNOSIS — E119 Type 2 diabetes mellitus without complications: Secondary | ICD-10-CM | POA: Diagnosis not present

## 2018-09-06 DIAGNOSIS — J910 Malignant pleural effusion: Secondary | ICD-10-CM | POA: Diagnosis not present

## 2018-09-06 DIAGNOSIS — C220 Liver cell carcinoma: Secondary | ICD-10-CM | POA: Diagnosis not present

## 2018-09-06 DIAGNOSIS — Z853 Personal history of malignant neoplasm of breast: Secondary | ICD-10-CM | POA: Diagnosis not present

## 2018-09-06 DIAGNOSIS — E119 Type 2 diabetes mellitus without complications: Secondary | ICD-10-CM | POA: Diagnosis not present

## 2018-09-06 DIAGNOSIS — K7581 Nonalcoholic steatohepatitis (NASH): Secondary | ICD-10-CM | POA: Diagnosis not present

## 2018-09-06 DIAGNOSIS — K7469 Other cirrhosis of liver: Secondary | ICD-10-CM | POA: Diagnosis not present

## 2018-09-13 DIAGNOSIS — Z853 Personal history of malignant neoplasm of breast: Secondary | ICD-10-CM | POA: Diagnosis not present

## 2018-09-13 DIAGNOSIS — K7581 Nonalcoholic steatohepatitis (NASH): Secondary | ICD-10-CM | POA: Diagnosis not present

## 2018-09-13 DIAGNOSIS — J910 Malignant pleural effusion: Secondary | ICD-10-CM | POA: Diagnosis not present

## 2018-09-13 DIAGNOSIS — K7469 Other cirrhosis of liver: Secondary | ICD-10-CM | POA: Diagnosis not present

## 2018-09-13 DIAGNOSIS — E119 Type 2 diabetes mellitus without complications: Secondary | ICD-10-CM | POA: Diagnosis not present

## 2018-09-13 DIAGNOSIS — C220 Liver cell carcinoma: Secondary | ICD-10-CM | POA: Diagnosis not present

## 2018-09-15 DIAGNOSIS — E119 Type 2 diabetes mellitus without complications: Secondary | ICD-10-CM | POA: Diagnosis not present

## 2018-09-15 DIAGNOSIS — J910 Malignant pleural effusion: Secondary | ICD-10-CM | POA: Diagnosis not present

## 2018-09-15 DIAGNOSIS — C220 Liver cell carcinoma: Secondary | ICD-10-CM | POA: Diagnosis not present

## 2018-09-15 DIAGNOSIS — I10 Essential (primary) hypertension: Secondary | ICD-10-CM | POA: Diagnosis not present

## 2018-09-15 DIAGNOSIS — E039 Hypothyroidism, unspecified: Secondary | ICD-10-CM | POA: Diagnosis not present

## 2018-09-15 DIAGNOSIS — K7469 Other cirrhosis of liver: Secondary | ICD-10-CM | POA: Diagnosis not present

## 2018-09-15 DIAGNOSIS — K7581 Nonalcoholic steatohepatitis (NASH): Secondary | ICD-10-CM | POA: Diagnosis not present

## 2018-09-15 DIAGNOSIS — Z853 Personal history of malignant neoplasm of breast: Secondary | ICD-10-CM | POA: Diagnosis not present

## 2018-09-16 DIAGNOSIS — E119 Type 2 diabetes mellitus without complications: Secondary | ICD-10-CM | POA: Diagnosis not present

## 2018-09-16 DIAGNOSIS — J910 Malignant pleural effusion: Secondary | ICD-10-CM | POA: Diagnosis not present

## 2018-09-16 DIAGNOSIS — C220 Liver cell carcinoma: Secondary | ICD-10-CM | POA: Diagnosis not present

## 2018-09-16 DIAGNOSIS — K7581 Nonalcoholic steatohepatitis (NASH): Secondary | ICD-10-CM | POA: Diagnosis not present

## 2018-09-16 DIAGNOSIS — Z853 Personal history of malignant neoplasm of breast: Secondary | ICD-10-CM | POA: Diagnosis not present

## 2018-09-16 DIAGNOSIS — K7469 Other cirrhosis of liver: Secondary | ICD-10-CM | POA: Diagnosis not present

## 2018-09-19 DIAGNOSIS — K7581 Nonalcoholic steatohepatitis (NASH): Secondary | ICD-10-CM | POA: Diagnosis not present

## 2018-09-19 DIAGNOSIS — Z853 Personal history of malignant neoplasm of breast: Secondary | ICD-10-CM | POA: Diagnosis not present

## 2018-09-19 DIAGNOSIS — E119 Type 2 diabetes mellitus without complications: Secondary | ICD-10-CM | POA: Diagnosis not present

## 2018-09-19 DIAGNOSIS — J910 Malignant pleural effusion: Secondary | ICD-10-CM | POA: Diagnosis not present

## 2018-09-19 DIAGNOSIS — K7469 Other cirrhosis of liver: Secondary | ICD-10-CM | POA: Diagnosis not present

## 2018-09-19 DIAGNOSIS — C220 Liver cell carcinoma: Secondary | ICD-10-CM | POA: Diagnosis not present

## 2018-09-20 DIAGNOSIS — K7469 Other cirrhosis of liver: Secondary | ICD-10-CM | POA: Diagnosis not present

## 2018-09-20 DIAGNOSIS — J910 Malignant pleural effusion: Secondary | ICD-10-CM | POA: Diagnosis not present

## 2018-09-20 DIAGNOSIS — C220 Liver cell carcinoma: Secondary | ICD-10-CM | POA: Diagnosis not present

## 2018-09-20 DIAGNOSIS — Z853 Personal history of malignant neoplasm of breast: Secondary | ICD-10-CM | POA: Diagnosis not present

## 2018-09-20 DIAGNOSIS — K7581 Nonalcoholic steatohepatitis (NASH): Secondary | ICD-10-CM | POA: Diagnosis not present

## 2018-09-20 DIAGNOSIS — E119 Type 2 diabetes mellitus without complications: Secondary | ICD-10-CM | POA: Diagnosis not present

## 2018-09-28 DIAGNOSIS — Z853 Personal history of malignant neoplasm of breast: Secondary | ICD-10-CM | POA: Diagnosis not present

## 2018-09-28 DIAGNOSIS — C220 Liver cell carcinoma: Secondary | ICD-10-CM | POA: Diagnosis not present

## 2018-09-28 DIAGNOSIS — K7469 Other cirrhosis of liver: Secondary | ICD-10-CM | POA: Diagnosis not present

## 2018-09-28 DIAGNOSIS — J910 Malignant pleural effusion: Secondary | ICD-10-CM | POA: Diagnosis not present

## 2018-09-28 DIAGNOSIS — E119 Type 2 diabetes mellitus without complications: Secondary | ICD-10-CM | POA: Diagnosis not present

## 2018-09-28 DIAGNOSIS — K7581 Nonalcoholic steatohepatitis (NASH): Secondary | ICD-10-CM | POA: Diagnosis not present

## 2018-09-30 DIAGNOSIS — Z853 Personal history of malignant neoplasm of breast: Secondary | ICD-10-CM | POA: Diagnosis not present

## 2018-09-30 DIAGNOSIS — J910 Malignant pleural effusion: Secondary | ICD-10-CM | POA: Diagnosis not present

## 2018-09-30 DIAGNOSIS — C220 Liver cell carcinoma: Secondary | ICD-10-CM | POA: Diagnosis not present

## 2018-09-30 DIAGNOSIS — K7469 Other cirrhosis of liver: Secondary | ICD-10-CM | POA: Diagnosis not present

## 2018-09-30 DIAGNOSIS — K7581 Nonalcoholic steatohepatitis (NASH): Secondary | ICD-10-CM | POA: Diagnosis not present

## 2018-09-30 DIAGNOSIS — E119 Type 2 diabetes mellitus without complications: Secondary | ICD-10-CM | POA: Diagnosis not present

## 2018-10-02 DIAGNOSIS — E1151 Type 2 diabetes mellitus with diabetic peripheral angiopathy without gangrene: Secondary | ICD-10-CM | POA: Diagnosis not present

## 2018-10-02 DIAGNOSIS — M79674 Pain in right toe(s): Secondary | ICD-10-CM | POA: Diagnosis not present

## 2018-10-02 DIAGNOSIS — M79672 Pain in left foot: Secondary | ICD-10-CM | POA: Diagnosis not present

## 2018-10-02 DIAGNOSIS — L03031 Cellulitis of right toe: Secondary | ICD-10-CM | POA: Diagnosis not present

## 2018-10-02 DIAGNOSIS — M79675 Pain in left toe(s): Secondary | ICD-10-CM | POA: Diagnosis not present

## 2018-10-02 DIAGNOSIS — L03032 Cellulitis of left toe: Secondary | ICD-10-CM | POA: Diagnosis not present

## 2018-10-02 DIAGNOSIS — E1140 Type 2 diabetes mellitus with diabetic neuropathy, unspecified: Secondary | ICD-10-CM | POA: Diagnosis not present

## 2018-10-02 DIAGNOSIS — M79671 Pain in right foot: Secondary | ICD-10-CM | POA: Diagnosis not present

## 2018-10-02 DIAGNOSIS — L600 Ingrowing nail: Secondary | ICD-10-CM | POA: Diagnosis not present

## 2018-10-03 DIAGNOSIS — C220 Liver cell carcinoma: Secondary | ICD-10-CM | POA: Diagnosis not present

## 2018-10-03 DIAGNOSIS — K7581 Nonalcoholic steatohepatitis (NASH): Secondary | ICD-10-CM | POA: Diagnosis not present

## 2018-10-03 DIAGNOSIS — K7469 Other cirrhosis of liver: Secondary | ICD-10-CM | POA: Diagnosis not present

## 2018-10-03 DIAGNOSIS — E119 Type 2 diabetes mellitus without complications: Secondary | ICD-10-CM | POA: Diagnosis not present

## 2018-10-03 DIAGNOSIS — Z853 Personal history of malignant neoplasm of breast: Secondary | ICD-10-CM | POA: Diagnosis not present

## 2018-10-03 DIAGNOSIS — J910 Malignant pleural effusion: Secondary | ICD-10-CM | POA: Diagnosis not present

## 2018-10-10 DIAGNOSIS — C220 Liver cell carcinoma: Secondary | ICD-10-CM | POA: Diagnosis not present

## 2018-10-10 DIAGNOSIS — K7469 Other cirrhosis of liver: Secondary | ICD-10-CM | POA: Diagnosis not present

## 2018-10-10 DIAGNOSIS — K7581 Nonalcoholic steatohepatitis (NASH): Secondary | ICD-10-CM | POA: Diagnosis not present

## 2018-10-10 DIAGNOSIS — E119 Type 2 diabetes mellitus without complications: Secondary | ICD-10-CM | POA: Diagnosis not present

## 2018-10-10 DIAGNOSIS — J910 Malignant pleural effusion: Secondary | ICD-10-CM | POA: Diagnosis not present

## 2018-10-10 DIAGNOSIS — Z853 Personal history of malignant neoplasm of breast: Secondary | ICD-10-CM | POA: Diagnosis not present

## 2018-10-11 DIAGNOSIS — K7469 Other cirrhosis of liver: Secondary | ICD-10-CM | POA: Diagnosis not present

## 2018-10-11 DIAGNOSIS — J910 Malignant pleural effusion: Secondary | ICD-10-CM | POA: Diagnosis not present

## 2018-10-11 DIAGNOSIS — Z853 Personal history of malignant neoplasm of breast: Secondary | ICD-10-CM | POA: Diagnosis not present

## 2018-10-11 DIAGNOSIS — E119 Type 2 diabetes mellitus without complications: Secondary | ICD-10-CM | POA: Diagnosis not present

## 2018-10-11 DIAGNOSIS — K7581 Nonalcoholic steatohepatitis (NASH): Secondary | ICD-10-CM | POA: Diagnosis not present

## 2018-10-11 DIAGNOSIS — C220 Liver cell carcinoma: Secondary | ICD-10-CM | POA: Diagnosis not present

## 2018-10-16 DIAGNOSIS — I10 Essential (primary) hypertension: Secondary | ICD-10-CM | POA: Diagnosis not present

## 2018-10-16 DIAGNOSIS — E119 Type 2 diabetes mellitus without complications: Secondary | ICD-10-CM | POA: Diagnosis not present

## 2018-10-16 DIAGNOSIS — K7581 Nonalcoholic steatohepatitis (NASH): Secondary | ICD-10-CM | POA: Diagnosis not present

## 2018-10-16 DIAGNOSIS — E039 Hypothyroidism, unspecified: Secondary | ICD-10-CM | POA: Diagnosis not present

## 2018-10-16 DIAGNOSIS — C220 Liver cell carcinoma: Secondary | ICD-10-CM | POA: Diagnosis not present

## 2018-10-16 DIAGNOSIS — K7469 Other cirrhosis of liver: Secondary | ICD-10-CM | POA: Diagnosis not present

## 2018-10-16 DIAGNOSIS — J910 Malignant pleural effusion: Secondary | ICD-10-CM | POA: Diagnosis not present

## 2018-10-16 DIAGNOSIS — Z853 Personal history of malignant neoplasm of breast: Secondary | ICD-10-CM | POA: Diagnosis not present

## 2018-10-17 DIAGNOSIS — Z853 Personal history of malignant neoplasm of breast: Secondary | ICD-10-CM | POA: Diagnosis not present

## 2018-10-17 DIAGNOSIS — J910 Malignant pleural effusion: Secondary | ICD-10-CM | POA: Diagnosis not present

## 2018-10-17 DIAGNOSIS — K7469 Other cirrhosis of liver: Secondary | ICD-10-CM | POA: Diagnosis not present

## 2018-10-17 DIAGNOSIS — K7581 Nonalcoholic steatohepatitis (NASH): Secondary | ICD-10-CM | POA: Diagnosis not present

## 2018-10-17 DIAGNOSIS — E119 Type 2 diabetes mellitus without complications: Secondary | ICD-10-CM | POA: Diagnosis not present

## 2018-10-17 DIAGNOSIS — C220 Liver cell carcinoma: Secondary | ICD-10-CM | POA: Diagnosis not present

## 2018-10-18 DIAGNOSIS — E119 Type 2 diabetes mellitus without complications: Secondary | ICD-10-CM | POA: Diagnosis not present

## 2018-10-18 DIAGNOSIS — J910 Malignant pleural effusion: Secondary | ICD-10-CM | POA: Diagnosis not present

## 2018-10-18 DIAGNOSIS — C220 Liver cell carcinoma: Secondary | ICD-10-CM | POA: Diagnosis not present

## 2018-10-18 DIAGNOSIS — Z853 Personal history of malignant neoplasm of breast: Secondary | ICD-10-CM | POA: Diagnosis not present

## 2018-10-18 DIAGNOSIS — K7581 Nonalcoholic steatohepatitis (NASH): Secondary | ICD-10-CM | POA: Diagnosis not present

## 2018-10-18 DIAGNOSIS — K7469 Other cirrhosis of liver: Secondary | ICD-10-CM | POA: Diagnosis not present

## 2018-10-23 DIAGNOSIS — J910 Malignant pleural effusion: Secondary | ICD-10-CM | POA: Diagnosis not present

## 2018-10-23 DIAGNOSIS — K7581 Nonalcoholic steatohepatitis (NASH): Secondary | ICD-10-CM | POA: Diagnosis not present

## 2018-10-23 DIAGNOSIS — C220 Liver cell carcinoma: Secondary | ICD-10-CM | POA: Diagnosis not present

## 2018-10-23 DIAGNOSIS — K7469 Other cirrhosis of liver: Secondary | ICD-10-CM | POA: Diagnosis not present

## 2018-10-23 DIAGNOSIS — E119 Type 2 diabetes mellitus without complications: Secondary | ICD-10-CM | POA: Diagnosis not present

## 2018-10-23 DIAGNOSIS — Z853 Personal history of malignant neoplasm of breast: Secondary | ICD-10-CM | POA: Diagnosis not present

## 2018-10-25 DIAGNOSIS — K7469 Other cirrhosis of liver: Secondary | ICD-10-CM | POA: Diagnosis not present

## 2018-10-25 DIAGNOSIS — K7581 Nonalcoholic steatohepatitis (NASH): Secondary | ICD-10-CM | POA: Diagnosis not present

## 2018-10-25 DIAGNOSIS — C220 Liver cell carcinoma: Secondary | ICD-10-CM | POA: Diagnosis not present

## 2018-10-25 DIAGNOSIS — J910 Malignant pleural effusion: Secondary | ICD-10-CM | POA: Diagnosis not present

## 2018-10-25 DIAGNOSIS — Z853 Personal history of malignant neoplasm of breast: Secondary | ICD-10-CM | POA: Diagnosis not present

## 2018-10-25 DIAGNOSIS — E119 Type 2 diabetes mellitus without complications: Secondary | ICD-10-CM | POA: Diagnosis not present

## 2018-10-27 DIAGNOSIS — E1165 Type 2 diabetes mellitus with hyperglycemia: Secondary | ICD-10-CM | POA: Diagnosis not present

## 2018-10-27 DIAGNOSIS — K7581 Nonalcoholic steatohepatitis (NASH): Secondary | ICD-10-CM | POA: Diagnosis not present

## 2018-10-27 DIAGNOSIS — Z853 Personal history of malignant neoplasm of breast: Secondary | ICD-10-CM | POA: Diagnosis not present

## 2018-10-27 DIAGNOSIS — R000 Tachycardia, unspecified: Secondary | ICD-10-CM | POA: Diagnosis not present

## 2018-10-27 DIAGNOSIS — C220 Liver cell carcinoma: Secondary | ICD-10-CM | POA: Diagnosis not present

## 2018-10-27 DIAGNOSIS — K7469 Other cirrhosis of liver: Secondary | ICD-10-CM | POA: Diagnosis not present

## 2018-10-27 DIAGNOSIS — W19XXXA Unspecified fall, initial encounter: Secondary | ICD-10-CM | POA: Diagnosis not present

## 2018-10-27 DIAGNOSIS — J910 Malignant pleural effusion: Secondary | ICD-10-CM | POA: Diagnosis not present

## 2018-10-27 DIAGNOSIS — R531 Weakness: Secondary | ICD-10-CM | POA: Diagnosis not present

## 2018-10-27 DIAGNOSIS — E119 Type 2 diabetes mellitus without complications: Secondary | ICD-10-CM | POA: Diagnosis not present

## 2018-10-29 DIAGNOSIS — J910 Malignant pleural effusion: Secondary | ICD-10-CM | POA: Diagnosis not present

## 2018-10-29 DIAGNOSIS — E119 Type 2 diabetes mellitus without complications: Secondary | ICD-10-CM | POA: Diagnosis not present

## 2018-10-29 DIAGNOSIS — Z853 Personal history of malignant neoplasm of breast: Secondary | ICD-10-CM | POA: Diagnosis not present

## 2018-10-29 DIAGNOSIS — K7469 Other cirrhosis of liver: Secondary | ICD-10-CM | POA: Diagnosis not present

## 2018-10-29 DIAGNOSIS — C220 Liver cell carcinoma: Secondary | ICD-10-CM | POA: Diagnosis not present

## 2018-10-29 DIAGNOSIS — K7581 Nonalcoholic steatohepatitis (NASH): Secondary | ICD-10-CM | POA: Diagnosis not present

## 2018-10-30 DIAGNOSIS — E119 Type 2 diabetes mellitus without complications: Secondary | ICD-10-CM | POA: Diagnosis not present

## 2018-10-30 DIAGNOSIS — C220 Liver cell carcinoma: Secondary | ICD-10-CM | POA: Diagnosis not present

## 2018-10-30 DIAGNOSIS — Z853 Personal history of malignant neoplasm of breast: Secondary | ICD-10-CM | POA: Diagnosis not present

## 2018-10-30 DIAGNOSIS — K7469 Other cirrhosis of liver: Secondary | ICD-10-CM | POA: Diagnosis not present

## 2018-10-30 DIAGNOSIS — K7581 Nonalcoholic steatohepatitis (NASH): Secondary | ICD-10-CM | POA: Diagnosis not present

## 2018-10-30 DIAGNOSIS — J910 Malignant pleural effusion: Secondary | ICD-10-CM | POA: Diagnosis not present

## 2018-10-31 DIAGNOSIS — K7469 Other cirrhosis of liver: Secondary | ICD-10-CM | POA: Diagnosis not present

## 2018-10-31 DIAGNOSIS — C220 Liver cell carcinoma: Secondary | ICD-10-CM | POA: Diagnosis not present

## 2018-10-31 DIAGNOSIS — J910 Malignant pleural effusion: Secondary | ICD-10-CM | POA: Diagnosis not present

## 2018-10-31 DIAGNOSIS — K7581 Nonalcoholic steatohepatitis (NASH): Secondary | ICD-10-CM | POA: Diagnosis not present

## 2018-10-31 DIAGNOSIS — Z853 Personal history of malignant neoplasm of breast: Secondary | ICD-10-CM | POA: Diagnosis not present

## 2018-10-31 DIAGNOSIS — E119 Type 2 diabetes mellitus without complications: Secondary | ICD-10-CM | POA: Diagnosis not present

## 2018-11-01 DIAGNOSIS — C220 Liver cell carcinoma: Secondary | ICD-10-CM | POA: Diagnosis not present

## 2018-11-01 DIAGNOSIS — K7581 Nonalcoholic steatohepatitis (NASH): Secondary | ICD-10-CM | POA: Diagnosis not present

## 2018-11-01 DIAGNOSIS — K7469 Other cirrhosis of liver: Secondary | ICD-10-CM | POA: Diagnosis not present

## 2018-11-01 DIAGNOSIS — E119 Type 2 diabetes mellitus without complications: Secondary | ICD-10-CM | POA: Diagnosis not present

## 2018-11-01 DIAGNOSIS — J910 Malignant pleural effusion: Secondary | ICD-10-CM | POA: Diagnosis not present

## 2018-11-01 DIAGNOSIS — Z853 Personal history of malignant neoplasm of breast: Secondary | ICD-10-CM | POA: Diagnosis not present

## 2018-11-02 DIAGNOSIS — Z20828 Contact with and (suspected) exposure to other viral communicable diseases: Secondary | ICD-10-CM | POA: Diagnosis not present

## 2018-11-02 DIAGNOSIS — J910 Malignant pleural effusion: Secondary | ICD-10-CM | POA: Diagnosis not present

## 2018-11-02 DIAGNOSIS — E119 Type 2 diabetes mellitus without complications: Secondary | ICD-10-CM | POA: Diagnosis not present

## 2018-11-02 DIAGNOSIS — C220 Liver cell carcinoma: Secondary | ICD-10-CM | POA: Diagnosis not present

## 2018-11-02 DIAGNOSIS — Z853 Personal history of malignant neoplasm of breast: Secondary | ICD-10-CM | POA: Diagnosis not present

## 2018-11-02 DIAGNOSIS — K7581 Nonalcoholic steatohepatitis (NASH): Secondary | ICD-10-CM | POA: Diagnosis not present

## 2018-11-02 DIAGNOSIS — K7469 Other cirrhosis of liver: Secondary | ICD-10-CM | POA: Diagnosis not present

## 2018-11-05 DIAGNOSIS — K7581 Nonalcoholic steatohepatitis (NASH): Secondary | ICD-10-CM | POA: Diagnosis not present

## 2018-11-05 DIAGNOSIS — J910 Malignant pleural effusion: Secondary | ICD-10-CM | POA: Diagnosis not present

## 2018-11-05 DIAGNOSIS — C220 Liver cell carcinoma: Secondary | ICD-10-CM | POA: Diagnosis not present

## 2018-11-05 DIAGNOSIS — K7469 Other cirrhosis of liver: Secondary | ICD-10-CM | POA: Diagnosis not present

## 2018-11-05 DIAGNOSIS — Z853 Personal history of malignant neoplasm of breast: Secondary | ICD-10-CM | POA: Diagnosis not present

## 2018-11-05 DIAGNOSIS — E119 Type 2 diabetes mellitus without complications: Secondary | ICD-10-CM | POA: Diagnosis not present

## 2018-11-06 DIAGNOSIS — Z853 Personal history of malignant neoplasm of breast: Secondary | ICD-10-CM | POA: Diagnosis not present

## 2018-11-06 DIAGNOSIS — C220 Liver cell carcinoma: Secondary | ICD-10-CM | POA: Diagnosis not present

## 2018-11-06 DIAGNOSIS — K7469 Other cirrhosis of liver: Secondary | ICD-10-CM | POA: Diagnosis not present

## 2018-11-06 DIAGNOSIS — K7581 Nonalcoholic steatohepatitis (NASH): Secondary | ICD-10-CM | POA: Diagnosis not present

## 2018-11-06 DIAGNOSIS — J910 Malignant pleural effusion: Secondary | ICD-10-CM | POA: Diagnosis not present

## 2018-11-06 DIAGNOSIS — E119 Type 2 diabetes mellitus without complications: Secondary | ICD-10-CM | POA: Diagnosis not present

## 2018-11-10 DIAGNOSIS — Z20828 Contact with and (suspected) exposure to other viral communicable diseases: Secondary | ICD-10-CM | POA: Diagnosis not present

## 2018-11-10 DIAGNOSIS — J188 Other pneumonia, unspecified organism: Secondary | ICD-10-CM | POA: Diagnosis not present

## 2018-11-10 DIAGNOSIS — J90 Pleural effusion, not elsewhere classified: Secondary | ICD-10-CM | POA: Diagnosis not present

## 2018-11-10 DIAGNOSIS — C220 Liver cell carcinoma: Secondary | ICD-10-CM | POA: Diagnosis not present

## 2018-11-10 DIAGNOSIS — S0181XA Laceration without foreign body of other part of head, initial encounter: Secondary | ICD-10-CM | POA: Diagnosis not present

## 2018-11-10 DIAGNOSIS — R262 Difficulty in walking, not elsewhere classified: Secondary | ICD-10-CM | POA: Diagnosis not present

## 2018-11-10 DIAGNOSIS — R296 Repeated falls: Secondary | ICD-10-CM | POA: Diagnosis not present

## 2018-11-10 DIAGNOSIS — S134XXA Sprain of ligaments of cervical spine, initial encounter: Secondary | ICD-10-CM | POA: Diagnosis not present

## 2018-11-10 DIAGNOSIS — K7460 Unspecified cirrhosis of liver: Secondary | ICD-10-CM | POA: Diagnosis not present

## 2018-11-10 DIAGNOSIS — K7469 Other cirrhosis of liver: Secondary | ICD-10-CM | POA: Diagnosis not present

## 2018-11-10 DIAGNOSIS — S0093XA Contusion of unspecified part of head, initial encounter: Secondary | ICD-10-CM | POA: Diagnosis not present

## 2018-11-10 DIAGNOSIS — S0101XA Laceration without foreign body of scalp, initial encounter: Secondary | ICD-10-CM | POA: Diagnosis not present

## 2018-11-10 DIAGNOSIS — Z853 Personal history of malignant neoplasm of breast: Secondary | ICD-10-CM | POA: Diagnosis not present

## 2018-11-10 DIAGNOSIS — J910 Malignant pleural effusion: Secondary | ICD-10-CM | POA: Diagnosis not present

## 2018-11-10 DIAGNOSIS — K7581 Nonalcoholic steatohepatitis (NASH): Secondary | ICD-10-CM | POA: Diagnosis not present

## 2018-11-10 DIAGNOSIS — E785 Hyperlipidemia, unspecified: Secondary | ICD-10-CM | POA: Diagnosis not present

## 2018-11-10 DIAGNOSIS — R410 Disorientation, unspecified: Secondary | ICD-10-CM | POA: Diagnosis not present

## 2018-11-10 DIAGNOSIS — S199XXA Unspecified injury of neck, initial encounter: Secondary | ICD-10-CM | POA: Diagnosis not present

## 2018-11-10 DIAGNOSIS — S098XXA Other specified injuries of head, initial encounter: Secondary | ICD-10-CM | POA: Diagnosis not present

## 2018-11-10 DIAGNOSIS — I509 Heart failure, unspecified: Secondary | ICD-10-CM | POA: Diagnosis not present

## 2018-11-10 DIAGNOSIS — E119 Type 2 diabetes mellitus without complications: Secondary | ICD-10-CM | POA: Diagnosis not present

## 2018-11-14 DIAGNOSIS — Z853 Personal history of malignant neoplasm of breast: Secondary | ICD-10-CM | POA: Diagnosis not present

## 2018-11-14 DIAGNOSIS — K7469 Other cirrhosis of liver: Secondary | ICD-10-CM | POA: Diagnosis not present

## 2018-11-14 DIAGNOSIS — K7581 Nonalcoholic steatohepatitis (NASH): Secondary | ICD-10-CM | POA: Diagnosis not present

## 2018-11-14 DIAGNOSIS — C220 Liver cell carcinoma: Secondary | ICD-10-CM | POA: Diagnosis not present

## 2018-11-14 DIAGNOSIS — J910 Malignant pleural effusion: Secondary | ICD-10-CM | POA: Diagnosis not present

## 2018-11-14 DIAGNOSIS — E119 Type 2 diabetes mellitus without complications: Secondary | ICD-10-CM | POA: Diagnosis not present

## 2018-11-15 DIAGNOSIS — C220 Liver cell carcinoma: Secondary | ICD-10-CM | POA: Diagnosis not present

## 2018-11-15 DIAGNOSIS — I10 Essential (primary) hypertension: Secondary | ICD-10-CM | POA: Diagnosis not present

## 2018-11-15 DIAGNOSIS — E119 Type 2 diabetes mellitus without complications: Secondary | ICD-10-CM | POA: Diagnosis not present

## 2018-11-15 DIAGNOSIS — E039 Hypothyroidism, unspecified: Secondary | ICD-10-CM | POA: Diagnosis not present

## 2018-11-15 DIAGNOSIS — Z853 Personal history of malignant neoplasm of breast: Secondary | ICD-10-CM | POA: Diagnosis not present

## 2018-11-15 DIAGNOSIS — K7469 Other cirrhosis of liver: Secondary | ICD-10-CM | POA: Diagnosis not present

## 2018-11-15 DIAGNOSIS — K7581 Nonalcoholic steatohepatitis (NASH): Secondary | ICD-10-CM | POA: Diagnosis not present

## 2018-11-15 DIAGNOSIS — J910 Malignant pleural effusion: Secondary | ICD-10-CM | POA: Diagnosis not present

## 2018-11-16 DIAGNOSIS — J910 Malignant pleural effusion: Secondary | ICD-10-CM | POA: Diagnosis not present

## 2018-11-16 DIAGNOSIS — Z853 Personal history of malignant neoplasm of breast: Secondary | ICD-10-CM | POA: Diagnosis not present

## 2018-11-16 DIAGNOSIS — E119 Type 2 diabetes mellitus without complications: Secondary | ICD-10-CM | POA: Diagnosis not present

## 2018-11-16 DIAGNOSIS — C220 Liver cell carcinoma: Secondary | ICD-10-CM | POA: Diagnosis not present

## 2018-11-16 DIAGNOSIS — K7581 Nonalcoholic steatohepatitis (NASH): Secondary | ICD-10-CM | POA: Diagnosis not present

## 2018-11-16 DIAGNOSIS — K7469 Other cirrhosis of liver: Secondary | ICD-10-CM | POA: Diagnosis not present

## 2018-11-19 DIAGNOSIS — K7469 Other cirrhosis of liver: Secondary | ICD-10-CM | POA: Diagnosis not present

## 2018-11-19 DIAGNOSIS — J910 Malignant pleural effusion: Secondary | ICD-10-CM | POA: Diagnosis not present

## 2018-11-19 DIAGNOSIS — Z853 Personal history of malignant neoplasm of breast: Secondary | ICD-10-CM | POA: Diagnosis not present

## 2018-11-19 DIAGNOSIS — E119 Type 2 diabetes mellitus without complications: Secondary | ICD-10-CM | POA: Diagnosis not present

## 2018-11-19 DIAGNOSIS — C220 Liver cell carcinoma: Secondary | ICD-10-CM | POA: Diagnosis not present

## 2018-11-19 DIAGNOSIS — K7581 Nonalcoholic steatohepatitis (NASH): Secondary | ICD-10-CM | POA: Diagnosis not present

## 2018-11-20 DIAGNOSIS — J910 Malignant pleural effusion: Secondary | ICD-10-CM | POA: Diagnosis not present

## 2018-11-20 DIAGNOSIS — K7469 Other cirrhosis of liver: Secondary | ICD-10-CM | POA: Diagnosis not present

## 2018-11-20 DIAGNOSIS — K7581 Nonalcoholic steatohepatitis (NASH): Secondary | ICD-10-CM | POA: Diagnosis not present

## 2018-11-20 DIAGNOSIS — E119 Type 2 diabetes mellitus without complications: Secondary | ICD-10-CM | POA: Diagnosis not present

## 2018-11-20 DIAGNOSIS — C220 Liver cell carcinoma: Secondary | ICD-10-CM | POA: Diagnosis not present

## 2018-11-20 DIAGNOSIS — Z853 Personal history of malignant neoplasm of breast: Secondary | ICD-10-CM | POA: Diagnosis not present

## 2018-11-26 DIAGNOSIS — K7469 Other cirrhosis of liver: Secondary | ICD-10-CM | POA: Diagnosis not present

## 2018-11-26 DIAGNOSIS — E119 Type 2 diabetes mellitus without complications: Secondary | ICD-10-CM | POA: Diagnosis not present

## 2018-11-26 DIAGNOSIS — Z853 Personal history of malignant neoplasm of breast: Secondary | ICD-10-CM | POA: Diagnosis not present

## 2018-11-26 DIAGNOSIS — J910 Malignant pleural effusion: Secondary | ICD-10-CM | POA: Diagnosis not present

## 2018-11-26 DIAGNOSIS — C220 Liver cell carcinoma: Secondary | ICD-10-CM | POA: Diagnosis not present

## 2018-11-26 DIAGNOSIS — K7581 Nonalcoholic steatohepatitis (NASH): Secondary | ICD-10-CM | POA: Diagnosis not present

## 2018-12-04 DIAGNOSIS — Z853 Personal history of malignant neoplasm of breast: Secondary | ICD-10-CM | POA: Diagnosis not present

## 2018-12-04 DIAGNOSIS — J910 Malignant pleural effusion: Secondary | ICD-10-CM | POA: Diagnosis not present

## 2018-12-04 DIAGNOSIS — C220 Liver cell carcinoma: Secondary | ICD-10-CM | POA: Diagnosis not present

## 2018-12-04 DIAGNOSIS — K7469 Other cirrhosis of liver: Secondary | ICD-10-CM | POA: Diagnosis not present

## 2018-12-04 DIAGNOSIS — K7581 Nonalcoholic steatohepatitis (NASH): Secondary | ICD-10-CM | POA: Diagnosis not present

## 2018-12-04 DIAGNOSIS — E119 Type 2 diabetes mellitus without complications: Secondary | ICD-10-CM | POA: Diagnosis not present

## 2018-12-05 DIAGNOSIS — K7581 Nonalcoholic steatohepatitis (NASH): Secondary | ICD-10-CM | POA: Diagnosis not present

## 2018-12-05 DIAGNOSIS — J910 Malignant pleural effusion: Secondary | ICD-10-CM | POA: Diagnosis not present

## 2018-12-05 DIAGNOSIS — Z853 Personal history of malignant neoplasm of breast: Secondary | ICD-10-CM | POA: Diagnosis not present

## 2018-12-05 DIAGNOSIS — C220 Liver cell carcinoma: Secondary | ICD-10-CM | POA: Diagnosis not present

## 2018-12-05 DIAGNOSIS — K7469 Other cirrhosis of liver: Secondary | ICD-10-CM | POA: Diagnosis not present

## 2018-12-05 DIAGNOSIS — E119 Type 2 diabetes mellitus without complications: Secondary | ICD-10-CM | POA: Diagnosis not present

## 2018-12-07 DIAGNOSIS — Z853 Personal history of malignant neoplasm of breast: Secondary | ICD-10-CM | POA: Diagnosis not present

## 2018-12-07 DIAGNOSIS — J910 Malignant pleural effusion: Secondary | ICD-10-CM | POA: Diagnosis not present

## 2018-12-07 DIAGNOSIS — K7581 Nonalcoholic steatohepatitis (NASH): Secondary | ICD-10-CM | POA: Diagnosis not present

## 2018-12-07 DIAGNOSIS — C220 Liver cell carcinoma: Secondary | ICD-10-CM | POA: Diagnosis not present

## 2018-12-07 DIAGNOSIS — E119 Type 2 diabetes mellitus without complications: Secondary | ICD-10-CM | POA: Diagnosis not present

## 2018-12-07 DIAGNOSIS — K7469 Other cirrhosis of liver: Secondary | ICD-10-CM | POA: Diagnosis not present

## 2018-12-08 DIAGNOSIS — C220 Liver cell carcinoma: Secondary | ICD-10-CM | POA: Diagnosis not present

## 2018-12-08 DIAGNOSIS — E119 Type 2 diabetes mellitus without complications: Secondary | ICD-10-CM | POA: Diagnosis not present

## 2018-12-08 DIAGNOSIS — K7469 Other cirrhosis of liver: Secondary | ICD-10-CM | POA: Diagnosis not present

## 2018-12-08 DIAGNOSIS — Z853 Personal history of malignant neoplasm of breast: Secondary | ICD-10-CM | POA: Diagnosis not present

## 2018-12-08 DIAGNOSIS — K7581 Nonalcoholic steatohepatitis (NASH): Secondary | ICD-10-CM | POA: Diagnosis not present

## 2018-12-08 DIAGNOSIS — J910 Malignant pleural effusion: Secondary | ICD-10-CM | POA: Diagnosis not present

## 2018-12-16 DEATH — deceased

## 2019-10-29 IMAGING — CT CT GUIDANCE TISSUE ABLATION
1 of 18 series · 2 of 32 positions shown, 5 images · non-contrast
Comparison: MRI of the abdomen on 01/18/2017

CLINICAL DATA: History of cirrhosis and hepatocellular carcinoma
and status post prior thermal ablation of a segment 4A
hepatocellular carcinoma on 03/11/2016. Rising AFP during follow-up
with imaging demonstrating focus of recurrent enhancing tumor along
the anterior and inferior margin of prior ablation. The patient
presents for repeat thermal ablation to treat the area of recurrent
HCC.

EXAM:
CT-GUIDED PERCUTANEOUS THERMAL ABLATION OF LIVER

[Series 2: i-spiral 5.0 b31f · axial · 0.96mm/px · z∈[-98,-42]mm · 2 of 50 slices shown, 5 images]
[im 17/50  soft-tissue]
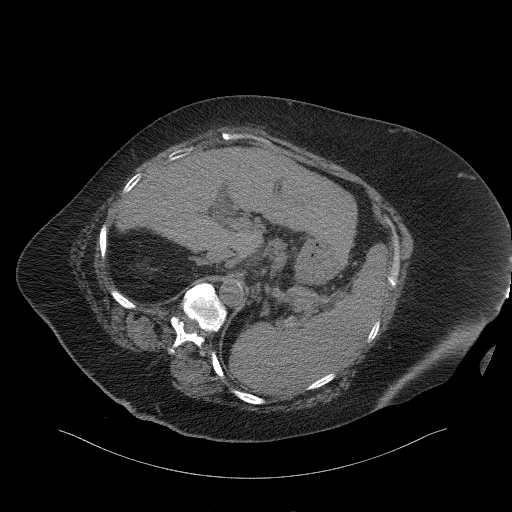
[im 17/50  lung]
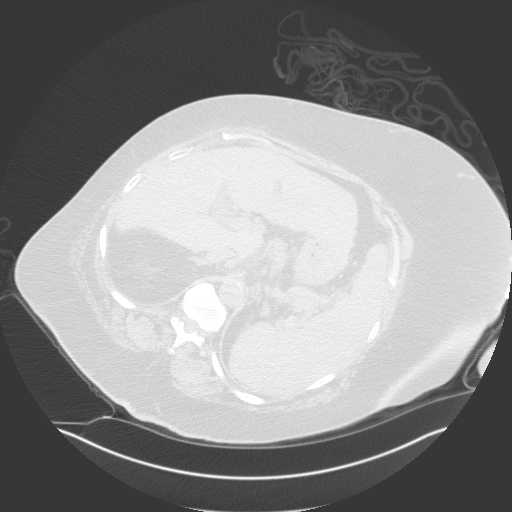
[im 17/50  bone]
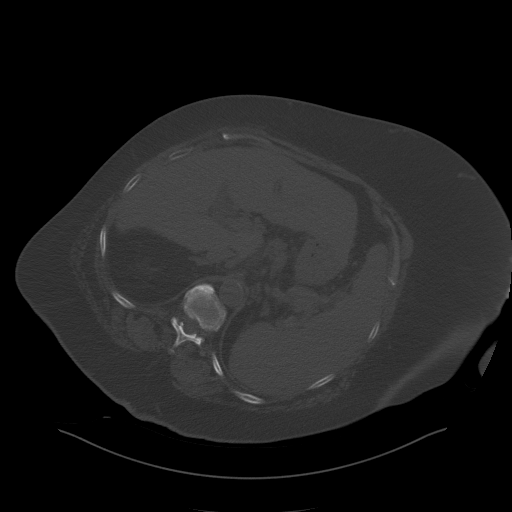
[im 33/50  soft-tissue]
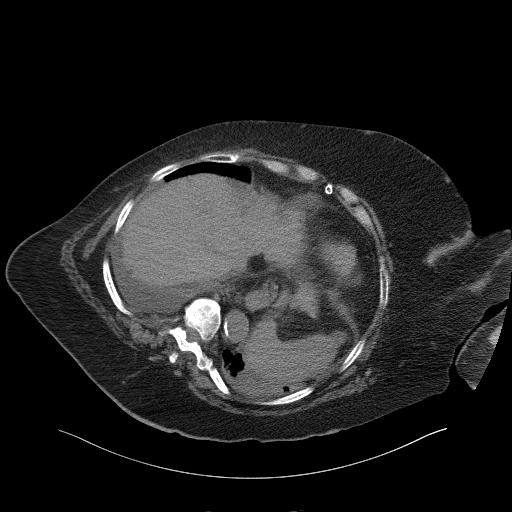
[im 33/50  lung]
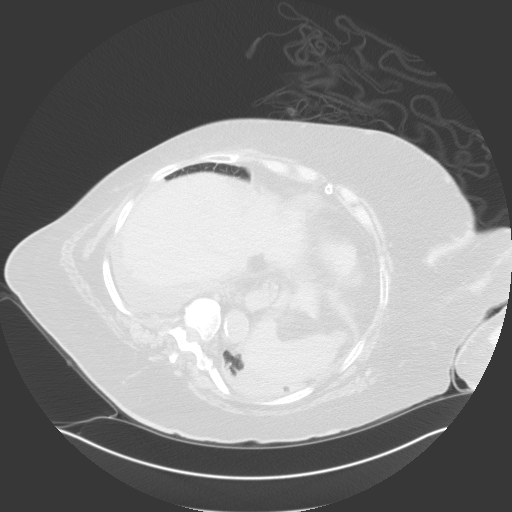

[2 of 32 positions shown; findings below may reference images not displayed]

ANESTHESIA/SEDATION:
Anesthesia:  General

MEDICATIONS:
3.375 g IV Zosyn. Prior to the procedure, the patient also received
a 1 unit transfusion of platelets.

CONTRAST:  75 mL Isovue 370 IV

PROCEDURE:
The procedure, risks, benefits, and alternatives were explained to
the patient. Questions regarding the procedure were encouraged and
answered. The patient understands and consents to the procedure. A
time-out was performed prior to initiating the procedure.

The patient was placed under general anesthesia. Initial unenhanced
CT was performed in a supine position with the right side rolled up
slightly. The abdominal wall was prepped with chlorhexidine in a
sterile fashion, and a sterile drape was applied covering the
operative field. A sterile gown and sterile gloves were used for the
procedure.

Under CT guidance, a 17 cm length, 17 G NeuWave PR percutaneous
thermal ablation probe was advanced into the liver. Probe
positioning was confirmed by CT prior to ablation.

Thermal ablation was performed through the single probe. A 7 minute
cycle of thermal ablation was performed at 65 W. Post-procedural CT
was performed. The ablation probe was then removed as the tract was
cauterized.

COMPLICATIONS:
None
FINDINGS: Focal area of subtle enhancement inferior to the previous ablation
defect was localized after administration of contrast. A single
ablation probe was advanced from a lateral approach to the level of
abnormal enhancement.
IMPRESSION: CT guided percutaneous thermal ablation of left lobe hepatocellular
carcinoma recurrence. The patient will be observed overnight.
Initial follow-up will be performed in approximately 4 weeks.
